# Patient Record
Sex: Female | Born: 1935 | Race: White | Hispanic: No | Smoking: Never smoker
Health system: Southern US, Community
[De-identification: ages and names within clinical notes are randomized; demographics above are authoritative.]

## PROBLEM LIST (undated history)

## (undated) DIAGNOSIS — Z8719 Personal history of other diseases of the digestive system: Secondary | ICD-10-CM

## (undated) DIAGNOSIS — C679 Malignant neoplasm of bladder, unspecified: Secondary | ICD-10-CM

## (undated) DIAGNOSIS — K5792 Diverticulitis of intestine, part unspecified, without perforation or abscess without bleeding: Secondary | ICD-10-CM

## (undated) DIAGNOSIS — R030 Elevated blood-pressure reading, without diagnosis of hypertension: Secondary | ICD-10-CM

## (undated) DIAGNOSIS — K219 Gastro-esophageal reflux disease without esophagitis: Secondary | ICD-10-CM

## (undated) DIAGNOSIS — I10 Essential (primary) hypertension: Secondary | ICD-10-CM

## (undated) HISTORY — DX: Gastro-esophageal reflux disease without esophagitis: K21.9

## (undated) HISTORY — PX: APPENDECTOMY: SHX54

## (undated) HISTORY — PX: ABDOMINAL HYSTERECTOMY: SHX81

---
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
## 1972-04-23 HISTORY — PX: DIAGNOSTIC LAPAROSCOPY: SUR761

## 1976-04-23 HISTORY — PX: ABDOMINAL HYSTERECTOMY: SHX81

## 2013-10-10 ENCOUNTER — Emergency Department (HOSPITAL_BASED_OUTPATIENT_CLINIC_OR_DEPARTMENT_OTHER): Payer: Medicare HMO

## 2013-10-10 ENCOUNTER — Emergency Department (HOSPITAL_BASED_OUTPATIENT_CLINIC_OR_DEPARTMENT_OTHER)
Admission: EM | Admit: 2013-10-10 | Discharge: 2013-10-10 | Disposition: A | Discharge status: Home / Self Care | Payer: Medicare HMO | Attending: Emergency Medicine | Admitting: Emergency Medicine

## 2013-10-10 ENCOUNTER — Encounter (HOSPITAL_BASED_OUTPATIENT_CLINIC_OR_DEPARTMENT_OTHER): Payer: Self-pay | Admitting: Emergency Medicine

## 2013-10-10 DIAGNOSIS — X500XXA Overexertion from strenuous movement or load, initial encounter: Secondary | ICD-10-CM | POA: Insufficient documentation

## 2013-10-10 DIAGNOSIS — S82899A Other fracture of unspecified lower leg, initial encounter for closed fracture: Secondary | ICD-10-CM | POA: Insufficient documentation

## 2013-10-10 DIAGNOSIS — Y9302 Activity, running: Secondary | ICD-10-CM | POA: Insufficient documentation

## 2013-10-10 DIAGNOSIS — S82401A Unspecified fracture of shaft of right fibula, initial encounter for closed fracture: Secondary | ICD-10-CM

## 2013-10-10 DIAGNOSIS — Y929 Unspecified place or not applicable: Secondary | ICD-10-CM | POA: Insufficient documentation

## 2013-10-10 HISTORY — DX: Unspecified fracture of shaft of right fibula, initial encounter for closed fracture: S82.401A

## 2013-10-10 MED ORDER — OXYCODONE-ACETAMINOPHEN 5-325 MG PO TABS
2.0000 | ORAL_TABLET | Freq: Once | ORAL | Status: AC
  Administered 2013-10-10: 1 via ORAL
  Filled 2013-10-10: qty 2

## 2013-10-10 MED ORDER — OXYCODONE-ACETAMINOPHEN 5-325 MG PO TABS: 2.0000 | ORAL_TABLET | ORAL | Status: DC | PRN

## 2013-10-10 NOTE — Discharge Instructions (Signed)
Cryotherapy Cryotherapy is when you put ice on your injury. Ice helps lessen pain and puffiness (swelling) after an injury. Ice works the best when you start using it in the first 24 to 48 hours after an injury. HOME CARE  Put a dry or damp towel between the ice pack and your skin.  You may press gently on the ice pack.  Leave the ice on for no more than 10 to 20 minutes at a time.  Check your skin after 5 minutes to make sure your skin is okay.  Rest at least 20 minutes between ice pack uses.  Stop using ice when your skin loses feeling (numbness).  Do not use ice on someone who cannot tell you when it hurts. This includes small children and people with memory problems (dementia). GET HELP RIGHT AWAY IF:  You have white spots on your skin.  Your skin turns blue or pale.  Your skin feels waxy or hard.  Your puffiness gets worse. MAKE SURE YOU:   Understand these instructions.  Will watch your condition.  Will get help right away if you are not doing well or get worse. Document Released: 09/26/2007 Document Revised: 07/02/2011 Document Reviewed: 11/30/2010 [facility] Patient Information 2015 [place], [place]. This information is not intended to replace advice given to you by your health care provider. Make sure you discuss any questions you have with your health care provider. Fibular Fracture, Ankle, Adult, Treated With or Without Immobilization A fibular fracture at your ankle is a break (fracture) bone in the smallest of the two bones in your lower leg, located on the outside of your leg (fibula) close to the area at your ankle joint. CAUSES  Rolling your ankle.  Twisting your ankle.  Extreme flexing or extending of your foot.  Severe force on your ankle as when falling from a distance. RISK FACTORS  Jumping activities.  Participation in sports.  Osteoporosis.  Advanced age.  Previous ankle injuries. SIGNS AND SYMPTOMS  Pain.  Swelling.  Inability to put  weight on injured ankle.  Bruising.  Bone deformities at site of injury. DIAGNOSIS  This fracture is diagnosed with the help of an X-Blandina Renaldo exam. TREATMENT  If the fractured bone did not move out of place it usually will heal without problems and does casting or splinting. If immobilization is needed for comfort or the fractured bone moved out of place and will not heal properly with immobilization, a cast or splint will be used. HOME CARE INSTRUCTIONS   Apply ice to the area of injury:  Put ice in a plastic bag.  Place a towel between your skin and the bag.  Leave the ice on for 20 minutes, 2-3 times a day.  Use crutches as directed. Resume walking without crutches as directed by your health care provider.  Only take over-the-counter or prescription medicines for pain, discomfort, or fever as directed by your health care provider.  If you have a removable splint or boot, do not remove the boot unless directed by your health care provider. SEEK MEDICAL CARE IF:   You have continued pain or more swelling  The medications do not control the pain. SEEK IMMEDIATE MEDICAL CARE IF:  You develop severe pain in the leg or foot.  Your skin or nails below the injury turn blue or grey or feel cold or numb. MAKE SURE YOU:   Understand these instructions.  Will watch your condition.  Will get help right away if you are not doing well  or get worse. Document Released: 04/09/2005 Document Revised: 01/28/2013 Document Reviewed: 11/19/2012 [facility] Patient Information 2015 [place], [place]. This information is not intended to replace advice given to you by your health care provider. Make sure you discuss any questions you have with your health care provider.

## 2013-10-10 NOTE — ED Provider Notes (Signed)
CSN: [id number]     Arrival date & time 10/10/13  1659 History   First MD Initiated Contact with Patient 10/10/13 1901     Chief Complaint  Patient presents with  . Fall  . Ankle Pain     (Consider location/radiation/quality/duration/timing/severity/associated sxs/prior Treatment) HPI  80 year Old female who was running to get out of the rain when she twisted her right ankle. She has swelling and pain to the lateral aspect of the right ankle. She has no other injury. It is worse with trying to bear weight on it. There is no trauma to the skin. She denies any previous medical history. She took ibuprofen prior to evaluation. She states the pain is worse when tried to walk on it and is not severe when lying with that elevated.     No past medical history on file. Past Surgical History  Procedure Laterality Date  . Abdominal hysterectomy    . Appendectomy     No family history on file. History  Substance Use Topics  . Smoking status: Never Smoker   . Smokeless tobacco: Not on file  . Alcohol Use: Yes   OB History   Grav Para Term Preterm Abortions TAB SAB Ect Mult Living                 Review of Systems  All other systems reviewed and are negative.     Allergies  Review of patient's allergies indicates no known allergies.  Home Medications   Prior to Admission medications   Not on File   BP 190/77  Pulse 84  Temp(Src) 98.3 F (36.8 C) (Oral)  Resp 18  SpO2 95% Physical Exam  Nursing note and vitals reviewed. Constitutional: She is oriented to person, place, and time. She appears well-developed and well-nourished.  HENT:  Head: Normocephalic and atraumatic.  Right Ear: External ear normal.  Left Ear: External ear normal.  Nose: Nose normal.  Mouth/Throat: Oropharynx is clear and moist.  Eyes: Pupils are equal, round, and reactive to light.  Neck: Normal range of motion.  Cardiovascular: Normal rate.   Pulmonary/Chest: Effort normal.  Abdominal: Soft.   Musculoskeletal:  Right ankle with swelling and tenderness palpation on lateral aspect. Dorsal pedalis pulses are intact. Toes are pink with capillary refill intact. Sensation is intact. Knee exam is normal with full active range of motion.  Neurological: She is alert and oriented to person, place, and time. She has normal reflexes.  Skin: Skin is warm and dry.  Psychiatric: She has a normal mood and affect. Her behavior is normal. Judgment and thought content normal.    ED Course  Procedures (including critical care time) Labs Review Labs Reviewed - No data to display  Imaging Review Dg Ankle Complete Right  10/10/2013   CLINICAL DATA:  Twisted right ankle  EXAM: RIGHT ANKLE - COMPLETE 3+ VIEW  COMPARISON:  None  FINDINGS: There is a oblique fracture through the distal fibula. The ankle mortise intact. Talar dome is normal. There is soft tissue swelling over the lateral malleolus.  IMPRESSION: Oblique fracture of the distal fibula.  No dislocation.   Electronically Signed   By: Suzy Bouchard M.D.   On: 10/10/2013 17:58     EKG Interpretation None      MDM   Final diagnoses:  Closed fracture of right fibula, initial encounter        Shaune Pollack, MD 10/10/13 1930

## 2013-10-10 NOTE — ED Notes (Signed)
Pt twisted right ankle while wearing flip-flops in the rain today. Reports pain in right ankle and leg

## 2013-10-10 NOTE — ED Notes (Signed)
Rx x 1 given for percocet and for a walker- d/c home with family

## 2013-10-10 NOTE — ED Notes (Signed)
I applied a short leg posterior splint with a stirrup application for added support. I used sock material, then thick wrap of cotton webril, then fiberglass material. I wrapped large kerlix to hold all in place, then used ace wrap to secure. I held foot/ankle in proper anatomical position until fiberglass set up. I fit, adjusted, and completed crutch training. Patient ready for discharge.

## 2013-10-14 ENCOUNTER — Ambulatory Visit (INDEPENDENT_AMBULATORY_CARE_PROVIDER_SITE_OTHER): Payer: Medicare HMO | Admitting: Family Medicine

## 2013-10-14 ENCOUNTER — Encounter: Payer: Self-pay | Admitting: Family Medicine

## 2013-10-14 VITALS — BP 168/100 | HR 91 | Ht 63.0 in | Wt 158.0 lb

## 2013-10-14 DIAGNOSIS — S82899A Other fracture of unspecified lower leg, initial encounter for closed fracture: Secondary | ICD-10-CM

## 2013-10-14 DIAGNOSIS — S82839A Other fracture of upper and lower end of unspecified fibula, initial encounter for closed fracture: Secondary | ICD-10-CM

## 2013-10-14 NOTE — Patient Instructions (Signed)
Wear cam walker regularly except when icing, bathing (ok to take off if you're lying on the couch and propping this up too). Icing 15 minutes at a time 3-4 times a day. Advil, percocet as you have been. Follow up with me in 2 weeks for reevaluation, to repeat x-rays. Do not put any weight on this leg for at least 6 weeks.

## 2013-10-19 ENCOUNTER — Encounter: Payer: Self-pay | Admitting: Family Medicine

## 2013-10-19 DIAGNOSIS — S82839A Other fracture of upper and lower end of unspecified fibula, initial encounter for closed fracture: Secondary | ICD-10-CM | POA: Insufficient documentation

## 2013-10-19 NOTE — Assessment & Plan Note (Signed)
Right distal fibula fracture - discussed this is concerning it is above the level of the ankle joint - higher risk of nonunion.  Switch to cam walker and advised not to weight bear.  Icing, elevation, nsaids with percocet as needed. Has a wheelchair - plans to use this.  F/u in 2 weeks to reevaluate, repeat x-rays.

## 2013-10-19 NOTE — Progress Notes (Signed)
Patient ID: Brandy Wheeler, female   DOB: 12-14-1931, 80 y.o.   MRN: [id number]  PCP: No PCP Per Patient  Subjective:   HPI: Patient is a 80 y.o. female here for right leg injury.  Patient reports she was in the garage on 6/20 when she slipped in flip flops on a wet floor and inverted her right ankle. Couldn't bear weight after this. Went to ED - radiographs showed a distal fibula spiral fracture above the level of the ankle joint. + swelling and bruising. Taking advil, wearing splint and not weight bearing. Given percocet but only took a few of these. No prior injuries to this part of leg, ankle.  Past Medical History  Diagnosis Date  . GERD (gastroesophageal reflux disease)     Current Outpatient Prescriptions on File Prior to Visit  Medication Sig Dispense Refill  . oxyCODONE-acetaminophen (PERCOCET/ROXICET) 5-325 MG per tablet Take 2 tablets by mouth every 4 (four) hours as needed for severe pain.  15 tablet  0   No current facility-administered medications on file prior to visit.    Past Surgical History  Procedure Laterality Date  . Abdominal hysterectomy    . Appendectomy      No Known Allergies  History   Social History  . Marital Status: Divorced    Spouse Name: N/A    Number of Children: N/A  . Years of Education: N/A   Occupational History  . Not on file.   Social History Main Topics  . Smoking status: Never Smoker   . Smokeless tobacco: Not on file  . Alcohol Use: Yes  . Drug Use: No  . Sexual Activity: Not on file   Other Topics Concern  . Not on file   Social History Narrative  . No narrative on file    No family history on file.  BP 168/100  Pulse 91  Ht 5\' 3"  (1.6 m)  Wt 158 lb (71.668 kg)  BMI 28.00 kg/m2  Review of Systems: See HPI above.    Objective:  Physical Exam:  Gen: NAD  Right ankle/lower leg: Mild swelling bruising.  No other deformity. Mild limitation ROM all directions. TTP distal fibula.  No other  tenderness. Negative ant drawer and talar tilt.   Pain with syndesmotic compression. Thompsons test negative. NV intact distally.    Assessment & Plan:  1. Right distal fibula fracture - discussed this is concerning it is above the level of the ankle joint - higher risk of nonunion.  Switch to cam walker and advised not to weight bear.  Icing, elevation, nsaids with percocet as needed. Has a wheelchair - plans to use this.  F/u in 2 weeks to reevaluate, repeat x-rays.

## 2013-10-27 ENCOUNTER — Ambulatory Visit (INDEPENDENT_AMBULATORY_CARE_PROVIDER_SITE_OTHER): Payer: Medicare HMO | Admitting: Family Medicine

## 2013-10-27 ENCOUNTER — Ambulatory Visit (HOSPITAL_BASED_OUTPATIENT_CLINIC_OR_DEPARTMENT_OTHER)
Admission: RE | Admit: 2013-10-27 | Discharge: 2013-10-27 | Disposition: A | Discharge status: Home / Self Care | Payer: Medicare HMO | Source: Ambulatory Visit | Attending: Family Medicine | Admitting: Family Medicine

## 2013-10-27 ENCOUNTER — Encounter: Payer: Self-pay | Admitting: Family Medicine

## 2013-10-27 VITALS — BP 168/90 | HR 92 | Ht 63.0 in | Wt 158.0 lb

## 2013-10-27 DIAGNOSIS — S82839A Other fracture of upper and lower end of unspecified fibula, initial encounter for closed fracture: Secondary | ICD-10-CM

## 2013-10-27 DIAGNOSIS — S82409A Unspecified fracture of shaft of unspecified fibula, initial encounter for closed fracture: Secondary | ICD-10-CM | POA: Insufficient documentation

## 2013-10-27 DIAGNOSIS — X58XXXA Exposure to other specified factors, initial encounter: Secondary | ICD-10-CM | POA: Insufficient documentation

## 2013-10-27 DIAGNOSIS — S82899A Other fracture of unspecified lower leg, initial encounter for closed fracture: Secondary | ICD-10-CM

## 2013-10-27 NOTE — Assessment & Plan Note (Signed)
Right distal fibula fracture - discussed this is concerning it is above the level of the ankle joint - higher risk of nonunion.  Repeat radiographs unchanged.  Much better clinically.  Discussed risks of not using the cam walker or a cast - increased risk of reinjury, possible surgery.  She would like to go without one though because it is causing pain (I believe cast would have similar issue).  Continue non weight bearing status.  Icing, elevation, nsaids as needed.   F/u in 4 weeks, repeat radiographs.

## 2013-10-27 NOTE — Patient Instructions (Signed)
Follow up with me in 4 weeks. No weight bearing still until you see me back. Icing as needed.  Elevation as needed for swelling.

## 2013-10-27 NOTE — Progress Notes (Signed)
Patient ID: Brandy Wheeler, female   DOB: 11/25/1931, 80 y.o.   MRN: [id number]  PCP: No PCP Per Patient  Subjective:   HPI: Patient is a 80 y.o. female here for right leg injury.  6/24: Patient reports she was in the garage on 6/20 when she slipped in flip flops on a wet floor and inverted her right ankle. Couldn't bear weight after this. Went to ED - radiographs showed a distal fibula spiral fracture above the level of the ankle joint. + swelling and bruising. Taking advil, wearing splint and not weight bearing. Given percocet but only took a few of these. No prior injuries to this part of leg, ankle.  7/7: Patient reports her leg feels much better. Still dealing with swelling. Back starting to bother her when wearing cam walker so stopped using this. Not weight bearing. Using wheelchair. Not needing pain medication.  Past Medical History  Diagnosis Date  . GERD (gastroesophageal reflux disease)     Current Outpatient Prescriptions on File Prior to Visit  Medication Sig Dispense Refill  . omeprazole (PRILOSEC) 10 MG capsule Take 10 mg by mouth daily.      Marland Kitchen oxyCODONE-acetaminophen (PERCOCET/ROXICET) 5-325 MG per tablet Take 2 tablets by mouth every 4 (four) hours as needed for severe pain.  15 tablet  0   No current facility-administered medications on file prior to visit.    Past Surgical History  Procedure Laterality Date  . Abdominal hysterectomy    . Appendectomy      No Known Allergies  History   Social History  . Marital Status: Divorced    Spouse Name: N/A    Number of Children: N/A  . Years of Education: N/A   Occupational History  . Not on file.   Social History Main Topics  . Smoking status: Never Smoker   . Smokeless tobacco: Not on file  . Alcohol Use: Yes  . Drug Use: No  . Sexual Activity: Not on file   Other Topics Concern  . Not on file   Social History Narrative  . No narrative on file    No family history on file.  BP 168/90   Pulse 92  Ht 5\' 3"  (1.6 m)  Wt 158 lb (71.668 kg)  BMI 28.00 kg/m2  Review of Systems: See HPI above.    Objective:  Physical Exam:  Gen: NAD  Right ankle/lower leg: Mild swelling, no bruising.  No other deformity. Mild limitation ROM all directions. Mild TTP distal fibula.  No other tenderness. Negative ant drawer and talar tilt.   Thompsons test negative. NV intact distally.    Assessment & Plan:  1. Right distal fibula fracture - discussed this is concerning it is above the level of the ankle joint - higher risk of nonunion.  Repeat radiographs unchanged.  Much better clinically.  Discussed risks of not using the cam walker or a cast - increased risk of reinjury, possible surgery.  She would like to go without one though because it is causing pain (I believe cast would have similar issue).  Continue non weight bearing status.  Icing, elevation, nsaids as needed.   F/u in 4 weeks, repeat radiographs.

## 2013-11-23 ENCOUNTER — Ambulatory Visit (INDEPENDENT_AMBULATORY_CARE_PROVIDER_SITE_OTHER): Payer: Medicare HMO | Admitting: Family Medicine

## 2013-11-23 ENCOUNTER — Ambulatory Visit (HOSPITAL_BASED_OUTPATIENT_CLINIC_OR_DEPARTMENT_OTHER)
Admission: RE | Admit: 2013-11-23 | Discharge: 2013-11-23 | Disposition: A | Discharge status: Home / Self Care | Payer: Medicare HMO | Source: Ambulatory Visit | Attending: Family Medicine | Admitting: Family Medicine

## 2013-11-23 ENCOUNTER — Encounter: Payer: Self-pay | Admitting: Family Medicine

## 2013-11-23 VITALS — BP 166/81 | HR 90 | Ht 63.0 in | Wt 151.0 lb

## 2013-11-23 DIAGNOSIS — S99911D Unspecified injury of right ankle, subsequent encounter: Secondary | ICD-10-CM

## 2013-11-23 DIAGNOSIS — Z5189 Encounter for other specified aftercare: Secondary | ICD-10-CM | POA: Diagnosis present

## 2013-11-23 DIAGNOSIS — S82839A Other fracture of upper and lower end of unspecified fibula, initial encounter for closed fracture: Secondary | ICD-10-CM

## 2013-11-23 DIAGNOSIS — S99929A Unspecified injury of unspecified foot, initial encounter: Secondary | ICD-10-CM

## 2013-11-23 DIAGNOSIS — S99919A Unspecified injury of unspecified ankle, initial encounter: Secondary | ICD-10-CM

## 2013-11-23 DIAGNOSIS — S8990XA Unspecified injury of unspecified lower leg, initial encounter: Secondary | ICD-10-CM | POA: Diagnosis present

## 2013-11-23 DIAGNOSIS — S82899A Other fracture of unspecified lower leg, initial encounter for closed fracture: Secondary | ICD-10-CM

## 2013-11-23 NOTE — Patient Instructions (Signed)
Wear the wrap or laceup brace for 2 more weeks then as needed beyond this. Call me if you have any issues otherwise follow-up as needed.

## 2013-11-24 ENCOUNTER — Encounter: Payer: Self-pay | Admitting: Family Medicine

## 2013-11-24 NOTE — Assessment & Plan Note (Signed)
Right distal fibula fracture - repeat radiographs show some interval healing.  Additionally she has no pain and minimal tenderness, clinically doing well.  She will wear ACE wrap or ASO for support for 2 more weeks then as needed beyond this. Call with any concerns otherwise f/u prn.

## 2013-11-24 NOTE — Progress Notes (Signed)
Patient ID: Brandy Wheeler, female   DOB: [birth year], 80 y.o.   MRN: [id number]  PCP: No PCP Per Patient  Subjective:   HPI: Patient is a 80 y.o. female here for right leg injury.  6/24: Patient reports she was in the garage on 6/20 when she slipped in flip flops on a wet floor and inverted her right ankle. Couldn't bear weight after this. Went to ED - radiographs showed a distal fibula spiral fracture above the level of the ankle joint. + swelling and bruising. Taking advil, wearing splint and not weight bearing. Given percocet but only took a few of these. No prior injuries to this part of leg, ankle.  7/7: Patient reports her leg feels much better. Still dealing with swelling. Back starting to bother her when wearing cam walker so stopped using this. Not weight bearing. Using wheelchair. Not needing pain medication.  8/3: Patient reports she is doing well. A little weakness but no pain. Some swelling. Continues to wear ace wrap for support. No other complaints. Not taking any medicine for pain.  Past Medical History  Diagnosis Date  . GERD (gastroesophageal reflux disease)     Current Outpatient Prescriptions on File Prior to Visit  Medication Sig Dispense Refill  . omeprazole (PRILOSEC) 10 MG capsule Take 10 mg by mouth daily.      Marland Kitchen oxyCODONE-acetaminophen (PERCOCET/ROXICET) 5-325 MG per tablet Take 2 tablets by mouth every 4 (four) hours as needed for severe pain.  15 tablet  0   No current facility-administered medications on file prior to visit.    Past Surgical History  Procedure Laterality Date  . Abdominal hysterectomy    . Appendectomy      No Known Allergies  History   Social History  . Marital Status: Divorced    Spouse Name: N/A    Number of Children: N/A  . Years of Education: N/A   Occupational History  . Not on file.   Social History Main Topics  . Smoking status: Never Smoker   . Smokeless tobacco: Not on file  . Alcohol Use: Yes  .  Drug Use: No  . Sexual Activity: Not on file   Other Topics Concern  . Not on file   Social History Narrative  . No narrative on file    No family history on file.  BP 166/81  Pulse 90  Ht 5\' 3"  (1.6 m)  Wt 151 lb (68.493 kg)  BMI 26.76 kg/m2  Review of Systems: See HPI above.    Objective:  Physical Exam:  Gen: NAD  Right ankle/lower leg: Mild swelling, no bruising.  No other deformity. FROM. Minimal TTP distal fibula.  No other tenderness. Negative ant drawer and talar tilt.   Thompsons test negative. NV intact distally.    Assessment & Plan:  1. Right distal fibula fracture - repeat radiographs show some interval healing.  Additionally she has no pain and minimal tenderness, clinically doing well.  She will wear ACE wrap or ASO for support for 2 more weeks then as needed beyond this. Call with any concerns otherwise f/u prn.

## 2020-04-20 ENCOUNTER — Emergency Department (HOSPITAL_BASED_OUTPATIENT_CLINIC_OR_DEPARTMENT_OTHER)
Admission: EM | Admit: 2020-04-20 | Discharge: 2020-04-20 | Disposition: A | Discharge status: Home / Self Care | Payer: Medicare HMO | Attending: Emergency Medicine | Admitting: Emergency Medicine

## 2020-04-20 ENCOUNTER — Emergency Department (HOSPITAL_BASED_OUTPATIENT_CLINIC_OR_DEPARTMENT_OTHER): Payer: Medicare HMO

## 2020-04-20 ENCOUNTER — Other Ambulatory Visit: Payer: Self-pay

## 2020-04-20 ENCOUNTER — Encounter (HOSPITAL_BASED_OUTPATIENT_CLINIC_OR_DEPARTMENT_OTHER): Payer: Self-pay | Admitting: Emergency Medicine

## 2020-04-20 DIAGNOSIS — Z8719 Personal history of other diseases of the digestive system: Secondary | ICD-10-CM | POA: Insufficient documentation

## 2020-04-20 DIAGNOSIS — N3090 Cystitis, unspecified without hematuria: Secondary | ICD-10-CM | POA: Insufficient documentation

## 2020-04-20 DIAGNOSIS — R1032 Left lower quadrant pain: Secondary | ICD-10-CM | POA: Diagnosis present

## 2020-04-20 DIAGNOSIS — K219 Gastro-esophageal reflux disease without esophagitis: Secondary | ICD-10-CM | POA: Insufficient documentation

## 2020-04-20 DIAGNOSIS — Z9049 Acquired absence of other specified parts of digestive tract: Secondary | ICD-10-CM | POA: Diagnosis not present

## 2020-04-20 HISTORY — DX: Diverticulitis of intestine, part unspecified, without perforation or abscess without bleeding: K57.92

## 2020-04-20 LAB — CBC
HCT: 46.2 % — ABNORMAL HIGH (ref 36.0–46.0)
Hemoglobin: 14.9 g/dL (ref 12.0–15.0)
MCH: 30.1 pg (ref 26.0–34.0)
MCHC: 32.3 g/dL (ref 30.0–36.0)
MCV: 93.3 fL (ref 80.0–100.0)
Platelets: 227 10*3/uL (ref 150–400)
RBC: 4.95 MIL/uL (ref 3.87–5.11)
RDW: 13.5 % (ref 11.5–15.5)
WBC: 4.8 10*3/uL (ref 4.0–10.5)
nRBC: 0 % (ref 0.0–0.2)

## 2020-04-20 LAB — COMPREHENSIVE METABOLIC PANEL
ALT: 19 U/L (ref 0–44)
AST: 25 U/L (ref 15–41)
Albumin: 3.9 g/dL (ref 3.5–5.0)
Alkaline Phosphatase: 65 U/L (ref 38–126)
Anion gap: 11 (ref 5–15)
BUN: 15 mg/dL (ref 8–23)
CO2: 26 mmol/L (ref 22–32)
Calcium: 8.6 mg/dL — ABNORMAL LOW (ref 8.9–10.3)
Chloride: 100 mmol/L (ref 98–111)
Creatinine, Ser: 0.92 mg/dL (ref 0.44–1.00)
GFR, Estimated: >60 mL/min (ref >60)
Glucose, Bld: 115 mg/dL — ABNORMAL HIGH (ref 70–99)
Potassium: 4.1 mmol/L (ref 3.5–5.1)
Sodium: 137 mmol/L (ref 135–145)
Total Bilirubin: 0.5 mg/dL (ref 0.3–1.2)
Total Protein: 6.9 g/dL (ref 6.5–8.1)

## 2020-04-20 LAB — URINALYSIS, ROUTINE W REFLEX MICROSCOPIC
Bilirubin Urine: NEGATIVE
Glucose, UA: NEGATIVE mg/dL
Hgb urine dipstick: NEGATIVE
Ketones, ur: NEGATIVE mg/dL
Leukocytes,Ua: NEGATIVE
Nitrite: NEGATIVE
Protein, ur: NEGATIVE mg/dL
Specific Gravity, Urine: 1.01 (ref 1.005–1.030)
pH: 6 (ref 5.0–8.0)

## 2020-04-20 LAB — LIPASE, BLOOD: Lipase: 50 U/L (ref 11–51)

## 2020-04-20 MED ORDER — CEPHALEXIN 500 MG PO CAPS
500.0000 mg | ORAL_CAPSULE | Freq: Three times a day (TID) | ORAL | 0 refills | Status: AC
Start: 2020-04-20 — End: 2020-04-27

## 2020-04-20 MED ORDER — IOHEXOL 300 MG/ML  SOLN
100.0000 mL | Freq: Once | INTRAMUSCULAR | Status: AC | PRN
  Administered 2020-04-20: 100 mL via INTRAVENOUS

## 2020-04-20 NOTE — ED Notes (Signed)
Patient transported to CT 

## 2020-04-20 NOTE — Discharge Instructions (Addendum)
You were evaluated in the Emergency Department and after careful evaluation, we did not find any emergent condition requiring admission or further testing in the hospital.  Your exam/testing today was overall reassuring.  Your CT did not show any signs of diverticulitis.  There is some evidence of inflammation of the bladder.  Please take the antibiotics as directed, Tylenol for discomfort.  Follow-up closely with your primary care doctor to discuss your high blood pressure.  Please return to the Emergency Department if you experience any worsening of your condition.  Thank you for allowing [place] to be a part of your care.

## 2020-04-20 NOTE — ED Triage Notes (Signed)
Left sided abd pain x 2 days has hx of diverticulitis states was told she may ed a ct scan some nausea

## 2020-04-20 NOTE — ED Provider Notes (Signed)
[facility] Emergency Department Provider Note MRN:  [id number]  Arrival date & time: 04/20/20     Chief Complaint   Abdominal Pain   History of Present Illness   Brandy Wheeler is a 86 y.o. year-old female with a history of diverticulitis presenting to the ED with chief complaint of abdominal pain.  Location: Left lower quadrant Duration: 2 days Onset: Gradual Timing: Intermittent Description: Dull Severity: Mild to moderate Exacerbating/Alleviating Factors: Worse with motion or palpation Associated Symptoms: Denies fever, no nausea, no vomiting, no diarrhea, no constipation   Review of Systems  A complete 10 system review of systems was obtained and all systems are negative except as noted in the HPI and PMH.   Patient's Health History    Past Medical History:  Diagnosis Date  . Diverticulitis   . GERD (gastroesophageal reflux disease)     Past Surgical History:  Procedure Laterality Date  . ABDOMINAL HYSTERECTOMY    . APPENDECTOMY      No family history on file.  Social History   Socioeconomic History  . Marital status: Divorced    Spouse name: Not on file  . Number of children: Not on file  . Years of education: Not on file  . Highest education level: Not on file  Occupational History  . Not on file  Tobacco Use  . Smoking status: Never Smoker  . Smokeless tobacco: Never Used  Substance and Sexual Activity  . Alcohol use: Yes  . Drug use: No  . Sexual activity: Not on file  Other Topics Concern  . Not on file  Social History Narrative  . Not on file   Social Determinants of Health   Financial Resource Strain: Not on file  Food Insecurity: Not on file  Transportation Needs: Not on file  Physical Activity: Not on file  Stress: Not on file  Social Connections: Not on file  Intimate Partner Violence: Not on file     Physical Exam   Vitals:   04/20/20 0930 04/20/20 1049  BP: (!) 178/81 (!) 200/122  Pulse: 84 86   Resp: 16 16  Temp:    SpO2: 99% 94%    CONSTITUTIONAL: Well-appearing, NAD NEURO:  Alert and oriented x 3, no focal deficits EYES:  eyes equal and reactive ENT/NECK:  no LAD, no JVD CARDIO: Regular rate, well-perfused, normal S1 and S2 PULM:  CTAB no wheezing or rhonchi GI/GU:  normal bowel sounds, non-distended, non-tender MSK/SPINE:  No gross deformities, no edema SKIN:  no rash, atraumatic PSYCH:  Appropriate speech and behavior  *Additional and/or pertinent findings included in MDM below  Diagnostic and Interventional Summary    EKG Interpretation  Date/Time:    Ventricular Rate:    PR Interval:    QRS Duration:   QT Interval:    QTC Calculation:   R Axis:     Text Interpretation:        Labs Reviewed  CBC - Abnormal; Notable for the following components:      Result Value   HCT 46.2 (*)    All other components within normal limits  COMPREHENSIVE METABOLIC PANEL - Abnormal; Notable for the following components:   Glucose, Bld 115 (*)    Calcium 8.6 (*)    All other components within normal limits  URINE CULTURE  LIPASE, BLOOD  URINALYSIS, ROUTINE W REFLEX MICROSCOPIC    CT ABDOMEN PELVIS W CONTRAST  Final Result      Medications  iohexol (OMNIPAQUE) 300  MG/ML solution 100 mL (100 mLs Intravenous Contrast Given 04/20/20 1003)     Procedures  /  Critical Care Procedures  ED Course and Medical Decision Making  I have reviewed the triage vital signs, the nursing notes, and pertinent available records from the EMR.  Listed above are laboratory and imaging tests that I personally ordered, reviewed, and interpreted and then considered in my medical decision making (see below for details).  CT to evaluate for complications of diverticulitis     CT is without evidence of diverticulitis.  There is some stranding near the bladder and right collecting system.  This is peculiar given the normal-appearing urine on urinalysis.  Given her pain and these CT  findings, will cover with antibiotics for cystitis.  Urine sent for culture.  Patient is hypertensive in the emergency department but with no headache or vision change or chest pain, she has not seen a PCP in 3 years, advised close PCP follow-up.  Elmer Sow. Pilar Plate, MD [facility] Emergency Medicine [facility] [email or web address]  Final Clinical Impressions(s) / ED Diagnoses     ICD-10-CM   1. Cystitis  N30.90     ED Discharge Orders         Ordered    cephALEXin (KEFLEX) 500 MG capsule  3 times daily        04/20/20 1107           Discharge Instructions Discussed with and Provided to Patient:     Discharge Instructions     You were evaluated in the Emergency Department and after careful evaluation, we did not find any emergent condition requiring admission or further testing in the hospital.  Your exam/testing today was overall reassuring.  Your CT did not show any signs of diverticulitis.  There is some evidence of inflammation of the bladder.  Please take the antibiotics as directed, Tylenol for discomfort.  Follow-up closely with your primary care doctor to discuss your high blood pressure.  Please return to the Emergency Department if you experience any worsening of your condition.  Thank you for allowing [place] to be a part of your care.        Sabas Sous, MD 04/20/20 1109

## 2020-04-21 LAB — URINE CULTURE: Culture: <10000 — AB

## 2020-04-27 ENCOUNTER — Emergency Department (HOSPITAL_BASED_OUTPATIENT_CLINIC_OR_DEPARTMENT_OTHER)
Admission: EM | Admit: 2020-04-27 | Discharge: 2020-04-27 | Disposition: A | Discharge status: Home / Self Care | Payer: Medicare HMO | Attending: Emergency Medicine | Admitting: Emergency Medicine

## 2020-04-27 ENCOUNTER — Other Ambulatory Visit: Payer: Self-pay

## 2020-04-27 ENCOUNTER — Encounter (HOSPITAL_BASED_OUTPATIENT_CLINIC_OR_DEPARTMENT_OTHER): Payer: Self-pay | Admitting: Emergency Medicine

## 2020-04-27 ENCOUNTER — Emergency Department (HOSPITAL_BASED_OUTPATIENT_CLINIC_OR_DEPARTMENT_OTHER): Payer: Medicare HMO

## 2020-04-27 DIAGNOSIS — R112 Nausea with vomiting, unspecified: Secondary | ICD-10-CM | POA: Insufficient documentation

## 2020-04-27 DIAGNOSIS — Z9049 Acquired absence of other specified parts of digestive tract: Secondary | ICD-10-CM | POA: Diagnosis not present

## 2020-04-27 DIAGNOSIS — U071 COVID-19: Secondary | ICD-10-CM | POA: Diagnosis not present

## 2020-04-27 DIAGNOSIS — N2882 Megaloureter: Secondary | ICD-10-CM | POA: Diagnosis not present

## 2020-04-27 DIAGNOSIS — R1084 Generalized abdominal pain: Secondary | ICD-10-CM | POA: Diagnosis not present

## 2020-04-27 DIAGNOSIS — Z20822 Contact with and (suspected) exposure to covid-19: Secondary | ICD-10-CM

## 2020-04-27 DIAGNOSIS — N3090 Cystitis, unspecified without hematuria: Secondary | ICD-10-CM | POA: Diagnosis not present

## 2020-04-27 DIAGNOSIS — K5730 Diverticulosis of large intestine without perforation or abscess without bleeding: Secondary | ICD-10-CM | POA: Insufficient documentation

## 2020-04-27 DIAGNOSIS — I700 Atherosclerosis of aorta: Secondary | ICD-10-CM | POA: Diagnosis not present

## 2020-04-27 DIAGNOSIS — N2889 Other specified disorders of kidney and ureter: Secondary | ICD-10-CM | POA: Diagnosis not present

## 2020-04-27 LAB — CBC
HCT: 45.2 % (ref 36.0–46.0)
Hemoglobin: 14.9 g/dL (ref 12.0–15.0)
MCH: 30.1 pg (ref 26.0–34.0)
MCHC: 33 g/dL (ref 30.0–36.0)
MCV: 91.3 fL (ref 80.0–100.0)
Platelets: 182 10*3/uL (ref 150–400)
RBC: 4.95 MIL/uL (ref 3.87–5.11)
RDW: 13.2 % (ref 11.5–15.5)
WBC: 4.8 10*3/uL (ref 4.0–10.5)
nRBC: 0 % (ref 0.0–0.2)

## 2020-04-27 LAB — URINALYSIS, ROUTINE W REFLEX MICROSCOPIC
Bilirubin Urine: NEGATIVE
Glucose, UA: NEGATIVE mg/dL
Ketones, ur: 15 mg/dL — AB
Nitrite: NEGATIVE
Protein, ur: 30 mg/dL — AB
Specific Gravity, Urine: 1.025 (ref 1.005–1.030)
pH: 6.5 (ref 5.0–8.0)

## 2020-04-27 LAB — COMPREHENSIVE METABOLIC PANEL
ALT: 16 U/L (ref 0–44)
AST: 26 U/L (ref 15–41)
Albumin: 3.8 g/dL (ref 3.5–5.0)
Alkaline Phosphatase: 63 U/L (ref 38–126)
Anion gap: 10 (ref 5–15)
BUN: 16 mg/dL (ref 8–23)
CO2: 28 mmol/L (ref 22–32)
Calcium: 8.5 mg/dL — ABNORMAL LOW (ref 8.9–10.3)
Chloride: 96 mmol/L — ABNORMAL LOW (ref 98–111)
Creatinine, Ser: 0.9 mg/dL (ref 0.44–1.00)
GFR, Estimated: >60 mL/min (ref >60)
Glucose, Bld: 112 mg/dL — ABNORMAL HIGH (ref 70–99)
Potassium: 3.9 mmol/L (ref 3.5–5.1)
Sodium: 134 mmol/L — ABNORMAL LOW (ref 135–145)
Total Bilirubin: 0.6 mg/dL (ref 0.3–1.2)
Total Protein: 6.8 g/dL (ref 6.5–8.1)

## 2020-04-27 LAB — URINALYSIS, MICROSCOPIC (REFLEX): RBC / HPF: >50 RBC/hpf (ref 0–5)

## 2020-04-27 LAB — LIPASE, BLOOD: Lipase: 45 U/L (ref 11–51)

## 2020-04-27 LAB — SARS CORONAVIRUS 2 (TAT 6-24 HRS): SARS Coronavirus 2: POSITIVE — AB

## 2020-04-27 LAB — LACTIC ACID, PLASMA: Lactic Acid, Venous: 0.9 mmol/L (ref 0.5–1.9)

## 2020-04-27 MED ORDER — SULFAMETHOXAZOLE-TRIMETHOPRIM 800-160 MG PO TABS: 1.0000 | ORAL_TABLET | Freq: Two times a day (BID) | ORAL | 0 refills | Status: AC

## 2020-04-27 MED ORDER — SODIUM CHLORIDE 0.9 % IV SOLN
1.0000 g | Freq: Once | INTRAVENOUS | Status: AC
  Administered 2020-04-27: 1 g via INTRAVENOUS
  Filled 2020-04-27: qty 10

## 2020-04-27 MED ORDER — OXYCODONE-ACETAMINOPHEN 5-325 MG PO TABS
1.0000 | ORAL_TABLET | Freq: Once | ORAL | Status: AC
Start: 2020-04-27 — End: 2020-04-27
  Administered 2020-04-27: 1 via ORAL
  Filled 2020-04-27: qty 1

## 2020-04-27 MED ORDER — FENTANYL CITRATE (PF) 100 MCG/2ML IJ SOLN
50.0000 ug | Freq: Once | INTRAMUSCULAR | Status: AC
  Administered 2020-04-27: 50 ug via INTRAVENOUS
  Filled 2020-04-27: qty 2

## 2020-04-27 MED ORDER — SODIUM CHLORIDE 0.9 % IV SOLN
INTRAVENOUS | Status: DC | PRN
  Administered 2020-04-27: 25 mL via INTRAVENOUS

## 2020-04-27 MED ORDER — SENNOSIDES-DOCUSATE SODIUM 8.6-50 MG PO TABS: 1.0000 | ORAL_TABLET | Freq: Every evening | ORAL | 0 refills | Status: DC | PRN

## 2020-04-27 MED ORDER — IOHEXOL 350 MG/ML SOLN
100.0000 mL | Freq: Once | INTRAVENOUS | Status: AC | PRN
  Administered 2020-04-27: 100 mL via INTRAVENOUS

## 2020-04-27 MED ORDER — OXYCODONE-ACETAMINOPHEN 5-325 MG PO TABS: 1.0000 | ORAL_TABLET | Freq: Four times a day (QID) | ORAL | 0 refills | Status: DC | PRN

## 2020-04-27 NOTE — Discharge Instructions (Signed)
You were seen in the emerge department today with abdominal pain.  I am sending over medications to the pharmacy for antibiotics as well as pain medicines.  These are stronger antibiotics than before and should treat your symptoms.  Your CT scan did show some developing pneumonia at the bases of your lungs.  This could be related to COVID-19 and we are testing you.  Please remain in quarantine and follow the test results in the MyChart app.  Return to the emergency department any new or suddenly worsening symptoms.

## 2020-04-27 NOTE — ED Notes (Signed)
Patient verbalized understanding of discharge instructions, ambulated to waiting room, gait steady

## 2020-04-27 NOTE — ED Triage Notes (Signed)
abd pain since last visit. Feel like Rx is making it worse, feels like gas as well.

## 2020-04-27 NOTE — ED Provider Notes (Signed)
Blood pressure (!) 180/85, pulse 95, temperature 98.2 F (36.8 C), temperature source Oral, resp. rate 17, height 5\' 3"  (1.6 m), weight 73.9 kg, SpO2 98 %.  Assuming care from Dr. .  In short, Brandy Wheeler is a 87 y.o. female with a chief complaint of Abdominal Pain .  Refer to the original H&P for additional details.  The current plan of care is to f/u on CT imaging.  08:15 AM  CT imaging reviewed.  No vascular findings.  Patient has similar inflammatory changes at the renal pelvis as seen on the prior CT.  No diverticulitis.  Lactic acid is normal.  No leukocytosis.  Patient does have many bacteria with leukocytes and 21-50 whites.  Plan to give IV Rocephin here and will discharge the patient home with Bactrim.  Renal function is normal and patient has no allergies.  Would prefer to avoid fluoroquinolones for now.  CT also showing multifocal pneumonia at the bases.  Patient is not having any pneumonia symptoms currently but states her daughter, with whom she lives, does have nasal congestion.  I will send the Covid PCR test and patient will follow the test results in MyChart.     88, MD 04/27/20 [phone]

## 2020-04-27 NOTE — ED Provider Notes (Incomplete)
Blood pressure (!) 180/85, pulse 95, temperature 98.2 F (36.8 C), temperature source Oral, resp. rate 17, height 5\' 3"  (1.6 m), weight 73.9 kg, SpO2 98 %.  Assuming care from Dr.  In short, Brandy Wheeler is a 86 y.o. female with a chief complaint of Abdominal Pain .  Refer to the original H&P for additional details.  The current plan of care is to ***.

## 2020-04-27 NOTE — ED Notes (Signed)
Patient ambulated to bathroom, gait stead

## 2020-04-27 NOTE — ED Provider Notes (Signed)
[facility] Emergency Department Provider Note MRN:  [id number]  Arrival date & time: 04/27/20     Chief Complaint   Abdominal Pain   History of Present Illness   Brandy Wheeler is a 87 y.o. year-old female with a history of GERD, diverticulitis presenting to the ED with chief complaint of abdominal pain.  Diffuse abdominal pain worsening over the past 5 days was seen in the emergency department and diagnosed with cystitis.  Denies fever.  Endorsing nausea and vomiting with this pain.  Seems to be worsening despite antibiotics.  Review of Systems  A complete 10 system review of systems was obtained and all systems are negative except as noted in the HPI and PMH.   Patient's Health History    Past Medical History:  Diagnosis Date  . Diverticulitis   . GERD (gastroesophageal reflux disease)     Past Surgical History:  Procedure Laterality Date  . ABDOMINAL HYSTERECTOMY    . APPENDECTOMY      History reviewed. No pertinent family history.  Social History   Socioeconomic History  . Marital status: Divorced    Spouse name: Not on file  . Number of children: Not on file  . Years of education: Not on file  . Highest education level: Not on file  Occupational History  . Not on file  Tobacco Use  . Smoking status: Never Smoker  . Smokeless tobacco: Never Used  Substance and Sexual Activity  . Alcohol use: Yes  . Drug use: No  . Sexual activity: Not on file  Other Topics Concern  . Not on file  Social History Narrative  . Not on file   Social Determinants of Health   Financial Resource Strain: Not on file  Food Insecurity: Not on file  Transportation Needs: Not on file  Physical Activity: Not on file  Stress: Not on file  Social Connections: Not on file  Intimate Partner Violence: Not on file     Physical Exam   Vitals:   04/27/20 0615  BP: (!) 164/88  Pulse: 83  Resp: 20  Temp: 98.4 F (36.9 C)  SpO2: 98%    CONSTITUTIONAL:  Well-appearing, NAD NEURO:  Alert and oriented x 3, no focal deficits EYES:  eyes equal and reactive ENT/NECK:  no LAD, no JVD CARDIO: Regular rate, well-perfused, normal S1 and S2 PULM:  CTAB no wheezing or rhonchi GI/GU:  normal bowel sounds, non-distended, non-tender MSK/SPINE:  No gross deformities, no edema SKIN:  no rash, atraumatic PSYCH:  Appropriate speech and behavior  *Additional and/or pertinent findings included in MDM below  Diagnostic and Interventional Summary    EKG Interpretation  Date/Time:    Ventricular Rate:    PR Interval:    QRS Duration:   QT Interval:    QTC Calculation:   R Axis:     Text Interpretation:        Labs Reviewed  CBC  COMPREHENSIVE METABOLIC PANEL  LIPASE, BLOOD  URINALYSIS, ROUTINE W REFLEX MICROSCOPIC  LACTIC ACID, PLASMA    CT Angio Abd/Pel W and/or Wo Contrast    (Results Pending)    Medications  fentaNYL (SUBLIMAZE) injection 50 mcg (has no administration in time range)     Procedures  /  Critical Care Procedures  ED Course and Medical Decision Making  I have reviewed the triage vital signs, the nursing notes, and pertinent available records from the EMR.  Listed above are laboratory and imaging tests that I personally ordered,  reviewed, and interpreted and then considered in my medical decision making (see below for details).  Diffuse tenderness, will need to repeat CT to exclude early diverticulitis that has progressed or worsened, worsening dilation of the collecting system, mesenteric ischemia.  Signed out to oncoming provider at shift change.       Elmer Sow. Pilar Plate, MD [facility] Emergency Medicine [facility] [email or web address]  Final Clinical Impressions(s) / ED Diagnoses     ICD-10-CM   1. Generalized abdominal pain  R10.84     ED Discharge Orders    None       Discharge Instructions Discussed with and Provided to Patient:   Discharge Instructions   None       Sabas Sous, MD 04/27/20 [phone]

## 2020-04-30 ENCOUNTER — Other Ambulatory Visit: Payer: Self-pay

## 2020-04-30 ENCOUNTER — Encounter (HOSPITAL_BASED_OUTPATIENT_CLINIC_OR_DEPARTMENT_OTHER): Payer: Self-pay | Admitting: Emergency Medicine

## 2020-04-30 ENCOUNTER — Emergency Department (HOSPITAL_BASED_OUTPATIENT_CLINIC_OR_DEPARTMENT_OTHER)
Admission: EM | Admit: 2020-04-30 | Discharge: 2020-04-30 | Disposition: A | Discharge status: Home / Self Care | Payer: Medicare HMO | Attending: Emergency Medicine | Admitting: Emergency Medicine

## 2020-04-30 DIAGNOSIS — E860 Dehydration: Secondary | ICD-10-CM | POA: Diagnosis not present

## 2020-04-30 DIAGNOSIS — U071 COVID-19: Secondary | ICD-10-CM

## 2020-04-30 DIAGNOSIS — R110 Nausea: Secondary | ICD-10-CM | POA: Diagnosis not present

## 2020-04-30 DIAGNOSIS — E871 Hypo-osmolality and hyponatremia: Secondary | ICD-10-CM

## 2020-04-30 LAB — COMPREHENSIVE METABOLIC PANEL
ALT: 16 U/L (ref 0–44)
AST: 29 U/L (ref 15–41)
Albumin: 3.7 g/dL (ref 3.5–5.0)
Alkaline Phosphatase: 66 U/L (ref 38–126)
Anion gap: 11 (ref 5–15)
BUN: 16 mg/dL (ref 8–23)
CO2: 24 mmol/L (ref 22–32)
Calcium: 8.5 mg/dL — ABNORMAL LOW (ref 8.9–10.3)
Chloride: 91 mmol/L — ABNORMAL LOW (ref 98–111)
Creatinine, Ser: 1.06 mg/dL — ABNORMAL HIGH (ref 0.44–1.00)
GFR, Estimated: 51 mL/min — ABNORMAL LOW (ref >60)
Glucose, Bld: 124 mg/dL — ABNORMAL HIGH (ref 70–99)
Potassium: 4.2 mmol/L (ref 3.5–5.1)
Sodium: 126 mmol/L — ABNORMAL LOW (ref 135–145)
Total Bilirubin: 0.5 mg/dL (ref 0.3–1.2)
Total Protein: 6.7 g/dL (ref 6.5–8.1)

## 2020-04-30 LAB — CBC
HCT: 41.8 % (ref 36.0–46.0)
Hemoglobin: 14.2 g/dL (ref 12.0–15.0)
MCH: 30.1 pg (ref 26.0–34.0)
MCHC: 34 g/dL (ref 30.0–36.0)
MCV: 88.7 fL (ref 80.0–100.0)
Platelets: 215 10*3/uL (ref 150–400)
RBC: 4.71 MIL/uL (ref 3.87–5.11)
RDW: 12.5 % (ref 11.5–15.5)
WBC: 6 10*3/uL (ref 4.0–10.5)
nRBC: 0 % (ref 0.0–0.2)

## 2020-04-30 LAB — BASIC METABOLIC PANEL
Anion gap: 9 (ref 5–15)
BUN: 12 mg/dL (ref 8–23)
CO2: 22 mmol/L (ref 22–32)
Calcium: 8.4 mg/dL — ABNORMAL LOW (ref 8.9–10.3)
Chloride: 101 mmol/L (ref 98–111)
Creatinine, Ser: 0.95 mg/dL (ref 0.44–1.00)
GFR, Estimated: 58 mL/min — ABNORMAL LOW (ref >60)
Glucose, Bld: 113 mg/dL — ABNORMAL HIGH (ref 70–99)
Potassium: 4 mmol/L (ref 3.5–5.1)
Sodium: 132 mmol/L — ABNORMAL LOW (ref 135–145)

## 2020-04-30 MED ORDER — SODIUM CHLORIDE 0.9 % IV BOLUS
1000.0000 mL | Freq: Once | INTRAVENOUS | Status: AC
  Administered 2020-04-30: 1000 mL via INTRAVENOUS

## 2020-04-30 MED ORDER — DIPHENHYDRAMINE HCL 50 MG/ML IJ SOLN
25.0000 mg | Freq: Once | INTRAMUSCULAR | Status: AC
  Administered 2020-04-30: 25 mg via INTRAVENOUS
  Filled 2020-04-30: qty 1

## 2020-04-30 MED ORDER — SODIUM CHLORIDE 0.9 % IV BOLUS
500.0000 mL | Freq: Once | INTRAVENOUS | Status: AC
  Administered 2020-04-30: 500 mL via INTRAVENOUS

## 2020-04-30 MED ORDER — PROCHLORPERAZINE EDISYLATE 10 MG/2ML IJ SOLN
5.0000 mg | Freq: Once | INTRAMUSCULAR | Status: AC
  Administered 2020-04-30: 5 mg via INTRAVENOUS
  Filled 2020-04-30: qty 2

## 2020-04-30 MED ORDER — ONDANSETRON HCL 4 MG/2ML IJ SOLN
4.0000 mg | Freq: Once | INTRAMUSCULAR | Status: AC
  Administered 2020-04-30: 4 mg via INTRAVENOUS
  Filled 2020-04-30: qty 2

## 2020-04-30 MED ORDER — ONDANSETRON 4 MG PO TBDP: 4.0000 mg | ORAL_TABLET | Freq: Three times a day (TID) | ORAL | 0 refills | Status: DC | PRN

## 2020-04-30 NOTE — Care Management (Signed)
   Patient: _________________[name]_____________    Date of Birth: _Apr 25, 1933__/___/___     Last 4 of SSN:  ______  Address: [place]: ________________________[place]___________________ State: ____[place] Zip: [address]  Telephone: Home [phone] Cell: (____) ____________  Email:____________________  Primary Insurance Payer:  __[employer]   Secondary Insurance Payer:_____________________  Sex:  ? Female    ?X   Female                     Marital Status: ? Married     ? Single     ? Divorced     ? Widowed  Emergency Contact: ____Lynn Claudia Desanctis  Relationship:____Daughter   Telephone: [phone]    Approved for release of information:  ____Yes  ____No     Primary Care Physician Name: [facility] Name:  ________________________________________ Telephone: (____)  _____________ Other Providers: _______________________________________________________________________ _____________________________________________________________________________________ Current Medical Conditions: xVisit summary attached with PMH faxed to Remote   Current Medications, Herbal Supplements & Vitamins (Daily Dose, Start Date, Name of Prescriber):  xVisit summary attached with complete med list- faxed to remote  __________________________________________________________________________________________________________________________________________________________________________________________________________________________________________  Reason foo Remote Health :COVID monitoring                                     Referring Provider : ___Dr Vevelyn Pat Date :__01/08/2022_____   Telephone : [phone] CM_)______________   Fax referral to:  [phone] Call with questions or additional needs: [phone]

## 2020-04-30 NOTE — ED Notes (Signed)
Patient given coffee for PO challenge per MD

## 2020-04-30 NOTE — ED Notes (Signed)
Pt sts she tolerated po fluids and is ready to go home; EDP notified.

## 2020-04-30 NOTE — Care Management (Signed)
Care order/instruction: Consult to remote health for monitoring (Order [id number]) Nursing Date: 04/30/2020 Department: [facility] [place] EMERGENCY DEPARTMENT Ordering/Authorizing: Gareth Morgan, MD    Gareth Morgan, MD NPI: [id number]      Patient Information  Patient Name  Brandy Wheeler, Brandy Wheeler Legal Sex  Female DOB  1931/05/06 SSN  [id number]   Order Information  Order Date/Time Release Date/Time Start Date/Time End Date/Time  04/30/20 12:38 PM None 04/30/20 12:38 PM 04/30/20 12:38 PM   Order History Inpatient Date/Time Action Taken User Additional Information  04/30/20 1238 Sign Verdell Carmine, RN Ordering Mode: Verbal with Read Back: Cosign Required  04/30/20 1238 Release Instance Verdell Carmine, RN (auto-released) Released Order: [id number]   Order Questions

## 2020-04-30 NOTE — TOC Progression Note (Addendum)
Transition of Care [facility]) - Progression Note    Patient Details  Name: Brandy Wheeler MRN: [id number] Date of Birth: 1931-09-03  Transition of Care [facility]) CM/SW Coqui, RN Phone Number: 04/30/2020, 12:41 PM  Clinical Narrative:     Patient lives with daughter, call from Dr Billy Fischer to see if she could have remote services for [place] monitoring. Called remote and consult written and faxed referral form       Expected Discharge Plan and Services                                                 Social Determinants of Health (SDOH) Interventions    Readmission Risk Interventions No flowsheet data found.

## 2020-04-30 NOTE — ED Provider Notes (Signed)
  Physical Exam  BP (!) 125/110 (BP Location: Right Arm)   Pulse (!) 114   Temp 98.4 F (36.9 C) (Oral)   Resp 18   Ht 5\' 3"  (1.6 m)   Wt 73.5 kg   SpO2 94%   BMI 28.70 kg/m   Physical Exam  ED Course/Procedures     Procedures  MDM  Received care of pt from Dr. Pilar Plate. Please see his note for prior hx, physical and care. Briefly, this is an 87yo female who presented with concern for nausea in setting of recent diagnosis of COVID 19 and UTI.   Labs show hyponatremia, likely acute in setting of hypovolemia and dehydration. Plan for rehydration, po challenge.    After IV fluids and zofran sodium has improved.  She is tolerating po without emesis.  She is continuing to maintain her O2 saturations.  Because she is not clearly within one week of symptoms she is not candidate at this time for MAB infusion and unclear benefit with omicron variant.    Placed order for COVID home health monitoring given age as risk factor.  She has not been vaccinated. Recommend continued supportive care, given rx for zofran. Patient discharged in stable condition with understanding of reasons to return.         Alvira Monday, MD 05/01/20 2220

## 2020-04-30 NOTE — ED Notes (Signed)
Patient called and asked if she can walk outside the room.  Explained to patient that since she has covid, she is not allow to walk outside the room. She can walk in the room.  Patient verbalized understanding.

## 2020-04-30 NOTE — ED Triage Notes (Addendum)
Pt is /co nausea   Pt states she was seen here earlier in the week and was diagnosed with cystitis and has been on medication for that  Pt states she has been on medication for that  Pt states she has had dry heaves without vomiting  Pt had a positive covid test on 04/27/20

## 2020-04-30 NOTE — ED Provider Notes (Signed)
[facility] Emergency Department Provider Note MRN:  [id number]  Arrival date & time: 04/30/20     Chief Complaint   Nausea   History of Present Illness   Brandy Wheeler is a 87 y.o. year-old female with a history of GERD, diverticulitis presenting to the ED with chief complaint of nausea.  Nausea and dry heaves today.  Recent diagnosis of UTI.  Recently tested positive for COVID-19.  Has been taking her antibiotics.  Denies any headache or vision change, no chest pain or shortness of breath, no abdominal pain.  Only able to have sips of water today, feels dehydrated.  Review of Systems  A complete 10 system review of systems was obtained and all systems are negative except as noted in the HPI and PMH.   Patient's Health History    Past Medical History:  Diagnosis Date  . Diverticulitis   . GERD (gastroesophageal reflux disease)     Past Surgical History:  Procedure Laterality Date  . ABDOMINAL HYSTERECTOMY    . APPENDECTOMY      History reviewed. No pertinent family history.  Social History   Socioeconomic History  . Marital status: Divorced    Spouse name: Not on file  . Number of children: Not on file  . Years of education: Not on file  . Highest education level: Not on file  Occupational History  . Not on file  Tobacco Use  . Smoking status: Never Smoker  . Smokeless tobacco: Never Used  Substance and Sexual Activity  . Alcohol use: Yes  . Drug use: No  . Sexual activity: Not on file  Other Topics Concern  . Not on file  Social History Narrative  . Not on file   Social Determinants of Health   Financial Resource Strain: Not on file  Food Insecurity: Not on file  Transportation Needs: Not on file  Physical Activity: Not on file  Stress: Not on file  Social Connections: Not on file  Intimate Partner Violence: Not on file     Physical Exam   Vitals:   04/30/20 0447 04/30/20 0600  BP: (!) 162/76 (!) 178/77  Pulse: 83 81   Resp: 20 (!) 22  Temp: 97.6 F (36.4 C)   SpO2: 99% 97%    CONSTITUTIONAL: Well-appearing, NAD NEURO:  Alert and oriented x 3, no focal deficits EYES:  eyes equal and reactive ENT/NECK:  no LAD, no JVD CARDIO: Regular rate, well-perfused, normal S1 and S2 PULM:  CTAB no wheezing or rhonchi GI/GU:  normal bowel sounds, non-distended, non-tender MSK/SPINE:  No gross deformities, no edema SKIN:  no rash, atraumatic PSYCH:  Appropriate speech and behavior  *Additional and/or pertinent findings included in MDM below  Diagnostic and Interventional Summary    EKG Interpretation  Date/Time:  Saturday April 30 2020 05:05:19 EST Ventricular Rate:  82 PR Interval:    QRS Duration: 84 QT Interval:  359 QTC Calculation: 420 R Axis:   76 Text Interpretation: Sinus rhythm Confirmed by Gerlene Fee [phone]) on 04/30/2020 5:08:21 AM      Labs Reviewed  COMPREHENSIVE METABOLIC PANEL - Abnormal; Notable for the following components:      Result Value   Sodium 126 (*)    Chloride 91 (*)    Glucose, Bld 124 (*)    Creatinine, Ser 1.06 (*)    Calcium 8.5 (*)    GFR, Estimated 51 (*)    All other components within normal limits  CBC  BASIC METABOLIC  PANEL    No orders to display    Medications  sodium chloride 0.9 % bolus 500 mL (has no administration in time range)  prochlorperazine (COMPAZINE) injection 5 mg (has no administration in time range)  diphenhydrAMINE (BENADRYL) injection 25 mg (has no administration in time range)  sodium chloride 0.9 % bolus 1,000 mL (1,000 mLs Intravenous New Bag/Given 04/30/20 0526)  ondansetron (ZOFRAN) injection 4 mg (4 mg Intravenous Given 04/30/20 0524)     Procedures  /  Critical Care Procedures  ED Course and Medical Decision Making  I have reviewed the triage vital signs, the nursing notes, and pertinent available records from the EMR.  Listed above are laboratory and imaging tests that I personally ordered, reviewed, and interpreted and  then considered in my medical decision making (see below for details).  Multiple recent ED visits, patient's abdominal pain is resolved, now with isolated nausea, dry heaves.  Suspect related to underlying COVID-19 infection reassuring vital signs, normal oxygen saturation, no increased work of breathing, benign abdomen.  Will attempt symptomatic control, provide IV fluids, screen with labs to ensure no metabolic disturbance or AKI.     Labs reveal hyponatremia, likely in the setting of hypovolemia.  Providing total of 1500 normal saline, further nausea medication.  Patient is overall feeling better, will recheck BMP at 8 AM, with improving sodium would be appropriate for discharge.  Signed out to oncoming provider at shift change.  Barth Kirks. Sedonia Small, MD [place] [email or web address]  Final Clinical Impressions(s) / ED Diagnoses     ICD-10-CM   1. COVID-19  U07.1   2. Nausea  R11.0   3. Dehydration  E86.0   4. Hyponatremia  E87.1     ED Discharge Orders    None       Discharge Instructions Discussed with and Provided to Patient:   Discharge Instructions   None       Maudie Flakes, MD 04/30/20 [phone]

## 2020-05-02 ENCOUNTER — Telehealth: Payer: Self-pay | Admitting: Nurse Practitioner

## 2020-05-02 NOTE — Telephone Encounter (Signed)
[place] [phone]) called pt for HFU. Verified DOB. Pt states feeling better with no complaints other than feeling somewhat nauseated after eating a tomato sandwich for lunch. Offered in person visit or telephone visit but pt declined stating she "thinks she is coming out of this thing." pt verbalized understanding to call clinic for worsening symptoms or concerns.

## 2020-08-10 DIAGNOSIS — L570 Actinic keratosis: Secondary | ICD-10-CM | POA: Diagnosis not present

## 2020-08-10 DIAGNOSIS — L820 Inflamed seborrheic keratosis: Secondary | ICD-10-CM | POA: Diagnosis not present

## 2020-08-10 DIAGNOSIS — D1801 Hemangioma of skin and subcutaneous tissue: Secondary | ICD-10-CM | POA: Diagnosis not present

## 2020-08-10 DIAGNOSIS — L578 Other skin changes due to chronic exposure to nonionizing radiation: Secondary | ICD-10-CM | POA: Diagnosis not present

## 2020-08-10 DIAGNOSIS — L821 Other seborrheic keratosis: Secondary | ICD-10-CM | POA: Diagnosis not present

## 2020-09-28 DIAGNOSIS — Z0100 Encounter for examination of eyes and vision without abnormal findings: Secondary | ICD-10-CM | POA: Diagnosis not present

## 2020-09-28 DIAGNOSIS — H524 Presbyopia: Secondary | ICD-10-CM | POA: Diagnosis not present

## 2020-12-19 DIAGNOSIS — Z961 Presence of intraocular lens: Secondary | ICD-10-CM | POA: Diagnosis not present

## 2020-12-19 DIAGNOSIS — H43391 Other vitreous opacities, right eye: Secondary | ICD-10-CM | POA: Diagnosis not present

## 2020-12-19 DIAGNOSIS — H43311 Vitreous membranes and strands, right eye: Secondary | ICD-10-CM | POA: Diagnosis not present

## 2021-01-16 DIAGNOSIS — H43311 Vitreous membranes and strands, right eye: Secondary | ICD-10-CM | POA: Diagnosis not present

## 2021-01-16 DIAGNOSIS — Z961 Presence of intraocular lens: Secondary | ICD-10-CM | POA: Diagnosis not present

## 2021-01-16 DIAGNOSIS — H43391 Other vitreous opacities, right eye: Secondary | ICD-10-CM | POA: Diagnosis not present

## 2021-04-06 DIAGNOSIS — M7551 Bursitis of right shoulder: Secondary | ICD-10-CM | POA: Diagnosis not present

## 2021-04-06 DIAGNOSIS — M67813 Other specified disorders of tendon, right shoulder: Secondary | ICD-10-CM | POA: Diagnosis not present

## 2021-04-06 DIAGNOSIS — M19011 Primary osteoarthritis, right shoulder: Secondary | ICD-10-CM | POA: Diagnosis not present

## 2021-08-14 DIAGNOSIS — L821 Other seborrheic keratosis: Secondary | ICD-10-CM | POA: Diagnosis not present

## 2021-08-14 DIAGNOSIS — L918 Other hypertrophic disorders of the skin: Secondary | ICD-10-CM | POA: Diagnosis not present

## 2021-08-14 DIAGNOSIS — L570 Actinic keratosis: Secondary | ICD-10-CM | POA: Diagnosis not present

## 2021-08-14 DIAGNOSIS — L820 Inflamed seborrheic keratosis: Secondary | ICD-10-CM | POA: Diagnosis not present

## 2021-08-14 DIAGNOSIS — Z808 Family history of malignant neoplasm of other organs or systems: Secondary | ICD-10-CM | POA: Diagnosis not present

## 2021-08-14 DIAGNOSIS — L578 Other skin changes due to chronic exposure to nonionizing radiation: Secondary | ICD-10-CM | POA: Diagnosis not present

## 2021-10-03 ENCOUNTER — Emergency Department (HOSPITAL_BASED_OUTPATIENT_CLINIC_OR_DEPARTMENT_OTHER)
Admission: EM | Admit: 2021-10-03 | Discharge: 2021-10-03 | Disposition: A | Discharge status: Home / Self Care | Payer: Medicare HMO | Attending: Emergency Medicine | Admitting: Emergency Medicine

## 2021-10-03 ENCOUNTER — Other Ambulatory Visit (HOSPITAL_BASED_OUTPATIENT_CLINIC_OR_DEPARTMENT_OTHER): Payer: Self-pay

## 2021-10-03 ENCOUNTER — Emergency Department (HOSPITAL_BASED_OUTPATIENT_CLINIC_OR_DEPARTMENT_OTHER): Payer: Medicare HMO

## 2021-10-03 ENCOUNTER — Other Ambulatory Visit: Payer: Self-pay

## 2021-10-03 ENCOUNTER — Encounter (HOSPITAL_BASED_OUTPATIENT_CLINIC_OR_DEPARTMENT_OTHER): Payer: Self-pay | Admitting: Emergency Medicine

## 2021-10-03 DIAGNOSIS — K8020 Calculus of gallbladder without cholecystitis without obstruction: Secondary | ICD-10-CM | POA: Diagnosis not present

## 2021-10-03 DIAGNOSIS — R319 Hematuria, unspecified: Secondary | ICD-10-CM | POA: Insufficient documentation

## 2021-10-03 DIAGNOSIS — D414 Neoplasm of uncertain behavior of bladder: Secondary | ICD-10-CM | POA: Diagnosis not present

## 2021-10-03 DIAGNOSIS — K449 Diaphragmatic hernia without obstruction or gangrene: Secondary | ICD-10-CM | POA: Diagnosis not present

## 2021-10-03 DIAGNOSIS — N1330 Unspecified hydronephrosis: Secondary | ICD-10-CM | POA: Diagnosis not present

## 2021-10-03 DIAGNOSIS — R350 Frequency of micturition: Secondary | ICD-10-CM

## 2021-10-03 DIAGNOSIS — R1032 Left lower quadrant pain: Secondary | ICD-10-CM | POA: Diagnosis not present

## 2021-10-03 DIAGNOSIS — K5730 Diverticulosis of large intestine without perforation or abscess without bleeding: Secondary | ICD-10-CM | POA: Diagnosis not present

## 2021-10-03 DIAGNOSIS — D494 Neoplasm of unspecified behavior of bladder: Secondary | ICD-10-CM

## 2021-10-03 DIAGNOSIS — N390 Urinary tract infection, site not specified: Secondary | ICD-10-CM | POA: Diagnosis not present

## 2021-10-03 DIAGNOSIS — N130 Hydronephrosis with ureteropelvic junction obstruction: Secondary | ICD-10-CM | POA: Diagnosis not present

## 2021-10-03 LAB — CBC WITH DIFFERENTIAL/PLATELET
Abs Immature Granulocytes: 0.04 10*3/uL (ref 0.00–0.07)
Basophils Absolute: 0.1 10*3/uL (ref 0.0–0.1)
Basophils Relative: 1 %
Eosinophils Absolute: 0 10*3/uL (ref 0.0–0.5)
Eosinophils Relative: 0 %
HCT: 41.1 % (ref 36.0–46.0)
Hemoglobin: 13.6 g/dL (ref 12.0–15.0)
Immature Granulocytes: 0 %
Lymphocytes Relative: 17 %
Lymphs Abs: 1.6 10*3/uL (ref 0.7–4.0)
MCH: 29.7 pg (ref 26.0–34.0)
MCHC: 33.1 g/dL (ref 30.0–36.0)
MCV: 89.7 fL (ref 80.0–100.0)
Monocytes Absolute: 0.6 10*3/uL (ref 0.1–1.0)
Monocytes Relative: 7 %
Neutro Abs: 7 10*3/uL (ref 1.7–7.7)
Neutrophils Relative %: 75 %
Platelets: 319 10*3/uL (ref 150–400)
RBC: 4.58 MIL/uL (ref 3.87–5.11)
RDW: 13.1 % (ref 11.5–15.5)
WBC: 9.3 10*3/uL (ref 4.0–10.5)
nRBC: 0 % (ref 0.0–0.2)

## 2021-10-03 LAB — URINALYSIS, ROUTINE W REFLEX MICROSCOPIC
Glucose, UA: NEGATIVE mg/dL
Ketones, ur: NEGATIVE mg/dL
Nitrite: NEGATIVE
Protein, ur: >=300 mg/dL — AB
Specific Gravity, Urine: 1.025 (ref 1.005–1.030)
pH: 7 (ref 5.0–8.0)

## 2021-10-03 LAB — URINALYSIS, MICROSCOPIC (REFLEX): RBC / HPF: >50 RBC/hpf (ref 0–5)

## 2021-10-03 LAB — COMPREHENSIVE METABOLIC PANEL
ALT: 10 U/L (ref 0–44)
AST: 15 U/L (ref 15–41)
Albumin: 3.1 g/dL — ABNORMAL LOW (ref 3.5–5.0)
Alkaline Phosphatase: 56 U/L (ref 38–126)
Anion gap: 7 (ref 5–15)
BUN: 26 mg/dL — ABNORMAL HIGH (ref 8–23)
CO2: 27 mmol/L (ref 22–32)
Calcium: 8.5 mg/dL — ABNORMAL LOW (ref 8.9–10.3)
Chloride: 102 mmol/L (ref 98–111)
Creatinine, Ser: 1.33 mg/dL — ABNORMAL HIGH (ref 0.44–1.00)
GFR, Estimated: 38 mL/min — ABNORMAL LOW (ref >60)
Glucose, Bld: 124 mg/dL — ABNORMAL HIGH (ref 70–99)
Potassium: 3.8 mmol/L (ref 3.5–5.1)
Sodium: 136 mmol/L (ref 135–145)
Total Bilirubin: 0.6 mg/dL (ref 0.3–1.2)
Total Protein: 6.3 g/dL — ABNORMAL LOW (ref 6.5–8.1)

## 2021-10-03 MED ORDER — CEPHALEXIN 500 MG PO CAPS
500.0000 mg | ORAL_CAPSULE | Freq: Three times a day (TID) | ORAL | 0 refills | Status: AC
Start: 2021-10-03 — End: 2021-10-13
  Filled 2021-10-03: qty 30, 10d supply, fill #0

## 2021-10-03 MED ORDER — CEPHALEXIN 500 MG PO CAPS
500.0000 mg | ORAL_CAPSULE | Freq: Three times a day (TID) | ORAL | 0 refills | Status: DC
Start: 2021-10-03 — End: 2021-10-03

## 2021-10-03 MED ORDER — MIRABEGRON ER 50 MG PO TB24
50.0000 mg | ORAL_TABLET | Freq: Every day | ORAL | 0 refills | Status: DC
Start: 2021-10-03 — End: 2021-10-03

## 2021-10-03 MED ORDER — MIRABEGRON ER 50 MG PO TB24
50.0000 mg | ORAL_TABLET | Freq: Every day | ORAL | 0 refills | Status: DC
  Filled 2021-10-03: qty 30, 30d supply, fill #0

## 2021-10-03 NOTE — ED Provider Notes (Signed)
  Physical Exam  BP (!) 158/72   Pulse (!) 105   Temp 98.5 F (36.9 C) (Oral)   Resp 20   Ht '5\' 3"'$  (1.6 m)   Wt 73 kg   SpO2 100%   BMI 28.52 kg/m   Physical Exam  Procedures  Procedures  ED Course / MDM    Medical Decision Making Amount and/or Complexity of Data Reviewed Labs: ordered. Decision-making details documented in ED Course. Radiology: ordered and independent interpretation performed. Decision-making details documented in ED Course.  Risk Prescription drug management.   Received care of patient from Dr. Ralene Bathe. Please see her note for prior care.  No significant acue lab abnormalities.  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  I spoke with Urology who will work on outpatient follow up.    Given rx for abx given many bacteria on urinalysis. No fever, no signs of sepsis.  Patient discharged in stable condition with understanding of reasons to return.         Gareth Morgan, MD 10/05/21 0005

## 2021-10-03 NOTE — ED Provider Notes (Signed)
[facility] EMERGENCY DEPARTMENT Provider Note   CSN: [id number] Arrival date & time: 10/03/21  4098     History  Chief Complaint  Patient presents with   Hematuria    Lise Pincus is a 88 y.o. female.  The history is provided by the patient and medical records.  Hematuria  Leanny Moeckel is a 88 y.o. female who presents to the Emergency Department complaining of hematuria.  She presents to the emergency department accompanied by her daughter for evaluation of her hematuria that started around 830 last night.  She describes her urine is grossly bloody.  She has associated urinary frequency.  Around midnight she developed some left lower quadrant abdominal pain as well.  She describes her pain as a dull ache.  She did take 2 Advil and her pain is partially improved.  She does have some urethral pain with urination.  No fever, nausea, vomiting, clots in her urine.  She does feel like she can empty her bladder.  No prior similar symptoms.  She does have history of acid reflux.  She does not take any blood thinners.      Home Medications Prior to Admission medications   Medication Sig Start Date End Date Taking? Authorizing Provider  omeprazole (PRILOSEC) 10 MG capsule Take 10 mg by mouth daily.    [provider]  ondansetron (ZOFRAN ODT) 4 MG disintegrating tablet Take 1 tablet (4 mg total) by mouth every 8 (eight) hours as needed for nausea or vomiting. 04/30/20   Gareth Morgan, MD  oxyCODONE-acetaminophen (PERCOCET/ROXICET) 5-325 MG tablet Take 1 tablet by mouth every 6 (six) hours as needed for severe pain. 04/27/20   Long, Wonda Olds, MD  senna-docusate (SENOKOT-S) 8.6-50 MG tablet Take 1 tablet by mouth at bedtime as needed for mild constipation or moderate constipation. 04/27/20   Long, Wonda Olds, MD      Allergies    Patient has no known allergies.    Review of Systems   Review of Systems  Genitourinary:  Positive for hematuria.  All other systems reviewed and  are negative.   Physical Exam Updated Vital Signs BP (!) 179/83 (BP Location: Right Arm)   Pulse (!) 110   Temp 98.4 F (36.9 C) (Oral)   Resp 20   Ht '5\' 3"'$  (1.6 m)   Wt 73 kg   SpO2 94%   BMI 28.52 kg/m  Physical Exam Vitals and nursing note reviewed.  Constitutional:      Appearance: She is well-developed.  HENT:     Head: Normocephalic and atraumatic.  Cardiovascular:     Rate and Rhythm: Regular rhythm. Tachycardia present.     Heart sounds: No murmur heard. Pulmonary:     Effort: Pulmonary effort is normal. No respiratory distress.     Breath sounds: Normal breath sounds.  Abdominal:     Palpations: Abdomen is soft.     Tenderness: There is no guarding or rebound.     Comments: Moderate suprapubic and mild left lower quadrant tenderness  Musculoskeletal:        General: No tenderness.  Skin:    General: Skin is warm and dry.  Neurological:     Mental Status: She is alert and oriented to person, place, and time.  Psychiatric:        Behavior: Behavior normal.     ED Results / Procedures / Treatments   Labs (all labs ordered are listed, but only abnormal results are displayed) Labs Reviewed - No data  to display  EKG None  Radiology No results found.  Procedures Procedures    Medications Ordered in ED Medications - No data to display  ED Course/ Medical Decision Making/ A&P                           Medical Decision Making Amount and/or Complexity of Data Reviewed Labs: ordered. Radiology: ordered.   Patient here for evaluation of hematuria, left lower quadrant pain.  She is mildly tachycardic on evaluation but nontoxic-appearing.  She does have lower abdominal tenderness.  Plan to check urinalysis, renal function, CT stone study.  Patient care transferred pending further work-up.  Patient declines pain medications at this time.       Final Clinical Impression(s) / ED Diagnoses Final diagnoses:  None    Rx / DC Orders ED Discharge  Orders     None         Quintella Reichert, MD 10/03/21 [phone]

## 2021-10-03 NOTE — ED Triage Notes (Signed)
Started having bleeding and urgency last night. Had some pain in left lower abd. Took some Advil at home and pain resolved.

## 2021-10-04 ENCOUNTER — Other Ambulatory Visit: Payer: Self-pay | Admitting: Urology

## 2021-10-04 LAB — URINE CULTURE

## 2021-10-05 NOTE — Patient Instructions (Signed)
DUE TO COVID-19 ONLY TWO VISITORS  (aged 86 and older)  ARE ALLOWED TO COME WITH YOU AND STAY IN THE WAITING ROOM ONLY DURING PRE OP AND PROCEDURE.   **NO VISITORS ARE ALLOWED IN THE SHORT STAY AREA OR RECOVERY ROOM!!**  IF YOU WILL BE ADMITTED INTO THE HOSPITAL YOU ARE ALLOWED ONLY FOUR SUPPORT PEOPLE DURING VISITATION HOURS ONLY (7 AM -8PM)   The support person(s) must pass our screening, gel in and out, and wear a mask at all times, including in the patient's room. Patients must also wear a mask when staff or their support person are in the room. Visitors GUEST BADGE MUST BE WORN VISIBLY  One adult visitor may remain with you overnight and MUST be in the room by 8 P.M.     Your procedure is scheduled on: 10/09/21   Report to [facility] Main Entrance    Report to admitting at   8:00 AM   Call this number if you have problems the morning of surgery [phone]   Do not eat food :or drink After Midnight.            If you have questions, please contact your surgeon's office.       Oral Hygiene is also important to reduce your risk of infection.                                    Remember - BRUSH YOUR TEETH THE MORNING OF SURGERY WITH YOUR REGULAR TOOTHPASTE   Do NOT smoke after Midnight   Take these medicines the morning of surgery with A SIP OF WATER: Omeprazole, Mirabegron                                You may not have any metal on your body including jewelry, and body piercing             Do not wear lotions, powders, perfumes/cologne, or deodorant                Men may shave face and neck.   Do not bring valuables to the hospital. [place].   Contacts, dentures or bridgework may not be worn into surgery.       Patients discharged on the day of surgery will not be allowed to drive home.  Someone NEEDS to stay with you for the first 24 hours after anesthesia.   Special Instructions: Bring a copy of  your healthcare power of attorney and living will documents   the day of surgery if you haven't scanned them before.              Please read over the following fact sheets you were given: IF YOU HAVE QUESTIONS ABOUT YOUR PRE-OP INSTRUCTIONS PLEASE CALL [phone]     [facility] - Preparing for Surgery Before surgery, you can play an important role.  Because skin is not sterile, your skin needs to be as free of germs as possible.  You can reduce the number of germs on your skin by washing with CHG (chlorahexidine gluconate) soap before surgery.  CHG is an antiseptic cleaner which kills germs and bonds with the skin to continue killing germs even after washing. Please DO NOT  use if you have an allergy to CHG or antibacterial soaps.  If your skin becomes reddened/irritated stop using the CHG and inform your nurse when you arrive at Short Stay..  You may shave your face/neck. Please follow these instructions carefully:  1.  Shower with CHG Soap the night before surgery and the  morning of Surgery.  2.  If you choose to wash your hair, wash your hair first as usual with your  normal  shampoo.  3.  After you shampoo, rinse your hair and body thoroughly to remove the  shampoo.                 4.  Use CHG as you would any other liquid soap.  You can apply chg directly  to the skin and wash                       Gently with a scrungie or clean washcloth.  5.  Apply the CHG Soap to your body ONLY FROM THE NECK DOWN.   Do not use on face/ open                           Wound or open sores. Avoid contact with eyes, ears mouth and genitals (private parts).                       Wash face,  Genitals (private parts) with your normal soap.             6.  Wash thoroughly, paying special attention to the area where your surgery  will be performed.  7.  Thoroughly rinse your body with warm water from the neck down.  8.  DO NOT shower/wash with your normal soap after using and rinsing off  the CHG Soap.                 9.  Pat yourself dry with a clean towel.            10.  Wear clean pajamas.            11.  Place clean sheets on your bed the night of your first shower and do not  sleep with pets. Day of Surgery : Do not apply any lotions/deodorants the morning of surgery.  Please wear clean clothes to the hospital/surgery center.  FAILURE TO FOLLOW THESE INSTRUCTIONS MAY RESULT IN THE CANCELLATION OF YOUR SURGERY    ________________________________________________________________________

## 2021-10-06 ENCOUNTER — Encounter (HOSPITAL_COMMUNITY): Payer: Self-pay

## 2021-10-06 ENCOUNTER — Other Ambulatory Visit: Payer: Self-pay

## 2021-10-06 ENCOUNTER — Encounter (HOSPITAL_COMMUNITY)
Admission: RE | Admit: 2021-10-06 | Discharge: 2021-10-06 | Disposition: A | Discharge status: Home / Self Care | Payer: Medicare HMO | Source: Ambulatory Visit | Attending: Urology | Admitting: Urology

## 2021-10-06 DIAGNOSIS — Z01812 Encounter for preprocedural laboratory examination: Secondary | ICD-10-CM | POA: Diagnosis not present

## 2021-10-06 DIAGNOSIS — Z01818 Encounter for other preprocedural examination: Secondary | ICD-10-CM

## 2021-10-06 HISTORY — DX: Elevated blood-pressure reading, without diagnosis of hypertension: R03.0

## 2021-10-06 HISTORY — DX: Personal history of other diseases of the digestive system: Z87.19

## 2021-10-06 LAB — BASIC METABOLIC PANEL
Anion gap: 6 (ref 5–15)
BUN: 21 mg/dL (ref 8–23)
CO2: 28 mmol/L (ref 22–32)
Calcium: 9.2 mg/dL (ref 8.9–10.3)
Chloride: 102 mmol/L (ref 98–111)
Creatinine, Ser: 1.2 mg/dL — ABNORMAL HIGH (ref 0.44–1.00)
GFR, Estimated: 44 mL/min — ABNORMAL LOW (ref >60)
Glucose, Bld: 118 mg/dL — ABNORMAL HIGH (ref 70–99)
Potassium: 4.9 mmol/L (ref 3.5–5.1)
Sodium: 136 mmol/L (ref 135–145)

## 2021-10-06 LAB — CBC
HCT: 43.4 % (ref 36.0–46.0)
Hemoglobin: 14.1 g/dL (ref 12.0–15.0)
MCH: 30.3 pg (ref 26.0–34.0)
MCHC: 32.5 g/dL (ref 30.0–36.0)
MCV: 93.3 fL (ref 80.0–100.0)
Platelets: 381 10*3/uL (ref 150–400)
RBC: 4.65 MIL/uL (ref 3.87–5.11)
RDW: 13.2 % (ref 11.5–15.5)
WBC: 10.9 10*3/uL — ABNORMAL HIGH (ref 4.0–10.5)
nRBC: 0 % (ref 0.0–0.2)

## 2021-10-06 NOTE — Progress Notes (Signed)
Anesthesia note:  Bowel prep reminder:NA  PCP - none she uses urgent care in [place] Cardiologist -none Other-   Chest x-ray - no EKG - no Stress Test - no ECHO - no Cardiac Cath - NA  Pacemaker/ICD device last checked:NA  Sleep Study - no CPAP -   Pt is pre diabetic-NA Fasting Blood Sugar -  Checks Blood Sugar _____  Blood Thinner:NA Blood Thinner Instructions: Aspirin Instructions: Last Dose:  Anesthesia review: no  Patient denies shortness of breath, fever, cough and chest pain at PAT appointment Pt has no SOB with any activities She has white coat syndrome and anxiety related to the surgery.  Patient verbalized understanding of instructions that were given to them at the PAT appointment. Patient was also instructed that they will need to review over the PAT instructions again at home before surgery. yes

## 2021-10-09 ENCOUNTER — Ambulatory Visit (HOSPITAL_BASED_OUTPATIENT_CLINIC_OR_DEPARTMENT_OTHER): Payer: Medicare HMO | Admitting: Certified Registered Nurse Anesthetist

## 2021-10-09 ENCOUNTER — Observation Stay (HOSPITAL_COMMUNITY)
Admission: RE | Admit: 2021-10-09 | Discharge: 2021-10-10 | Disposition: A | Discharge status: Home / Self Care | Payer: Medicare HMO | Attending: Urology | Admitting: Urology

## 2021-10-09 ENCOUNTER — Other Ambulatory Visit: Payer: Self-pay

## 2021-10-09 ENCOUNTER — Encounter (HOSPITAL_COMMUNITY)
Admission: RE | Disposition: A | Discharge status: Home / Self Care | Payer: Self-pay | Source: Home / Self Care | Attending: Urology

## 2021-10-09 ENCOUNTER — Ambulatory Visit (HOSPITAL_COMMUNITY): Payer: Medicare HMO | Admitting: Physician Assistant

## 2021-10-09 ENCOUNTER — Encounter (HOSPITAL_COMMUNITY): Payer: Self-pay | Admitting: Urology

## 2021-10-09 DIAGNOSIS — D494 Neoplasm of unspecified behavior of bladder: Secondary | ICD-10-CM | POA: Diagnosis present

## 2021-10-09 DIAGNOSIS — N130 Hydronephrosis with ureteropelvic junction obstruction: Secondary | ICD-10-CM | POA: Insufficient documentation

## 2021-10-09 DIAGNOSIS — C679 Malignant neoplasm of bladder, unspecified: Principal | ICD-10-CM | POA: Insufficient documentation

## 2021-10-09 DIAGNOSIS — Z01818 Encounter for other preprocedural examination: Secondary | ICD-10-CM

## 2021-10-09 DIAGNOSIS — C678 Malignant neoplasm of overlapping sites of bladder: Secondary | ICD-10-CM | POA: Diagnosis not present

## 2021-10-09 HISTORY — PX: TRANSURETHRAL RESECTION OF BLADDER TUMOR: SHX2575

## 2021-10-09 HISTORY — PX: CYSTOSCOPY W/ URETERAL STENT PLACEMENT: SHX1429

## 2021-10-09 LAB — BASIC METABOLIC PANEL
Anion gap: 10 (ref 5–15)
BUN: 13 mg/dL (ref 8–23)
CO2: 19 mmol/L — ABNORMAL LOW (ref 22–32)
Calcium: 7.1 mg/dL — ABNORMAL LOW (ref 8.9–10.3)
Chloride: 105 mmol/L (ref 98–111)
Creatinine, Ser: 0.61 mg/dL (ref 0.44–1.00)
GFR, Estimated: >60 mL/min (ref >60)
Glucose, Bld: 92 mg/dL (ref 70–99)
Potassium: 4.4 mmol/L (ref 3.5–5.1)
Sodium: 134 mmol/L — ABNORMAL LOW (ref 135–145)

## 2021-10-09 SURGERY — TURBT (TRANSURETHRAL RESECTION OF BLADDER TUMOR)
Anesthesia: General | Laterality: Right

## 2021-10-09 MED ORDER — ONDANSETRON HCL 4 MG/2ML IJ SOLN: 4.0000 mg | INTRAMUSCULAR | Status: DC | PRN

## 2021-10-09 MED ORDER — TRIPLE ANTIBIOTIC 3.5-400-5000 EX OINT: 1.0000 | TOPICAL_OINTMENT | Freq: Three times a day (TID) | CUTANEOUS | Status: DC | PRN

## 2021-10-09 MED ORDER — SENNOSIDES-DOCUSATE SODIUM 8.6-50 MG PO TABS
2.0000 | ORAL_TABLET | Freq: Every day | ORAL | Status: DC
  Administered 2021-10-09: 2 via ORAL
  Filled 2021-10-09: qty 2

## 2021-10-09 MED ORDER — ONDANSETRON HCL 4 MG/2ML IJ SOLN
INTRAMUSCULAR | Status: DC | PRN
  Administered 2021-10-09: 4 mg via INTRAVENOUS

## 2021-10-09 MED ORDER — FENTANYL CITRATE PF 50 MCG/ML IJ SOSY
25.0000 ug | PREFILLED_SYRINGE | INTRAMUSCULAR | Status: DC | PRN
  Administered 2021-10-09: 50 ug via INTRAVENOUS

## 2021-10-09 MED ORDER — ROCURONIUM BROMIDE 100 MG/10ML IV SOLN
INTRAVENOUS | Status: DC | PRN
  Administered 2021-10-09: 20 mg via INTRAVENOUS
  Administered 2021-10-09: 30 mg via INTRAVENOUS
  Administered 2021-10-09: 50 mg via INTRAVENOUS

## 2021-10-09 MED ORDER — PROPOFOL 10 MG/ML IV BOLUS
INTRAVENOUS | Status: DC | PRN
  Administered 2021-10-09: 100 mg via INTRAVENOUS
  Administered 2021-10-09: 50 mg via INTRAVENOUS
  Administered 2021-10-09: 30 mg via INTRAVENOUS

## 2021-10-09 MED ORDER — SODIUM CHLORIDE 0.9 % IR SOLN
3000.0000 mL | Status: DC
  Administered 2021-10-09 (×2): 3000 mL

## 2021-10-09 MED ORDER — ACETAMINOPHEN 10 MG/ML IV SOLN: 1000.0000 mg | Freq: Once | INTRAVENOUS | Status: DC | PRN

## 2021-10-09 MED ORDER — DEXAMETHASONE SODIUM PHOSPHATE 4 MG/ML IJ SOLN
INTRAMUSCULAR | Status: DC | PRN
  Administered 2021-10-09: 4 mg via INTRAVENOUS

## 2021-10-09 MED ORDER — PROPOFOL 10 MG/ML IV BOLUS
INTRAVENOUS | Status: AC
  Filled 2021-10-09: qty 20

## 2021-10-09 MED ORDER — OXYCODONE HCL 5 MG PO TABS: 5.0000 mg | ORAL_TABLET | ORAL | Status: DC | PRN

## 2021-10-09 MED ORDER — LABETALOL HCL 5 MG/ML IV SOLN
INTRAVENOUS | Status: AC
  Filled 2021-10-09: qty 4

## 2021-10-09 MED ORDER — DIPHENHYDRAMINE HCL 50 MG/ML IJ SOLN
12.5000 mg | Freq: Four times a day (QID) | INTRAMUSCULAR | Status: DC | PRN
  Administered 2021-10-09: 12.5 mg via INTRAVENOUS
  Filled 2021-10-09: qty 1

## 2021-10-09 MED ORDER — FENTANYL CITRATE PF 50 MCG/ML IJ SOSY
PREFILLED_SYRINGE | INTRAMUSCULAR | Status: AC
  Administered 2021-10-09: 50 ug via INTRAVENOUS
  Filled 2021-10-09: qty 2

## 2021-10-09 MED ORDER — PHENOL 1.4 % MT LIQD
1.0000 | OROMUCOSAL | Status: DC | PRN
  Administered 2021-10-09: 1 via OROMUCOSAL
  Filled 2021-10-09: qty 177

## 2021-10-09 MED ORDER — SUGAMMADEX SODIUM 200 MG/2ML IV SOLN
INTRAVENOUS | Status: DC | PRN
  Administered 2021-10-09: 200 mg via INTRAVENOUS

## 2021-10-09 MED ORDER — ACETAMINOPHEN 325 MG PO TABS: 650.0000 mg | ORAL_TABLET | ORAL | Status: DC | PRN

## 2021-10-09 MED ORDER — LABETALOL HCL 5 MG/ML IV SOLN
INTRAVENOUS | Status: DC | PRN
  Administered 2021-10-09 (×3): 2.5 mg via INTRAVENOUS

## 2021-10-09 MED ORDER — LACTATED RINGERS IV SOLN: INTRAVENOUS | Status: DC

## 2021-10-09 MED ORDER — LIDOCAINE HCL (PF) 2 % IJ SOLN
INTRAMUSCULAR | Status: AC
  Filled 2021-10-09: qty 5

## 2021-10-09 MED ORDER — CHLORHEXIDINE GLUCONATE 0.12 % MT SOLN
15.0000 mL | Freq: Once | OROMUCOSAL | Status: AC
  Administered 2021-10-09: 15 mL via OROMUCOSAL

## 2021-10-09 MED ORDER — ORAL CARE MOUTH RINSE: 15.0000 mL | Freq: Once | OROMUCOSAL | Status: AC

## 2021-10-09 MED ORDER — FENTANYL CITRATE (PF) 100 MCG/2ML IJ SOLN
INTRAMUSCULAR | Status: AC
  Filled 2021-10-09: qty 2

## 2021-10-09 MED ORDER — PHENYLEPHRINE 80 MCG/ML (10ML) SYRINGE FOR IV PUSH (FOR BLOOD PRESSURE SUPPORT)
PREFILLED_SYRINGE | INTRAVENOUS | Status: DC | PRN
  Administered 2021-10-09 (×3): 120 ug via INTRAVENOUS
  Administered 2021-10-09: 80 ug via INTRAVENOUS
  Administered 2021-10-09: 120 ug via INTRAVENOUS

## 2021-10-09 MED ORDER — ONDANSETRON HCL 4 MG/2ML IJ SOLN
INTRAMUSCULAR | Status: AC
  Filled 2021-10-09: qty 2

## 2021-10-09 MED ORDER — OXYBUTYNIN CHLORIDE 5 MG PO TABS
5.0000 mg | ORAL_TABLET | Freq: Three times a day (TID) | ORAL | Status: DC | PRN
  Administered 2021-10-09: 5 mg via ORAL
  Filled 2021-10-09: qty 1

## 2021-10-09 MED ORDER — SODIUM CHLORIDE 0.9 % IV SOLN: INTRAVENOUS | Status: DC

## 2021-10-09 MED ORDER — DEXAMETHASONE SODIUM PHOSPHATE 10 MG/ML IJ SOLN
INTRAMUSCULAR | Status: AC
  Filled 2021-10-09: qty 1

## 2021-10-09 MED ORDER — FENTANYL CITRATE (PF) 100 MCG/2ML IJ SOLN
INTRAMUSCULAR | Status: DC | PRN
  Administered 2021-10-09 (×3): 50 ug via INTRAVENOUS
  Administered 2021-10-09 (×2): 25 ug via INTRAVENOUS

## 2021-10-09 MED ORDER — LIDOCAINE 2% (20 MG/ML) 5 ML SYRINGE
INTRAMUSCULAR | Status: DC | PRN
  Administered 2021-10-09: 60 mg via INTRAVENOUS

## 2021-10-09 MED ORDER — ROCURONIUM BROMIDE 10 MG/ML (PF) SYRINGE
PREFILLED_SYRINGE | INTRAVENOUS | Status: AC
  Filled 2021-10-09: qty 10

## 2021-10-09 MED ORDER — MIRABEGRON ER 25 MG PO TB24
50.0000 mg | ORAL_TABLET | Freq: Every day | ORAL | Status: DC
  Administered 2021-10-10: 50 mg via ORAL
  Filled 2021-10-09: qty 2

## 2021-10-09 MED ORDER — CEFAZOLIN SODIUM-DEXTROSE 2-4 GM/100ML-% IV SOLN
INTRAVENOUS | Status: AC
  Filled 2021-10-09: qty 100

## 2021-10-09 MED ORDER — CEFAZOLIN SODIUM-DEXTROSE 2-4 GM/100ML-% IV SOLN
2.0000 g | INTRAVENOUS | Status: AC
  Administered 2021-10-09: 2 g via INTRAVENOUS

## 2021-10-09 MED ORDER — CEPHALEXIN 500 MG PO CAPS
500.0000 mg | ORAL_CAPSULE | Freq: Three times a day (TID) | ORAL | Status: DC
  Administered 2021-10-09 – 2021-10-10 (×3): 500 mg via ORAL
  Filled 2021-10-09 (×3): qty 1

## 2021-10-09 MED ORDER — MORPHINE SULFATE (PF) 2 MG/ML IV SOLN: 2.0000 mg | INTRAVENOUS | Status: DC | PRN

## 2021-10-09 MED ORDER — DIPHENHYDRAMINE HCL 12.5 MG/5ML PO ELIX: 12.5000 mg | ORAL_SOLUTION | Freq: Four times a day (QID) | ORAL | Status: DC | PRN

## 2021-10-09 MED ORDER — SODIUM CHLORIDE 0.9 % IR SOLN
Status: DC | PRN
  Administered 2021-10-09: 3000 mL via INTRAVESICAL
  Administered 2021-10-09: 1000 mL
  Administered 2021-10-09 (×2): 6000 mL via INTRAVESICAL

## 2021-10-09 MED ORDER — OXYCODONE HCL 5 MG PO TABS: 5.0000 mg | ORAL_TABLET | ORAL | 0 refills | Status: DC | PRN

## 2021-10-09 MED ORDER — PHENYLEPHRINE 80 MCG/ML (10ML) SYRINGE FOR IV PUSH (FOR BLOOD PRESSURE SUPPORT)
PREFILLED_SYRINGE | INTRAVENOUS | Status: AC
  Filled 2021-10-09: qty 10

## 2021-10-09 SURGICAL SUPPLY — 23 items
BAG URINE DRAIN 2000ML AR STRL (UROLOGICAL SUPPLIES) ×1 IMPLANT
BAG URO CATCHER STRL LF (MISCELLANEOUS) ×3 IMPLANT
CATH FOLEY 2WAY SLVR  5CC 18FR (CATHETERS)
CATH FOLEY 2WAY SLVR 5CC 18FR (CATHETERS) IMPLANT
CATH HEMA 3WAY 30CC 22FR COUDE (CATHETERS) ×1 IMPLANT
CATH URETL OPEN END 6FR 70 (CATHETERS) ×2 IMPLANT
CLOTH BEACON ORANGE TIMEOUT ST (SAFETY) ×3 IMPLANT
DRAPE FOOT SWITCH (DRAPES) ×3 IMPLANT
ELECT REM PT RETURN 15FT ADLT (MISCELLANEOUS) ×3 IMPLANT
GLOVE BIO SURGEON STRL SZ7.5 (GLOVE) ×3 IMPLANT
GOWN STRL REUS W/ TWL XL LVL3 (GOWN DISPOSABLE) ×2 IMPLANT
GOWN STRL REUS W/TWL XL LVL3 (GOWN DISPOSABLE) ×3
GUIDEWIRE STR DUAL SENSOR (WIRE) ×3 IMPLANT
KIT TURNOVER KIT A (KITS) ×1 IMPLANT
LOOP CUT BIPOLAR 24F LRG (ELECTROSURGICAL) ×1 IMPLANT
MANIFOLD NEPTUNE II (INSTRUMENTS) ×3 IMPLANT
PACK CYSTO (CUSTOM PROCEDURE TRAY) ×3 IMPLANT
PLUG CATH AND CAP STER (CATHETERS) ×1 IMPLANT
SET IRRIG Y TYPE TUR BLADDER L (SET/KITS/TRAYS/PACK) ×1 IMPLANT
SYR TOOMEY IRRIG 70ML (MISCELLANEOUS) ×3
SYRINGE TOOMEY IRRIG 70ML (MISCELLANEOUS) IMPLANT
TUBING CONNECTING 10 (TUBING) ×3 IMPLANT
TUBING UROLOGY SET (TUBING) ×3 IMPLANT

## 2021-10-09 NOTE — Transfer of Care (Signed)
Immediate Anesthesia Transfer of Care Note  Patient: Brandy Wheeler  Procedure(s) Performed: TRANSURETHRAL RESECTION OF BLADDER TUMOR (TURBT) POSSIBLE BILATERAL RETROGRADE POSSIBLE RIGHT URETERAL STENT (Bilateral) POSSIBLE BILATERAL  RETROGRADE POSSIBLE RIGHT URETERA L STENT (Right)  Patient Location: PACU  Anesthesia Type:General  Level of Consciousness: awake, alert , oriented and patient cooperative  Airway & Oxygen Therapy: Patient Spontanous Breathing and Patient connected to face mask  Post-op Assessment: Report given to RN and Post -op Vital signs reviewed and stable  Post vital signs: Reviewed and stable  Last Vitals:  Vitals Value Taken Time  BP 151/87 10/09/21 1237  Temp    Pulse 93 10/09/21 1239  Resp    SpO2 100 % 10/09/21 1239  Vitals shown include unvalidated device data.  Last Pain:  Vitals:   10/09/21 0904  TempSrc: Oral  PainSc:          Complications: No notable events documented.

## 2021-10-09 NOTE — H&P (Signed)
CC/HPI: CC: Gross hematuria  HPI:  10/03/2021  88 year old female went to the emergency department complaining of gross hematuria. This has been going on for couple of days. She was also having some left-sided abdominal pain and discomfort. She was started on cephalexin. 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 There was no definite lymphadenopathy. She continues to have intermittent gross hematuria but it seems to be clearing up a little bit.     ALLERGIES: No Known Drug Allergies    MEDICATIONS: Prilosec  Cephalexin  Turmeric  Vitamin D2     GU PSH: None   NON-GU PSH: Appendectomy Hysterectomy     GU PMH: None   NON-GU PMH: GERD    FAMILY HISTORY: 2 daughters - Other 2 sons - Other   SOCIAL HISTORY: Marital Status: Widowed Preferred Language: English; Race: White Current Smoking Status: Patient has never smoked.   Tobacco Use Assessment Completed: Used Tobacco in last 30 days? Drinks 2 caffeinated drinks per day.    REVIEW OF SYSTEMS:    GU Review Female:   Patient reports frequent urination, hard to postpone urination, burning /pain with urination, and get up at night to urinate. Patient denies leakage of urine, stream starts and stops, trouble starting your stream, have to strain to urinate, and being pregnant.  Gastrointestinal (Upper):   Patient denies nausea, vomiting, and indigestion/ heartburn.  Gastrointestinal (Lower):   Patient reports constipation. Patient denies diarrhea.  Constitutional:   Patient denies fever, night sweats, weight loss, and fatigue.  Skin:   Patient denies skin rash/ lesion and itching.  Eyes:   Patient denies blurred vision and double vision.  Ears/ Nose/ Throat:   Patient denies sore throat and sinus problems.   Hematologic/Lymphatic:   Patient denies swollen glands and easy bruising.  Cardiovascular:   Patient reports leg swelling. Patient denies chest pains.  Respiratory:   Patient denies cough and shortness of breath.  Endocrine:   Patient denies excessive thirst.  Musculoskeletal:   Patient denies back pain and joint pain.  Neurological:   Patient denies headaches and dizziness.  Psychologic:   Patient denies depression and anxiety.   Notes: hematuria  UTI    VITAL SIGNS:      10/03/2021 03:41 PM  Weight 161 lb / 73.03 kg  Height 63 in / 160.02 cm  BP 186/98 mmHg  Heart Rate 112 /min  Temperature 98.4 F / 36.8 C  BMI 28.5 kg/m   MULTI-SYSTEM PHYSICAL EXAMINATION:    Constitutional: Well-nourished. No physical deformities. Normally developed. Good grooming.  Respiratory: No labored breathing, no use of accessory muscles.   Cardiovascular: Normal temperature, normal extremity pulses, no swelling, no varicosities.  Skin: No paleness, no jaundice, no cyanosis. No lesion, no ulcer, no rash.  Neurologic / Psychiatric: Oriented to time, oriented to place, oriented to person. No depression, no anxiety, no agitation.  Gastrointestinal: No mass, no tenderness, no rigidity, non obese abdomen.  Eyes: Normal conjunctivae. Normal eyelids.  Musculoskeletal: Normal gait and station of head and neck.     Complexity of Data:  Source Of History:  Patient  Records Review:   Previous Doctor Records, Previous Patient Records  Urine Test Review:   Urinalysis   PROCEDURES:         Flexible Cystoscopy - 52000  Risks, benefits, and the potential complications of the  procedure were discussed with the patient including infection, bleeding, voiding discomfort, urinary retention, etc. All questions were answered. Consent was obtained. Sterile technique and intraurethral analgesia were used.  Meatus:  Normal size. Normal location. Normal condition.  Urethra:  Urethra in the proximal portion had papillary  mucosa consistent with urethral involvement of bladder tumor  Ureteral Orifices:  Unable to locate ureteral orifices.  Bladder:  Patient has a large amount of bladder tumor. The majority of the right lateral wall all the way up to the bladder neck involves bladder tumor that appears high-grade and muscle invasive. Also some tumor along the left side of the bladder.      The procedure was well-tolerated and without complications. Instructions were given to call the office if she developed any problems. The patient stated that she understood these instructions.         Urinalysis w/Scope Dipstick Dipstick Cont'd Micro  Color: Red Bilirubin: Neg mg/dL WBC/hpf: 20 - 40/hpf  Appearance: Turbid Ketones: Trace mg/dL RBC/hpf: >60/hpf  Specific Gravity: 1.025 Blood: 3+ ery/uL Bacteria: Many (>50/hpf)  pH: 6.5 Protein: 3+ mg/dL Cystals: NS (Not Seen)  Glucose: Neg mg/dL Urobilinogen: 1.0 mg/dL Casts: NS (Not Seen)    Nitrites: Neg Trichomonas: Not Present    Leukocyte Esterase: 2+ leu/uL Mucous: Not Present      Epithelial Cells: NS (Not Seen)      Yeast: NS (Not Seen)      Sperm: Not Present    ASSESSMENT:      ICD-10 Details  1 GU:   Bladder tumor/neoplasm - D41.4 Undiagnosed New Problem  2   Hydronephrosis - N13.0 Undiagnosed New Problem   PLAN:           Orders Labs Urine Culture          Document Letter(s):  Created for Patient: Clinical Summary         Notes:   Plan for transurethral resection of bladder tumor. Also plan for bilateral retrograde pyelogram, possible right ureteral stent placement. Risk benefits discussed including but not limited to bleeding, infection, injury to surrounding structures, bladder perforation requiring exploratory laparotomy, need for additional procedures.   CC: Dr. Billy Fischer        Next Appointment:      Next Appointment: 10/09/2021 10:15 AM    Appointment Type: Surgery     Location: [facility] [phone]    Provider:  Link Snuffer, III, M.D.    Reason for Visit: OP WL TURBT POSS BIL RTG POSS RT STENT     Signed by Link Snuffer, III, M.D. on 10/04/21 at 3:57 PM (EDT

## 2021-10-09 NOTE — Op Note (Signed)
Operative Note  Preoperative diagnosis:  1.  Bladder tumor--large  Postoperative diagnosis: 1.  Bladder tumor--large  Procedure(s): 1.  Transurethral resection of bladder tumor-status large  Surgeon: Link Snuffer, MD  Assistants: None  Anesthesia: General  Complications: None immediate  EBL: Difficult to quantify but likely 50 to 100 cc  Specimens: 1.  Bladder tumor  Drains/Catheters: 1.  22 French hematuria catheter on continuous bladder irrigation  Intraoperative findings: 1.  Papillary urethral mucosa consistent with urethral involvement of tumor.  This was primarily more proximal. 2.  Unable to visualize the right or left ureteral orifice.  I did not resect over the area of the left ureteral orifice.  I did have to resect and fulgurate over the area of the right ureteral orifice which is the site of obstruction and hydronephrosis.  Her entire bladder had a large amount of tumor.  Majority of the bladder had tumor involvement.  I resected a large amount of the posterior and right lateral wall as well as some of the left lateral wall and some of the anterior wall.  I resected maybe one third of the tumor.  There is still a large amount remaining.  Total tumor size well over 8 cm probably as it encompasses large amount of the bladder.  No sign of perforation.  Indication: 88 year old female with a large bladder tumor presents for the previously mentioned operation.  Also found to have right-sided hydronephrosis.  Description of procedure:  The patient was identified and consent was obtained.  The patient was taken to the operating room and placed in the supine position.  The patient was placed under general anesthesia.  Perioperative antibiotics were administered.  The patient was placed in dorsal lithotomy.  Patient was prepped and draped in a standard sterile fashion and a timeout was performed.  A 26 French resectoscope with the blunt obturator in place was advanced into the  urethra and into the bladder.  Complete cystoscopy was performed with findings noted above.  I exchanged for the bipolar working element.  I proceeded to resect a large amount of tumor on bipolar settings.  I primarily resected as described above.  There was a large amount of bladder tumor remaining.  I obtained good hemostasis but there was a large amount of tumor remaining that was quite friable.  I collected all of the bladder chips for specimen.  I reinspect the bladder mucosa and obtain some more hemostasis until there was good hemostasis.  I then withdrew the scope and performed continuous bladder irrigation.  This concluded the operation.  Patient tolerated the procedure well and stable postoperative.  Plan: I will admit her overnight for observation.  We will wean her off continuous bladder irrigation.

## 2021-10-09 NOTE — Discharge Instructions (Signed)

## 2021-10-09 NOTE — Anesthesia Preprocedure Evaluation (Addendum)
Anesthesia Evaluation  Patient identified by MRN, date of birth, ID band Patient awake    Reviewed: Allergy & Precautions, NPO status , Patient's Chart, lab work & pertinent test results  Airway Mallampati: II  TM Distance: >3 FB Neck ROM: Full    Dental no notable dental hx.    Pulmonary neg pulmonary ROS,    Pulmonary exam normal        Cardiovascular hypertension,  Rhythm:Regular Rate:Normal     Neuro/Psych negative neurological ROS  negative psych ROS   GI/Hepatic Neg liver ROS, hiatal hernia, GERD  Medicated,  Endo/Other  negative endocrine ROS  Renal/GU negative Renal ROS Bladder dysfunction  Bladder Ca    Musculoskeletal negative musculoskeletal ROS (+)   Abdominal Normal abdominal exam  (+)   Peds  Hematology negative hematology ROS (+)   Anesthesia Other Findings   Reproductive/Obstetrics                            Anesthesia Physical Anesthesia Plan  ASA: 2  Anesthesia Plan: General   Post-op Pain Management:    Induction: Intravenous  PONV Risk Score and Plan: 3 and Ondansetron, Dexamethasone and Treatment may vary due to age or medical condition  Airway Management Planned: Mask and LMA  Additional Equipment: None  Intra-op Plan:   Post-operative Plan: Extubation in OR  Informed Consent: I have reviewed the patients History and Physical, chart, labs and discussed the procedure including the risks, benefits and alternatives for the proposed anesthesia with the patient or authorized representative who has indicated his/her understanding and acceptance.     Dental advisory given  Plan Discussed with: CRNA  Anesthesia Plan Comments: (Lab Results      Component                Value               Date                      WBC                      10.9 (H)            10/06/2021                HGB                      14.1                10/06/2021                 HCT                      43.4                10/06/2021                MCV                      93.3                10/06/2021                PLT                      381  10/06/2021            Lab Results      Component                Value               Date                      NA                       136                 10/06/2021                K                        4.9                 10/06/2021                CO2                      28                  10/06/2021                GLUCOSE                  118 (H)             10/06/2021                BUN                      21                  10/06/2021                CREATININE               1.20 (H)            10/06/2021                CALCIUM                  9.2                 10/06/2021                GFRNONAA                 44 (L)              10/06/2021          )       Anesthesia Quick Evaluation

## 2021-10-09 NOTE — Anesthesia Procedure Notes (Signed)
Procedure Name: Intubation Date/Time: 10/09/2021 10:40 AM  Performed by: Claudia Desanctis, CRNAPre-anesthesia Checklist: Patient identified, Emergency Drugs available, Suction available and Patient being monitored Patient Re-evaluated:Patient Re-evaluated prior to induction Oxygen Delivery Method: Circle system utilized Preoxygenation: Pre-oxygenation with 100% oxygen Induction Type: IV induction Ventilation: Mask ventilation without difficulty Laryngoscope Size: 2 and Miller Grade View: Grade I Tube type: Oral Tube size: 7.0 mm Number of attempts: 1 Airway Equipment and Method: Stylet Placement Confirmation: ETT inserted through vocal cords under direct vision, positive ETCO2 and breath sounds checked- equal and bilateral Secured at: 20 cm Tube secured with: Tape Dental Injury: Teeth and Oropharynx as per pre-operative assessment

## 2021-10-10 ENCOUNTER — Encounter (HOSPITAL_COMMUNITY): Payer: Self-pay | Admitting: Urology

## 2021-10-10 DIAGNOSIS — C678 Malignant neoplasm of overlapping sites of bladder: Secondary | ICD-10-CM | POA: Diagnosis not present

## 2021-10-10 DIAGNOSIS — N130 Hydronephrosis with ureteropelvic junction obstruction: Secondary | ICD-10-CM | POA: Diagnosis not present

## 2021-10-10 DIAGNOSIS — C679 Malignant neoplasm of bladder, unspecified: Secondary | ICD-10-CM | POA: Diagnosis not present

## 2021-10-10 DIAGNOSIS — D494 Neoplasm of unspecified behavior of bladder: Secondary | ICD-10-CM | POA: Diagnosis not present

## 2021-10-10 LAB — BASIC METABOLIC PANEL
Anion gap: 5 (ref 5–15)
BUN: 15 mg/dL (ref 8–23)
CO2: 28 mmol/L (ref 22–32)
Calcium: 8.3 mg/dL — ABNORMAL LOW (ref 8.9–10.3)
Chloride: 105 mmol/L (ref 98–111)
Creatinine, Ser: 0.87 mg/dL (ref 0.44–1.00)
GFR, Estimated: >60 mL/min (ref >60)
Glucose, Bld: 105 mg/dL — ABNORMAL HIGH (ref 70–99)
Potassium: 4.4 mmol/L (ref 3.5–5.1)
Sodium: 138 mmol/L (ref 135–145)

## 2021-10-10 LAB — SURGICAL PATHOLOGY

## 2021-10-10 NOTE — Anesthesia Postprocedure Evaluation (Signed)
Anesthesia Post Note  Patient: Passenger transport manager  Procedure(s) Performed: TRANSURETHRAL RESECTION OF BLADDER TUMOR (TURBT) POSSIBLE BILATERAL RETROGRADE POSSIBLE RIGHT URETERAL STENT (Bilateral) POSSIBLE BILATERAL  RETROGRADE POSSIBLE RIGHT URETERA L STENT (Right)     Patient location during evaluation: PACU Anesthesia Type: General Level of consciousness: awake and alert Pain management: pain level controlled Vital Signs Assessment: post-procedure vital signs reviewed and stable Respiratory status: spontaneous breathing, nonlabored ventilation, respiratory function stable and patient connected to nasal cannula oxygen Cardiovascular status: blood pressure returned to baseline and stable Postop Assessment: no apparent nausea or vomiting Anesthetic complications: no   No notable events documented.  Last Vitals:  Vitals:   10/10/21 0034 10/10/21 0540  BP: 113/61 110/67  Pulse: 86 92  Resp: 17 16  Temp: 36.9 C 37 C  SpO2: 95% 96%    Last Pain:  Vitals:   10/10/21 0540  TempSrc: Oral  PainSc:                  Brandy Wheeler Brandy Wheeler

## 2021-10-10 NOTE — Discharge Summary (Signed)
Physician Discharge Summary  Patient ID: Brandy Wheeler MRN: [id number] DOB/AGE: January 01, 1932 88 y.o.  Admit date: 10/09/2021 Discharge date: 10/10/2021  Admission Diagnoses:  Discharge Diagnoses:  Principal Problem:   Bladder tumor   Discharged Condition: good  Hospital Course: Patient underwent a TURBT--large.  Patient tolerated the procedure well and was stable postoperative.  I kept her overnight under observation to ensure her urine cleared up.  She remained on CBI and was weaned off of this.  The following day, her urine was clear yellow and she was discharged home with Foley catheter.  Consults: None  Significant Diagnostic Studies: None  Treatments: surgery: As above  Discharge Exam: Blood pressure 110/67, pulse 92, temperature 98.6 F (37 C), temperature source Oral, resp. rate 16, height '5\' 3"'$  (1.6 m), weight 70.9 kg, SpO2 96 %. General appearance: alert, no acute distress Adequate perfusion of extremities Nonlabored respiration Abdomen soft, nontender, nondistended Urine draining clear yellow urine  Disposition: Discharge disposition: 01-Home or Self Care       Discharge Instructions     No wound care   Complete by: As directed       Allergies as of 10/10/2021   No Known Allergies      Medication List     TAKE these medications    BONE DENSITY BUILDER PO Take 1 tablet by mouth in the morning. [employer] Bone Builder Forte   cephALEXin 500 MG capsule Commonly known as: KEFLEX Take 1 capsule (500 mg total) by mouth 3 (three) times daily for 10 days.   COLON CLEANSE PO Take 1 capsule by mouth daily as needed (constipation.). Renew Life Cleanse More   ibuprofen 200 MG tablet Commonly known as: ADVIL Take 400 mg by mouth every 6 (six) hours as needed.   multivitamin with minerals Tabs tablet Take 1 tablet by mouth in the morning.   Myrbetriq 50 MG Tb24 tablet Generic drug: mirabegron ER Take 1 tablet (50 mg total) by mouth daily.    omeprazole 20 MG capsule Commonly known as: PRILOSEC Take 20 mg by mouth in the morning.   oxyCODONE 5 MG immediate release tablet Commonly known as: Oxy IR/ROXICODONE Take 1 tablet (5 mg total) by mouth every 4 (four) hours as needed for severe pain.   VITAMIN B-12 PO Take 1 tablet by mouth in the morning.   VITAMIN D3 PO Take 1 tablet by mouth in the morning.         Signed: Marton Redwood, III 10/10/2021, 8:55 PM

## 2021-10-10 NOTE — Progress Notes (Signed)
Patient discharged home with family, IV removed - WNL.  Reviewed AVS, medications, and foley management at home.  Family able to demonstrate satisfactorily.  No questions at this time, verbalized understanding.  Patient assisted off unit in NAD

## 2021-10-10 NOTE — TOC Transition Note (Signed)
Transition of Care [facility]) - CM/SW Discharge Note   Patient Details  Name: Brandy Wheeler MRN: [id number] Date of Birth: 1932/04/19  [facility] CM/SW Contact:  Leeroy Cha, RN Phone Number: 10/10/2021, 8:16 AM   Clinical Narrative:    Patient dcd to home with self care.  Orders checked no toc needs present. Has support through the daughter.   Final next level of care: Home/Self Care Barriers to Discharge: No Barriers Identified   Patient Goals and CMS Choice Patient states their goals for this hospitalization and ongoing recovery are:: to go home CMS Medicare.gov Compare Post Acute Care list provided to:: Patient    Discharge Placement                       Discharge Plan and Services   Discharge Planning Services: CM Consult                                 Social Determinants of Health (SDOH) Interventions     Readmission Risk Interventions     No data to display

## 2021-10-12 DIAGNOSIS — N130 Hydronephrosis with ureteropelvic junction obstruction: Secondary | ICD-10-CM | POA: Diagnosis not present

## 2021-10-12 DIAGNOSIS — C678 Malignant neoplasm of overlapping sites of bladder: Secondary | ICD-10-CM | POA: Diagnosis not present

## 2021-10-13 ENCOUNTER — Telehealth: Payer: Self-pay | Admitting: Oncology

## 2021-10-17 DIAGNOSIS — C679 Malignant neoplasm of bladder, unspecified: Secondary | ICD-10-CM | POA: Diagnosis not present

## 2021-10-17 DIAGNOSIS — N135 Crossing vessel and stricture of ureter without hydronephrosis: Secondary | ICD-10-CM | POA: Diagnosis not present

## 2021-10-17 DIAGNOSIS — K5730 Diverticulosis of large intestine without perforation or abscess without bleeding: Secondary | ICD-10-CM | POA: Diagnosis not present

## 2021-10-17 DIAGNOSIS — R911 Solitary pulmonary nodule: Secondary | ICD-10-CM | POA: Diagnosis not present

## 2021-10-17 DIAGNOSIS — I700 Atherosclerosis of aorta: Secondary | ICD-10-CM | POA: Diagnosis not present

## 2021-10-17 DIAGNOSIS — K449 Diaphragmatic hernia without obstruction or gangrene: Secondary | ICD-10-CM | POA: Diagnosis not present

## 2021-10-17 DIAGNOSIS — J984 Other disorders of lung: Secondary | ICD-10-CM | POA: Diagnosis not present

## 2021-10-17 DIAGNOSIS — N3289 Other specified disorders of bladder: Secondary | ICD-10-CM | POA: Diagnosis not present

## 2021-10-17 DIAGNOSIS — C678 Malignant neoplasm of overlapping sites of bladder: Secondary | ICD-10-CM | POA: Diagnosis not present

## 2021-10-26 DIAGNOSIS — C678 Malignant neoplasm of overlapping sites of bladder: Secondary | ICD-10-CM | POA: Diagnosis not present

## 2021-11-03 ENCOUNTER — Encounter: Payer: Self-pay | Admitting: Radiology

## 2021-11-03 ENCOUNTER — Other Ambulatory Visit: Payer: Self-pay

## 2021-11-03 ENCOUNTER — Inpatient Hospital Stay: Payer: Medicare HMO | Attending: Oncology | Admitting: Oncology

## 2021-11-03 VITALS — BP 166/71 | HR 95 | Temp 97.8°F | Resp 17 | Ht 63.0 in | Wt 152.1 lb

## 2021-11-03 DIAGNOSIS — I10 Essential (primary) hypertension: Secondary | ICD-10-CM

## 2021-11-03 DIAGNOSIS — Z5111 Encounter for antineoplastic chemotherapy: Secondary | ICD-10-CM | POA: Insufficient documentation

## 2021-11-03 DIAGNOSIS — C679 Malignant neoplasm of bladder, unspecified: Secondary | ICD-10-CM

## 2021-11-03 DIAGNOSIS — Z90710 Acquired absence of both cervix and uterus: Secondary | ICD-10-CM | POA: Diagnosis not present

## 2021-11-03 DIAGNOSIS — K219 Gastro-esophageal reflux disease without esophagitis: Secondary | ICD-10-CM | POA: Insufficient documentation

## 2021-11-03 MED ORDER — PROCHLORPERAZINE MALEATE 10 MG PO TABS: 10.0000 mg | ORAL_TABLET | Freq: Four times a day (QID) | ORAL | 0 refills | Status: DC | PRN

## 2021-11-03 MED ORDER — LIDOCAINE-PRILOCAINE 2.5-2.5 % EX CREA
1.0000 | TOPICAL_CREAM | CUTANEOUS | 0 refills | Status: DC | PRN
Start: 2021-11-03 — End: 2022-04-29

## 2021-11-03 NOTE — Progress Notes (Signed)
Reason for the request:    Bladder cancer  HPI: I was asked by Dr. Tresa Moore to evaluate Brandy Wheeler for the evaluation of bladder cancer.  She is an 88 year old woman with history of GERD with no other significant comorbid conditions.  She presented to the emergency department in June 2023 with symptoms of hematuria and CT scan at that time showed thickening of the urinary bladder that is concerning for a bladder tumor with obstruction of the right ureterovesicular junction.  Mild right hydronephrosis was noted.  Based on these findings she underwent TURBT by Dr. 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  Clinically, she reports few complaints at this time predominantly nausea but no vomiting she has also reported some weight loss.  She denies any hematuria or dysuria but does report urinary incontinence.  If she denies any flank pain or bone pain.  She denies any pathological fractures.   She does not report any headaches, blurry vision, syncope or seizures. Does not report any fevers, chills or sweats.  Does not report any cough, wheezing or hemoptysis.  Does not report any chest pain, palpitation, orthopnea or leg edema.  Does not report any nausea, vomiting or abdominal pain.  Does not report any constipation or diarrhea.  Does not report any skeletal complaints.    Does not report frequency, urgency or hematuria.  Does not report any skin rashes or lesions. Does not report any heat or cold intolerance.  Does not report any lymphadenopathy or petechiae.  Does not report any anxiety or depression.  Remaining review of systems is negative.     Past Medical History:  Diagnosis Date   Diverticulitis    Fracture of right fibula 10/10/2013   closed   GERD (gastroesophageal reflux disease)    History of hiatal hernia    White coat syndrome with high blood pressure but without hypertension   :   Past Surgical  History:  Procedure Laterality Date   ABDOMINAL HYSTERECTOMY  1978   APPENDECTOMY     AGE 51   CYSTOSCOPY W/ URETERAL STENT PLACEMENT Right 10/09/2021   Procedure: POSSIBLE BILATERAL  RETROGRADE POSSIBLE RIGHT Melene Muller STENT;  Surgeon: Lucas Mallow, MD;  Location: [facility];  Service: Urology;  Laterality: Right;   [place]   to lift uterus prior to getting pregnant   TRANSURETHRAL RESECTION OF BLADDER TUMOR Bilateral 10/09/2021   Procedure: TRANSURETHRAL RESECTION OF BLADDER TUMOR (TURBT) POSSIBLE BILATERAL RETROGRADE POSSIBLE RIGHT URETERAL STENT;  Surgeon: Lucas Mallow, MD;  Location: [facility];  Service: Urology;  Laterality: Bilateral;  :   Current Outpatient Medications:    Cholecalciferol (VITAMIN D3 PO), Take 1 tablet by mouth in the morning., Disp: , Rfl:    Cyanocobalamin (VITAMIN B-12 PO), Take 1 tablet by mouth in the morning., Disp: , Rfl:    ibuprofen (ADVIL) 200 MG tablet, Take 400 mg by mouth every 6 (six) hours as needed., Disp: , Rfl:    Misc Natural Products (COLON CLEANSE PO), Take 1 capsule by mouth daily as needed (constipation.). Renew Life Cleanse More, Disp: , Rfl:    Multiple Minerals-Vitamins (BONE DENSITY BUILDER PO), Take 1 tablet by mouth in the morning. Metagenics Bone Builder Forte, Disp: , Rfl:    omeprazole (PRILOSEC) 20 MG capsule, Take 20 mg by mouth in the morning., Disp: , Rfl:    mirabegron  ER (MYRBETRIQ) 50 MG TB24 tablet, Take 1 tablet (50 mg total) by mouth daily., Disp: 30 tablet, Rfl: 0   Multiple Vitamin (MULTIVITAMIN WITH MINERALS) TABS tablet, Take 1 tablet by mouth in the morning., Disp: , Rfl:    oxyCODONE (OXY IR/ROXICODONE) 5 MG immediate release tablet, Take 1 tablet (5 mg total) by mouth every 4 (four) hours as needed for severe pain. (Patient not taking: Reported on 11/03/2021), Disp: 20 tablet, Rfl: 0:  No Known Allergies:  No family history on file.:   Social History    Socioeconomic History   Marital status: Divorced    Spouse name: Not on file   Number of children: Not on file   Years of education: Not on file   Highest education level: Not on file  Occupational History   Not on file  Tobacco Use   Smoking status: Never   Smokeless tobacco: Never  Vaping Use   Vaping Use: Never used  Substance and Sexual Activity   Alcohol use: Yes   Drug use: No   Sexual activity: Not on file  Other Topics Concern   Not on file  Social History Narrative   Not on file   Social Determinants of Health   Financial Resource Strain: Not on file  Food Insecurity: Not on file  Transportation Needs: Not on file  Physical Activity: Not on file  Stress: Not on file  Social Connections: Not on file  Intimate Partner Violence: Not on file  :  Pertinent items are noted in HPI.  Exam: Blood pressure (!) 166/71, pulse 95, temperature 97.8 F (36.6 C), temperature source Temporal, resp. rate 17, height '5\' 3"'$  (1.6 m), weight 152 lb 1.6 oz (69 kg), SpO2 100 %. ECOG 1 General appearance: alert and cooperative appeared without distress. Head: atraumatic without any abnormalities. Eyes: conjunctivae/corneas clear. PERRL.  Sclera anicteric. Throat: lips, mucosa, and tongue normal; without oral thrush or ulcers. Resp: clear to auscultation bilaterally without rhonchi, wheezes or dullness to percussion. Cardio: regular rate and rhythm, S1, S2 normal, no murmur, click, rub or gallop GI: soft, non-tender; bowel sounds normal; no masses,  no organomegaly Skin: Skin color, texture, turgor normal. No rashes or lesions Lymph nodes: Cervical, supraclavicular, and axillary nodes normal. Neurologic: Grossly normal without any motor, sensory or deep tendon reflexes. Musculoskeletal: No joint deformity or effusion.    Assessment and Plan:   88 year old with:  1.  Bladder cancer diagnosed in June 2023.  She presented with hematuria and found to have invasive high-grade  urothelial carcinoma invading into the lamina propria with a CT scan indicating perivesicular involvement at least T3 disease.  CT scan chest abdomen pelvis did not show any evidence of metastatic disease.  The natural course of this disease was reviewed today with the patient and her family.  Treatment options were discussed.  Radical cystectomy following neoadjuvant chemotherapy were reviewed versus definitive therapy with radiation concurrently with chemotherapy.  Risks and benefits of all these approaches were discussed at this time.  And she opted to proceed with surgery.    The logistics and rationale for using chemotherapy was reviewed today in detail.  Complication associated with cisplatin and gemcitabine chemotherapy was discussed.  These complications include nausea, vomiting, myelosuppression, fatigue, infusion related complications, renal insufficiency, neutropenia, neutropenic sepsis and rarely serious thrombosis, hospitalization and death.  The benefit would also if he has an excellent response to chemotherapy, curative surgical resection may be attempted.  The plan is to treat with gemcitabine and  cisplatin on day 1, gemcitabine day 8 out of a 21-day cycle.  Anticipate needing 3 cycles of therapy    After discussion today, he is agreeable to proceed after chemo education class.     2.  IV access: Risks and benefits of using Port-A-Cath versus peripheral veins was discussed today.  Complication associated with Port-A-Cath insertion include bleeding, infection and thrombosis.  After discussing the risks and benefits, she is agreeable to proceed.   3.  Antiemetics: Prescription for Compazine was made available to him.   4.  Renal function surveillance: will continue to monitor on cisplatin therapy.  Baseline kidney function is normal.   5.  Goals of care: Therapy is curative.   6.  Follow-up: will be in the immediate future to start chemotherapy.  60  minutes were dedicated to this  visit. The time was spent on reviewing laboratory data, imaging studies, discussing treatment options, and answering questions regarding future plan.     A copy of this consult has been forwarded to the requesting physician.

## 2021-11-03 NOTE — Research (Unsigned)
MTG-015 - Tissue and Bodily Fluids: Translational Medicine: Discovery and Evaluation of Biomarkers/Pharmacogenomics for the Diagnosis and Personalized Management of Patients    11/03/2021  CONSENT INTRO: Patient Brandy Wheeler was identified by Dr. Alen Blew as a potential candidate for the above listed study.  This Clinical Research Coordinator met with Margree Gimbel, [id number], on 11/03/21 in a manner and location that ensures patient privacy to discuss participation in the above listed research study.  Patient is Accompanied by family .  A copy of the informed consent document and separate HIPAA Authorization was provided to the patient.  Patient reads, speaks, and understands [place].   Patient was provided with the business card of this Coordinator and encouraged to contact the research team with any questions.  Approximately 10 minutes were spent with the patient reviewing the informed consent documents.  Patient was provided the option of taking informed consent documents home to review and was encouraged to review at their convenience with their support network, including other care providers. Patient took the consent documents home to review.  Will follow up with patient next week to verify any questions/concerns or potential interest in the above mentioned study. Thanked patient and her family for their time.   Carol Ada, RT(R)(T) Clinical Research Coordinator

## 2021-11-03 NOTE — Progress Notes (Signed)
START ON PATHWAY REGIMEN - Bladder     A cycle is every 21 days:     Gemcitabine      Cisplatin   **Always confirm dose/schedule in your pharmacy ordering system**  Patient Characteristics: Pre-Cystectomy or Nonsurgical Candidate (Clinical Staging), cT2-4a, cN0-1, M0, Cystectomy Eligible, Cisplatin-Based Chemotherapy Indicated with CrCl ? 60 mL/min and Minimal or No Symptoms Therapeutic Status: Pre-Cystectomy or Nonsurgical Candidate (Clinical Staging) AJCC M Category: cM0 AJCC 8 Stage Grouping: IIIA AJCC T Category: cT3 AJCC N Category: cN0 Intent of Therapy: Curative Intent, Discussed with Patient

## 2021-11-07 ENCOUNTER — Encounter: Payer: Self-pay | Admitting: Oncology

## 2021-11-07 ENCOUNTER — Telehealth: Payer: Self-pay | Admitting: Radiology

## 2021-11-07 ENCOUNTER — Telehealth: Payer: Self-pay | Admitting: Oncology

## 2021-11-07 NOTE — Telephone Encounter (Signed)
MTG-015 - Tissue and Bodily Fluids: Translational Medicine: Discovery and Evaluation of Biomarkers/Pharmacogenomics for the Diagnosis and Personalized Management of Patients    11/07/2021  PHONE CALL: Confirmed I was speaking with Lenox Ahr . Informed patient reason for call is to follow up on the above mentioned study. Patient is not interested in participation. This coordinator expressed understanding and thanked patient for her time and consideration of the above mentioned study.   Carol Ada, RT(R)(T) Clinical Research Coordinator

## 2021-11-07 NOTE — Telephone Encounter (Signed)
Scheduled per 07/14 los, patient has been called and notified of all upcoming appointments.

## 2021-11-10 ENCOUNTER — Inpatient Hospital Stay: Payer: Medicare HMO

## 2021-11-13 ENCOUNTER — Encounter: Payer: Self-pay | Admitting: Oncology

## 2021-11-13 ENCOUNTER — Other Ambulatory Visit: Payer: Self-pay

## 2021-11-13 NOTE — Progress Notes (Signed)
Patient called after receiving my card per my request. Patient wanted to know her OOP amount to pay for her port placement on Monday, Advised patient a member of the Pre-Service team would call and provide that information to her. She verbalized understanding.  Introduced myself as Arboriculturist and to offer available resources. Discussed one-time $1000 Radio broadcast assistant to assist with personal expenses while going through treatment. Advised what is needed to apply and she may provide on 7/27 at next visit to registrar to scan and email to me. She will then be provided with grant paperwork to sign. Advised to contact me the next day or at earliest convenience to discuss grant details.  She has my card for any additional financial questions or concerns.

## 2021-11-14 ENCOUNTER — Other Ambulatory Visit: Payer: Self-pay | Admitting: Internal Medicine

## 2021-11-14 DIAGNOSIS — C679 Malignant neoplasm of bladder, unspecified: Secondary | ICD-10-CM

## 2021-11-15 ENCOUNTER — Encounter (HOSPITAL_COMMUNITY): Payer: Self-pay

## 2021-11-15 ENCOUNTER — Ambulatory Visit (HOSPITAL_COMMUNITY)
Admission: RE | Admit: 2021-11-15 | Discharge: 2021-11-15 | Disposition: A | Discharge status: Home / Self Care | Payer: Medicare HMO | Source: Ambulatory Visit | Attending: Oncology | Admitting: Oncology

## 2021-11-15 ENCOUNTER — Other Ambulatory Visit: Payer: Medicare HMO

## 2021-11-15 ENCOUNTER — Other Ambulatory Visit: Payer: Self-pay | Admitting: Oncology

## 2021-11-15 ENCOUNTER — Ambulatory Visit: Payer: Medicare HMO

## 2021-11-15 DIAGNOSIS — Z452 Encounter for adjustment and management of vascular access device: Secondary | ICD-10-CM | POA: Diagnosis not present

## 2021-11-15 DIAGNOSIS — C679 Malignant neoplasm of bladder, unspecified: Secondary | ICD-10-CM | POA: Diagnosis not present

## 2021-11-15 DIAGNOSIS — K449 Diaphragmatic hernia without obstruction or gangrene: Secondary | ICD-10-CM | POA: Diagnosis not present

## 2021-11-15 DIAGNOSIS — K219 Gastro-esophageal reflux disease without esophagitis: Secondary | ICD-10-CM | POA: Insufficient documentation

## 2021-11-15 HISTORY — PX: IR IMAGING GUIDED PORT INSERTION: IMG5740

## 2021-11-15 MED ORDER — LIDOCAINE-EPINEPHRINE 1 %-1:100000 IJ SOLN
INTRAMUSCULAR | Status: AC
  Filled 2021-11-15: qty 1

## 2021-11-15 MED ORDER — SODIUM CHLORIDE 0.9 % IV SOLN: INTRAVENOUS | Status: DC

## 2021-11-15 MED ORDER — MIDAZOLAM HCL 2 MG/2ML IJ SOLN
INTRAMUSCULAR | Status: AC
  Filled 2021-11-15: qty 4

## 2021-11-15 MED ORDER — HEPARIN SOD (PORK) LOCK FLUSH 100 UNIT/ML IV SOLN
INTRAVENOUS | Status: AC | PRN
  Administered 2021-11-15: 500 [IU] via INTRAVENOUS

## 2021-11-15 MED ORDER — HEPARIN SOD (PORK) LOCK FLUSH 100 UNIT/ML IV SOLN
INTRAVENOUS | Status: AC
  Filled 2021-11-15: qty 5

## 2021-11-15 MED ORDER — MIDAZOLAM HCL 2 MG/2ML IJ SOLN
INTRAMUSCULAR | Status: AC | PRN
  Administered 2021-11-15 (×2): 1 mg via INTRAVENOUS

## 2021-11-15 MED ORDER — LIDOCAINE-EPINEPHRINE 1 %-1:100000 IJ SOLN
INTRAMUSCULAR | Status: AC | PRN
  Administered 2021-11-15: 20 mL

## 2021-11-15 MED ORDER — FENTANYL CITRATE (PF) 100 MCG/2ML IJ SOLN
INTRAMUSCULAR | Status: AC | PRN
  Administered 2021-11-15 (×2): 50 ug via INTRAVENOUS

## 2021-11-15 MED ORDER — FENTANYL CITRATE (PF) 100 MCG/2ML IJ SOLN
INTRAMUSCULAR | Status: AC
  Filled 2021-11-15: qty 4

## 2021-11-15 MED FILL — Fosaprepitant Dimeglumine For IV Infusion 150 MG (Base Eq): INTRAVENOUS | Qty: 5 | Status: AC

## 2021-11-15 MED FILL — Dexamethasone Sodium Phosphate Inj 100 MG/10ML: INTRAMUSCULAR | Qty: 1 | Status: AC

## 2021-11-15 NOTE — Discharge Instructions (Signed)
Leave dressing on for 24 hours.  You may shower after 24 hours.  Please remove the dressing before you shower.

## 2021-11-15 NOTE — Procedures (Signed)
  Procedure:  R IJ Port catheter placement   Preprocedure diagnosis: The encounter diagnosis was Malignant neoplasm of urinary bladder, unspecified site ([place]).  Postprocedure diagnosis: same EBL:    minimal Complications:   none immediate  See full dictation in [employer].  Dillard Cannon MD Main # [phone] Pager  [phone] Mobile [phone]

## 2021-11-15 NOTE — Consult Note (Signed)
Chief Complaint: Patient was seen in consultation today for port a cath placement  Referring Physician(s): Wyatt Portela  Supervising Physician: Arne Cleveland  Patient Status: [facility] - Out-pt  History of Present Illness: Brandy Wheeler is an 88 y.o. female with past medical history significant for diverticulosis, GERD, hiatal hernia, and now with newly diagnosed bladder cancer, status post TURBT on 10/09/2021.  She presents today for Port-A-Cath placement to assist with treatment.  Past Medical History:  Diagnosis Date   Diverticulitis    Fracture of right fibula 10/10/2013   closed   GERD (gastroesophageal reflux disease)    History of hiatal hernia    White coat syndrome with high blood pressure but without hypertension     Past Surgical History:  Procedure Laterality Date   ABDOMINAL HYSTERECTOMY  1978   APPENDECTOMY     AGE 51   CYSTOSCOPY W/ URETERAL STENT PLACEMENT Right 10/09/2021   Procedure: POSSIBLE BILATERAL  RETROGRADE POSSIBLE RIGHT Melene Muller STENT;  Surgeon: Lucas Mallow, MD;  Location: [facility];  Service: Urology;  Laterality: Right;   [place]   to lift uterus prior to getting pregnant   TRANSURETHRAL RESECTION OF BLADDER TUMOR Bilateral 10/09/2021   Procedure: TRANSURETHRAL RESECTION OF BLADDER TUMOR (TURBT) POSSIBLE BILATERAL RETROGRADE POSSIBLE RIGHT URETERAL STENT;  Surgeon: Lucas Mallow, MD;  Location: [facility];  Service: Urology;  Laterality: Bilateral;    Allergies: Patient has no known allergies.  Medications: Prior to Admission medications   Medication Sig Start Date End Date Taking? Authorizing Provider  Cholecalciferol (VITAMIN D3 PO) Take 1 tablet by mouth in the morning.   Yes [provider]  Cyanocobalamin (VITAMIN B-12 PO) Take 1 tablet by mouth in the morning.   Yes [provider]  Misc Natural Products (COLON CLEANSE PO) Take 1 capsule by mouth daily as needed  (constipation.). Renew Life Cleanse More   Yes [provider]  Multiple Minerals-Vitamins (BONE DENSITY BUILDER PO) Take 1 tablet by mouth in the morning. Metagenics Bone Builder Forte   Yes [provider]  Multiple Vitamin (MULTIVITAMIN WITH MINERALS) TABS tablet Take 1 tablet by mouth in the morning.   Yes [provider]  omeprazole (PRILOSEC) 20 MG capsule Take 20 mg by mouth in the morning.   Yes [provider]  prochlorperazine (COMPAZINE) 10 MG tablet Take 1 tablet (10 mg total) by mouth every 6 (six) hours as needed for nausea or vomiting. 11/03/21  Yes Wyatt Portela, MD  ibuprofen (ADVIL) 200 MG tablet Take 400 mg by mouth every 6 (six) hours as needed.    [provider]  lidocaine-prilocaine (EMLA) cream Apply 1 Application topically as needed. 11/03/21   Wyatt Portela, MD  mirabegron ER (MYRBETRIQ) 50 MG TB24 tablet Take 1 tablet (50 mg total) by mouth daily. 10/03/21 11/02/21  Gareth Morgan, MD  oxyCODONE (OXY IR/ROXICODONE) 5 MG immediate release tablet Take 1 tablet (5 mg total) by mouth every 4 (four) hours as needed for severe pain. Patient not taking: Reported on 11/03/2021 10/09/21   Lucas Mallow, MD     History reviewed. No pertinent family history.  Social History   Socioeconomic History   Marital status: Divorced    Spouse name: Not on file   Number of children: Not on file   Years of education: Not on file   Highest education level: Not on file  Occupational History   Not  on file  Tobacco Use   Smoking status: Never   Smokeless tobacco: Never  Vaping Use   Vaping Use: Never used  Substance and Sexual Activity   Alcohol use: Yes   Drug use: No   Sexual activity: Not on file  Other Topics Concern   Not on file  Social History Narrative   Not on file   Social Determinants of Health   Financial Resource Strain: Not on file  Food Insecurity: Not on file  Transportation Needs: Not on file  Physical  Activity: Not on file  Stress: Not on file  Social Connections: Not on file      Review of Systems she currently denies fever, headache, chest pain, dyspnea, cough, abdominal pain, pain, nausea, vomiting or visible bleeding.  She is hard of hearing and does complain of some anterior right lower leg discomfort and urinary incontinence.  Mild dysuria.  Vital Signs: BP (!) 166/82 (BP Location: Right Arm)   Pulse (!) 117   Temp 97.8 F (36.6 C) (Oral)   Resp 15   SpO2 96%      Physical Exam awake, alert.  Chest clear to auscultation bilaterally.  Heart with tachycardic but regular rhythm.  Abdomen soft, positive bowel sounds, currently nontender.  Bilateral pretibial edema noted.  Some palpable discomfort along patient's right anterior lower leg/shin region; varicosities noted  Imaging: No results found.  Labs:  CBC: Recent Labs    10/03/21 0620 10/06/21 1355  WBC 9.3 10.9*  HGB 13.6 14.1  HCT 41.1 43.4  PLT 319 381    COAGS: No results for input(s): "INR", "APTT" in the last 8760 hours.  BMP: Recent Labs    10/03/21 0620 10/06/21 1355 10/09/21 1425 10/10/21 0405  NA 136 136 134* 138  K 3.8 4.9 4.4 4.4  CL 102 102 105 105  CO2 27 28 19* 28  GLUCOSE 124* 118* 92 105*  BUN 26* '21 13 15  '$ CALCIUM 8.5* 9.2 7.1* 8.3*  CREATININE 1.33* 1.20* 0.61 0.87  GFRNONAA 38* 44* >60 >60    LIVER FUNCTION TESTS: Recent Labs    10/03/21 0620  BILITOT 0.6  AST 15  ALT 10  ALKPHOS 56  PROT 6.3*  ALBUMIN 3.1*    TUMOR MARKERS: No results for input(s): "AFPTM", "CEA", "CA199", "CHROMGRNA" in the last 8760 hours.  Assessment and Plan: 88 y.o. female with past medical history significant for diverticulosis, GERD, hiatal hernia, and now with newly diagnosed bladder cancer, status post TURBT on 10/09/2021.  She presents today for Port-A-Cath placement to assist with treatment.Risks and benefits of image guided port-a-catheter placement was discussed with the patient  including, but not limited to bleeding, infection, pneumothorax, or fibrin sheath development and need for additional procedures.  All of the patient's questions were answered, patient is agreeable to proceed. Consent signed and in chart.    Thank you for this interesting consult.  I greatly enjoyed meeting Brandy Wheeler and look forward to participating in their care.  A copy of this report was sent to the requesting provider on this date.  Electronically Signed: D. Rowe Robert, PA-C 11/15/2021, 10:22 AM   I spent a total of  25 minutes   in face to face in clinical consultation, greater than 50% of which was counseling/coordinating care for Port-A-Cath placement

## 2021-11-16 ENCOUNTER — Inpatient Hospital Stay: Payer: Medicare HMO

## 2021-11-16 ENCOUNTER — Other Ambulatory Visit: Payer: Self-pay

## 2021-11-16 VITALS — BP 156/60 | HR 97 | Temp 97.8°F | Resp 17 | Wt 154.5 lb

## 2021-11-16 DIAGNOSIS — Z90710 Acquired absence of both cervix and uterus: Secondary | ICD-10-CM | POA: Diagnosis not present

## 2021-11-16 DIAGNOSIS — Z95828 Presence of other vascular implants and grafts: Secondary | ICD-10-CM

## 2021-11-16 DIAGNOSIS — C679 Malignant neoplasm of bladder, unspecified: Secondary | ICD-10-CM

## 2021-11-16 DIAGNOSIS — K219 Gastro-esophageal reflux disease without esophagitis: Secondary | ICD-10-CM | POA: Diagnosis not present

## 2021-11-16 DIAGNOSIS — Z5111 Encounter for antineoplastic chemotherapy: Secondary | ICD-10-CM | POA: Diagnosis not present

## 2021-11-16 LAB — CMP (CANCER CENTER ONLY)
ALT: 18 U/L (ref 0–44)
AST: 26 U/L (ref 15–41)
Albumin: 3.2 g/dL — ABNORMAL LOW (ref 3.5–5.0)
Alkaline Phosphatase: 135 U/L — ABNORMAL HIGH (ref 38–126)
Anion gap: 6 (ref 5–15)
BUN: 22 mg/dL (ref 8–23)
CO2: 29 mmol/L (ref 22–32)
Calcium: 8.6 mg/dL — ABNORMAL LOW (ref 8.9–10.3)
Chloride: 98 mmol/L (ref 98–111)
Creatinine: 0.86 mg/dL (ref 0.44–1.00)
GFR, Estimated: >60 mL/min (ref >60)
Glucose, Bld: 124 mg/dL — ABNORMAL HIGH (ref 70–99)
Potassium: 4.1 mmol/L (ref 3.5–5.1)
Sodium: 133 mmol/L — ABNORMAL LOW (ref 135–145)
Total Bilirubin: 0.6 mg/dL (ref 0.3–1.2)
Total Protein: 6.3 g/dL — ABNORMAL LOW (ref 6.5–8.1)

## 2021-11-16 LAB — MAGNESIUM: Magnesium: 2 mg/dL (ref 1.7–2.4)

## 2021-11-16 LAB — CBC WITH DIFFERENTIAL (CANCER CENTER ONLY)
Abs Immature Granulocytes: 0.06 10*3/uL (ref 0.00–0.07)
Basophils Absolute: 0.1 10*3/uL (ref 0.0–0.1)
Basophils Relative: 0 %
Eosinophils Absolute: 0 10*3/uL (ref 0.0–0.5)
Eosinophils Relative: 0 %
HCT: 35.7 % — ABNORMAL LOW (ref 36.0–46.0)
Hemoglobin: 11.7 g/dL — ABNORMAL LOW (ref 12.0–15.0)
Immature Granulocytes: 1 %
Lymphocytes Relative: 23 %
Lymphs Abs: 2.7 10*3/uL (ref 0.7–4.0)
MCH: 28.8 pg (ref 26.0–34.0)
MCHC: 32.8 g/dL (ref 30.0–36.0)
MCV: 87.9 fL (ref 80.0–100.0)
Monocytes Absolute: 0.9 10*3/uL (ref 0.1–1.0)
Monocytes Relative: 8 %
Neutro Abs: 7.9 10*3/uL — ABNORMAL HIGH (ref 1.7–7.7)
Neutrophils Relative %: 68 %
Platelet Count: 407 10*3/uL — ABNORMAL HIGH (ref 150–400)
RBC: 4.06 MIL/uL (ref 3.87–5.11)
RDW: 14 % (ref 11.5–15.5)
WBC Count: 11.6 10*3/uL — ABNORMAL HIGH (ref 4.0–10.5)
nRBC: 0 % (ref 0.0–0.2)

## 2021-11-16 MED ORDER — POTASSIUM CHLORIDE IN NACL 20-0.9 MEQ/L-% IV SOLN
Freq: Once | INTRAVENOUS | Status: AC
  Filled 2021-11-16: qty 1000

## 2021-11-16 MED ORDER — PALONOSETRON HCL INJECTION 0.25 MG/5ML
0.2500 mg | Freq: Once | INTRAVENOUS | Status: AC
  Administered 2021-11-16: 0.25 mg via INTRAVENOUS
  Filled 2021-11-16: qty 5

## 2021-11-16 MED ORDER — SODIUM CHLORIDE 0.9% FLUSH
10.0000 mL | INTRAVENOUS | Status: DC | PRN
  Administered 2021-11-16: 10 mL

## 2021-11-16 MED ORDER — HEPARIN SOD (PORK) LOCK FLUSH 100 UNIT/ML IV SOLN
500.0000 [IU] | Freq: Once | INTRAVENOUS | Status: AC | PRN
  Administered 2021-11-16: 500 [IU]

## 2021-11-16 MED ORDER — SODIUM CHLORIDE 0.9 % IV SOLN
800.0000 mg/m2 | Freq: Once | INTRAVENOUS | Status: AC
  Administered 2021-11-16: 1406 mg via INTRAVENOUS
  Filled 2021-11-16: qty 36.98

## 2021-11-16 MED ORDER — SODIUM CHLORIDE 0.9 % IV SOLN
10.0000 mg | Freq: Once | INTRAVENOUS | Status: AC
  Administered 2021-11-16: 10 mg via INTRAVENOUS
  Filled 2021-11-16: qty 10

## 2021-11-16 MED ORDER — MAGNESIUM SULFATE 2 GM/50ML IV SOLN
2.0000 g | Freq: Once | INTRAVENOUS | Status: AC
  Administered 2021-11-16: 2 g via INTRAVENOUS
  Filled 2021-11-16: qty 50

## 2021-11-16 MED ORDER — SODIUM CHLORIDE 0.9 % IV SOLN
50.0000 mg/m2 | Freq: Once | INTRAVENOUS | Status: AC
  Administered 2021-11-16: 88 mg via INTRAVENOUS
  Filled 2021-11-16: qty 88

## 2021-11-16 MED ORDER — SODIUM CHLORIDE 0.9% FLUSH
10.0000 mL | Freq: Once | INTRAVENOUS | Status: AC
  Administered 2021-11-16: 10 mL

## 2021-11-16 MED ORDER — SODIUM CHLORIDE 0.9 % IV SOLN: Freq: Once | INTRAVENOUS | Status: AC

## 2021-11-16 MED ORDER — SODIUM CHLORIDE 0.9 % IV SOLN
150.0000 mg | Freq: Once | INTRAVENOUS | Status: AC
  Administered 2021-11-16: 150 mg via INTRAVENOUS
  Filled 2021-11-16: qty 150

## 2021-11-16 NOTE — Patient Instructions (Signed)
[facility]  Discharge Instructions: Thank you for choosing [facility] to provide your oncology and hematology care.   If you have a lab appointment with the [facility], please go directly to the [facility] and check in at the registration area.   Wear comfortable clothing and clothing appropriate for easy access to any Portacath or PICC line.   We strive to give you quality time with your provider. You may need to reschedule your appointment if you arrive late (15 or more minutes).  Arriving late affects you and other patients whose appointments are after yours.  Also, if you miss three or more appointments without notifying the office, you may be dismissed from the clinic at the provider's discretion.      For prescription refill requests, have your pharmacy contact our office and allow 72 hours for refills to be completed.    Today you received the following chemotherapy and/or immunotherapy agents: Cisplatin, Gemzar      To help prevent nausea and vomiting after your treatment, we encourage you to take your nausea medication as directed.  BELOW ARE SYMPTOMS THAT SHOULD BE REPORTED IMMEDIATELY: *FEVER GREATER THAN 100.4 F (38 C) OR HIGHER *CHILLS OR SWEATING *NAUSEA AND VOMITING THAT IS NOT CONTROLLED WITH YOUR NAUSEA MEDICATION *UNUSUAL SHORTNESS OF BREATH *UNUSUAL BRUISING OR BLEEDING *URINARY PROBLEMS (pain or burning when urinating, or frequent urination) *BOWEL PROBLEMS (unusual diarrhea, constipation, pain near the anus) TENDERNESS IN MOUTH AND THROAT WITH OR WITHOUT PRESENCE OF ULCERS (sore throat, sores in mouth, or a toothache) UNUSUAL RASH, SWELLING OR PAIN  UNUSUAL VAGINAL DISCHARGE OR ITCHING   Items with * indicate a potential emergency and should be followed up as soon as possible or go to the Emergency Department if any problems should occur.  Please show the CHEMOTHERAPY ALERT CARD or IMMUNOTHERAPY ALERT CARD at  check-in to the Emergency Department and triage nurse.  Should you have questions after your visit or need to cancel or reschedule your appointment, please contact [facility]  Dept: [phone]  and follow the prompts.  Office hours are 8:00 a.m. to 4:30 p.m. Monday - Friday. Please note that voicemails left after 4:00 p.m. may not be returned until the following business day.  We are closed weekends and major holidays. You have access to a nurse at all times for urgent questions. Please call the main number to the clinic Dept: [phone] and follow the prompts.   For any non-urgent questions, you may also contact your provider using MyChart. We now offer e-Visits for anyone 74 and older to request care online for non-urgent symptoms. For details visit [email or web address].   Also download the MyChart app! Go to the app store, search "MyChart", open the app, select [facility], and log in with your MyChart username and password.  Masks are optional in the cancer centers. If you would like for your care team to wear a mask while they are taking care of you, please let them know. For doctor visits, patients may have with them one support person who is at least 86 years old. At this time, visitors are not allowed in the infusion area.

## 2021-11-16 NOTE — Progress Notes (Signed)
Per Dr Julien Nordmann, ok to proceed with treatment today with bladder/incontinence issues.  SInce her surgery/diagnosis, she has been incontinent and does not have an urge to urinate

## 2021-11-17 ENCOUNTER — Telehealth: Payer: Self-pay | Admitting: *Deleted

## 2021-11-17 DIAGNOSIS — N3946 Mixed incontinence: Secondary | ICD-10-CM | POA: Diagnosis not present

## 2021-11-17 NOTE — Telephone Encounter (Signed)
-----   Message from Dionne Ano, RN sent at 11/16/2021 10:53 AM EDT ----- Regarding: Follow up: 1st time Cisplatin/Gemzar Dr. Alen Blew 7/28

## 2021-11-17 NOTE — Telephone Encounter (Signed)
Called pt to see how she did with her recent treatment.  She reports doing well without any side effects.  She didn't have any questions/concerns.  She knows how to reach [place] if needed.  She has an appt soon with Dr Gloriann Loan re:  foul odor to urine.  She knows to drink plenty of fluids.

## 2021-11-21 ENCOUNTER — Other Ambulatory Visit: Payer: Self-pay

## 2021-11-22 ENCOUNTER — Other Ambulatory Visit: Payer: Self-pay

## 2021-11-22 ENCOUNTER — Inpatient Hospital Stay: Payer: Medicare HMO

## 2021-11-22 ENCOUNTER — Inpatient Hospital Stay: Payer: Medicare HMO | Attending: Oncology

## 2021-11-22 VITALS — BP 165/84 | HR 100 | Resp 17 | Wt 148.5 lb

## 2021-11-22 DIAGNOSIS — Z5111 Encounter for antineoplastic chemotherapy: Secondary | ICD-10-CM | POA: Insufficient documentation

## 2021-11-22 DIAGNOSIS — C679 Malignant neoplasm of bladder, unspecified: Secondary | ICD-10-CM | POA: Diagnosis not present

## 2021-11-22 DIAGNOSIS — Z95828 Presence of other vascular implants and grafts: Secondary | ICD-10-CM

## 2021-11-22 LAB — CMP (CANCER CENTER ONLY)
ALT: 19 U/L (ref 0–44)
AST: 21 U/L (ref 15–41)
Albumin: 3.2 g/dL — ABNORMAL LOW (ref 3.5–5.0)
Alkaline Phosphatase: 147 U/L — ABNORMAL HIGH (ref 38–126)
Anion gap: 6 (ref 5–15)
BUN: 20 mg/dL (ref 8–23)
CO2: 30 mmol/L (ref 22–32)
Calcium: 8.2 mg/dL — ABNORMAL LOW (ref 8.9–10.3)
Chloride: 94 mmol/L — ABNORMAL LOW (ref 98–111)
Creatinine: 0.97 mg/dL (ref 0.44–1.00)
GFR, Estimated: 56 mL/min — ABNORMAL LOW (ref >60)
Glucose, Bld: 131 mg/dL — ABNORMAL HIGH (ref 70–99)
Potassium: 3.6 mmol/L (ref 3.5–5.1)
Sodium: 130 mmol/L — ABNORMAL LOW (ref 135–145)
Total Bilirubin: 0.4 mg/dL (ref 0.3–1.2)
Total Protein: 6.2 g/dL — ABNORMAL LOW (ref 6.5–8.1)

## 2021-11-22 LAB — CBC WITH DIFFERENTIAL (CANCER CENTER ONLY)
Abs Immature Granulocytes: 0.01 10*3/uL (ref 0.00–0.07)
Basophils Absolute: 0 10*3/uL (ref 0.0–0.1)
Basophils Relative: 1 %
Eosinophils Absolute: 0.1 10*3/uL (ref 0.0–0.5)
Eosinophils Relative: 1 %
HCT: 32.4 % — ABNORMAL LOW (ref 36.0–46.0)
Hemoglobin: 10.8 g/dL — ABNORMAL LOW (ref 12.0–15.0)
Immature Granulocytes: 0 %
Lymphocytes Relative: 47 %
Lymphs Abs: 2.1 10*3/uL (ref 0.7–4.0)
MCH: 29.2 pg (ref 26.0–34.0)
MCHC: 33.3 g/dL (ref 30.0–36.0)
MCV: 87.6 fL (ref 80.0–100.0)
Monocytes Absolute: 0.3 10*3/uL (ref 0.1–1.0)
Monocytes Relative: 7 %
Neutro Abs: 1.9 10*3/uL (ref 1.7–7.7)
Neutrophils Relative %: 44 %
Platelet Count: 268 10*3/uL (ref 150–400)
RBC: 3.7 MIL/uL — ABNORMAL LOW (ref 3.87–5.11)
RDW: 13.2 % (ref 11.5–15.5)
WBC Count: 4.4 10*3/uL (ref 4.0–10.5)
nRBC: 0 % (ref 0.0–0.2)

## 2021-11-22 MED ORDER — HEPARIN SOD (PORK) LOCK FLUSH 100 UNIT/ML IV SOLN
500.0000 [IU] | Freq: Once | INTRAVENOUS | Status: AC | PRN
  Administered 2021-11-22: 500 [IU]

## 2021-11-22 MED ORDER — SODIUM CHLORIDE 0.9 % IV SOLN
800.0000 mg/m2 | Freq: Once | INTRAVENOUS | Status: AC
  Administered 2021-11-22: 1406 mg via INTRAVENOUS
  Filled 2021-11-22: qty 36.98

## 2021-11-22 MED ORDER — SODIUM CHLORIDE 0.9% FLUSH
10.0000 mL | INTRAVENOUS | Status: DC | PRN
  Administered 2021-11-22: 10 mL

## 2021-11-22 MED ORDER — PROCHLORPERAZINE MALEATE 10 MG PO TABS
10.0000 mg | ORAL_TABLET | Freq: Once | ORAL | Status: AC
  Administered 2021-11-22: 10 mg via ORAL
  Filled 2021-11-22: qty 1

## 2021-11-22 MED ORDER — SODIUM CHLORIDE 0.9% FLUSH
10.0000 mL | Freq: Once | INTRAVENOUS | Status: AC
  Administered 2021-11-22: 10 mL

## 2021-11-22 MED ORDER — ACETAMINOPHEN 325 MG PO TABS
650.0000 mg | ORAL_TABLET | Freq: Once | ORAL | Status: AC
  Administered 2021-11-22: 650 mg via ORAL
  Filled 2021-11-22: qty 2

## 2021-11-22 MED ORDER — SODIUM CHLORIDE 0.9 % IV SOLN: Freq: Once | INTRAVENOUS | Status: AC

## 2021-11-22 NOTE — Progress Notes (Signed)
pt c/o constipation, stating she doesn't remember her last bm but states it was sometime last week. She took a laxative Saturday and did an enema on Sunday with little results. She also states she has not been eating much, only some soft foods. weight today is 148.8 lb.Marland KitchenMarland Kitchen7/27/23 it was 154.8 lb. MD aware. She also states she is on keflex d/t possible UTI. She has been having what she describes as kidney pain and says that this has been ongoing for a few days, she took 2 advil and used an ice pack and she said it helped a little. Ok to proceed with treatment today per Dr Alen Blew.

## 2021-11-22 NOTE — Patient Instructions (Signed)
[facility]   Discharge Instructions: Thank you for choosing [facility] to provide your oncology and hematology care.   If you have a lab appointment with the Cancer Center, please go directly to the Cancer Center and check in at the registration area.   Wear comfortable clothing and clothing appropriate for easy access to any Portacath or PICC line.   We strive to give you quality time with your provider. You may need to reschedule your appointment if you arrive late (15 or more minutes).  Arriving late affects you and other patients whose appointments are after yours.  Also, if you miss three or more appointments without notifying the office, you may be dismissed from the clinic at the provider's discretion.      For prescription refill requests, have your pharmacy contact our office and allow 72 hours for refills to be completed.    Today you received the following chemotherapy and/or immunotherapy agents: gemcitabine      To help prevent nausea and vomiting after your treatment, we encourage you to take your nausea medication as directed.  BELOW ARE SYMPTOMS THAT SHOULD BE REPORTED IMMEDIATELY: *FEVER GREATER THAN 100.4 F (38 C) OR HIGHER *CHILLS OR SWEATING *NAUSEA AND VOMITING THAT IS NOT CONTROLLED WITH YOUR NAUSEA MEDICATION *UNUSUAL SHORTNESS OF BREATH *UNUSUAL BRUISING OR BLEEDING *URINARY PROBLEMS (pain or burning when urinating, or frequent urination) *BOWEL PROBLEMS (unusual diarrhea, constipation, pain near the anus) TENDERNESS IN MOUTH AND THROAT WITH OR WITHOUT PRESENCE OF ULCERS (sore throat, sores in mouth, or a toothache) UNUSUAL RASH, SWELLING OR PAIN  UNUSUAL VAGINAL DISCHARGE OR ITCHING   Items with * indicate a potential emergency and should be followed up as soon as possible or go to the Emergency Department if any problems should occur.  Please show the CHEMOTHERAPY ALERT CARD or IMMUNOTHERAPY ALERT CARD at check-in  to the Emergency Department and triage nurse.  Should you have questions after your visit or need to cancel or reschedule your appointment, please contact [facility]  Dept: [phone]  and follow the prompts.  Office hours are 8:00 a.m. to 4:30 p.m. Monday - Friday. Please note that voicemails left after 4:00 p.m. may not be returned until the following business day.  We are closed weekends and major holidays. You have access to a nurse at all times for urgent questions. Please call the main number to the clinic Dept: [phone] and follow the prompts.   For any non-urgent questions, you may also contact your provider using MyChart. We now offer e-Visits for anyone 18 and older to request care online for non-urgent symptoms. For details visit [email or web address].   Also download the MyChart app! Go to the app store, search "MyChart", open the app, select [facility], and log in with your MyChart username and password.  Masks are optional in the cancer centers. If you would like for your care team to wear a mask while they are taking care of you, please let them know. You may have one support person who is at least 86 years old accompany you for your appointments. 

## 2021-11-28 DIAGNOSIS — K5730 Diverticulosis of large intestine without perforation or abscess without bleeding: Secondary | ICD-10-CM | POA: Diagnosis not present

## 2021-11-28 DIAGNOSIS — N1330 Unspecified hydronephrosis: Secondary | ICD-10-CM | POA: Diagnosis not present

## 2021-11-28 DIAGNOSIS — N130 Hydronephrosis with ureteropelvic junction obstruction: Secondary | ICD-10-CM | POA: Diagnosis not present

## 2021-11-28 DIAGNOSIS — D414 Neoplasm of uncertain behavior of bladder: Secondary | ICD-10-CM | POA: Diagnosis not present

## 2021-11-28 DIAGNOSIS — K449 Diaphragmatic hernia without obstruction or gangrene: Secondary | ICD-10-CM | POA: Diagnosis not present

## 2021-11-28 DIAGNOSIS — K8020 Calculus of gallbladder without cholecystitis without obstruction: Secondary | ICD-10-CM | POA: Diagnosis not present

## 2021-11-30 ENCOUNTER — Other Ambulatory Visit: Payer: Self-pay

## 2021-12-01 ENCOUNTER — Other Ambulatory Visit: Payer: Self-pay | Admitting: Oncology

## 2021-12-01 DIAGNOSIS — C679 Malignant neoplasm of bladder, unspecified: Secondary | ICD-10-CM

## 2021-12-03 ENCOUNTER — Other Ambulatory Visit: Payer: Self-pay | Admitting: Oncology

## 2021-12-06 MED FILL — Dexamethasone Sodium Phosphate Inj 100 MG/10ML: INTRAMUSCULAR | Qty: 1 | Status: AC

## 2021-12-06 MED FILL — Fosaprepitant Dimeglumine For IV Infusion 150 MG (Base Eq): INTRAVENOUS | Qty: 5 | Status: AC

## 2021-12-07 ENCOUNTER — Inpatient Hospital Stay: Payer: Medicare HMO

## 2021-12-07 ENCOUNTER — Other Ambulatory Visit: Payer: Self-pay

## 2021-12-07 ENCOUNTER — Inpatient Hospital Stay (HOSPITAL_BASED_OUTPATIENT_CLINIC_OR_DEPARTMENT_OTHER): Payer: Medicare HMO | Admitting: Oncology

## 2021-12-07 VITALS — BP 161/88 | HR 112 | Temp 97.8°F | Resp 18 | Ht 63.0 in | Wt 144.3 lb

## 2021-12-07 VITALS — HR 100

## 2021-12-07 DIAGNOSIS — C679 Malignant neoplasm of bladder, unspecified: Secondary | ICD-10-CM

## 2021-12-07 DIAGNOSIS — Z5111 Encounter for antineoplastic chemotherapy: Secondary | ICD-10-CM | POA: Diagnosis not present

## 2021-12-07 DIAGNOSIS — Z95828 Presence of other vascular implants and grafts: Secondary | ICD-10-CM

## 2021-12-07 LAB — CMP (CANCER CENTER ONLY)
ALT: 11 U/L (ref 0–44)
AST: 19 U/L (ref 15–41)
Albumin: 3.5 g/dL (ref 3.5–5.0)
Alkaline Phosphatase: 110 U/L (ref 38–126)
Anion gap: 5 (ref 5–15)
BUN: 10 mg/dL (ref 8–23)
CO2: 30 mmol/L (ref 22–32)
Calcium: 8.8 mg/dL — ABNORMAL LOW (ref 8.9–10.3)
Chloride: 100 mmol/L (ref 98–111)
Creatinine: 0.8 mg/dL (ref 0.44–1.00)
GFR, Estimated: >60 mL/min (ref >60)
Glucose, Bld: 133 mg/dL — ABNORMAL HIGH (ref 70–99)
Potassium: 3.7 mmol/L (ref 3.5–5.1)
Sodium: 135 mmol/L (ref 135–145)
Total Bilirubin: 0.3 mg/dL (ref 0.3–1.2)
Total Protein: 6.1 g/dL — ABNORMAL LOW (ref 6.5–8.1)

## 2021-12-07 LAB — CBC WITH DIFFERENTIAL (CANCER CENTER ONLY)
Abs Immature Granulocytes: 0.17 10*3/uL — ABNORMAL HIGH (ref 0.00–0.07)
Basophils Absolute: 0.1 10*3/uL (ref 0.0–0.1)
Basophils Relative: 1 %
Eosinophils Absolute: 0.1 10*3/uL (ref 0.0–0.5)
Eosinophils Relative: 1 %
HCT: 33.4 % — ABNORMAL LOW (ref 36.0–46.0)
Hemoglobin: 11 g/dL — ABNORMAL LOW (ref 12.0–15.0)
Immature Granulocytes: 2 %
Lymphocytes Relative: 39 %
Lymphs Abs: 2.9 10*3/uL (ref 0.7–4.0)
MCH: 29.1 pg (ref 26.0–34.0)
MCHC: 32.9 g/dL (ref 30.0–36.0)
MCV: 88.4 fL (ref 80.0–100.0)
Monocytes Absolute: 0.8 10*3/uL (ref 0.1–1.0)
Monocytes Relative: 11 %
Neutro Abs: 3.4 10*3/uL (ref 1.7–7.7)
Neutrophils Relative %: 46 %
Platelet Count: 589 10*3/uL — ABNORMAL HIGH (ref 150–400)
RBC: 3.78 MIL/uL — ABNORMAL LOW (ref 3.87–5.11)
RDW: 16.1 % — ABNORMAL HIGH (ref 11.5–15.5)
WBC Count: 7.4 10*3/uL (ref 4.0–10.5)
nRBC: 0 % (ref 0.0–0.2)

## 2021-12-07 LAB — MAGNESIUM: Magnesium: 1.9 mg/dL (ref 1.7–2.4)

## 2021-12-07 MED ORDER — SODIUM CHLORIDE 0.9 % IV SOLN: Freq: Once | INTRAVENOUS | Status: AC

## 2021-12-07 MED ORDER — MAGNESIUM SULFATE 2 GM/50ML IV SOLN
2.0000 g | Freq: Once | INTRAVENOUS | Status: AC
  Administered 2021-12-07: 2 g via INTRAVENOUS
  Filled 2021-12-07: qty 50

## 2021-12-07 MED ORDER — SODIUM CHLORIDE 0.9% FLUSH
10.0000 mL | INTRAVENOUS | Status: DC | PRN
  Administered 2021-12-07: 10 mL

## 2021-12-07 MED ORDER — SODIUM CHLORIDE 0.9 % IV SOLN
800.0000 mg/m2 | Freq: Once | INTRAVENOUS | Status: AC
  Administered 2021-12-07: 1368 mg via INTRAVENOUS
  Filled 2021-12-07: qty 9.68

## 2021-12-07 MED ORDER — SODIUM CHLORIDE 0.9 % IV SOLN
150.0000 mg | Freq: Once | INTRAVENOUS | Status: AC
  Administered 2021-12-07: 150 mg via INTRAVENOUS
  Filled 2021-12-07: qty 150

## 2021-12-07 MED ORDER — SODIUM CHLORIDE 0.9% FLUSH
10.0000 mL | Freq: Once | INTRAVENOUS | Status: AC
  Administered 2021-12-07: 10 mL

## 2021-12-07 MED ORDER — HEPARIN SOD (PORK) LOCK FLUSH 100 UNIT/ML IV SOLN
500.0000 [IU] | Freq: Once | INTRAVENOUS | Status: AC | PRN
  Administered 2021-12-07: 500 [IU]

## 2021-12-07 MED ORDER — SODIUM CHLORIDE 0.9 % IV SOLN
50.0000 mg/m2 | Freq: Once | INTRAVENOUS | Status: AC
  Administered 2021-12-07: 87 mg via INTRAVENOUS
  Filled 2021-12-07: qty 87

## 2021-12-07 MED ORDER — POTASSIUM CHLORIDE IN NACL 20-0.9 MEQ/L-% IV SOLN
Freq: Once | INTRAVENOUS | Status: AC
  Filled 2021-12-07: qty 1000

## 2021-12-07 MED ORDER — PALONOSETRON HCL INJECTION 0.25 MG/5ML
0.2500 mg | Freq: Once | INTRAVENOUS | Status: AC
  Administered 2021-12-07: 0.25 mg via INTRAVENOUS
  Filled 2021-12-07: qty 5

## 2021-12-07 MED ORDER — SODIUM CHLORIDE 0.9 % IV SOLN
10.0000 mg | Freq: Once | INTRAVENOUS | Status: AC
  Administered 2021-12-07: 10 mg via INTRAVENOUS
  Filled 2021-12-07: qty 10

## 2021-12-07 NOTE — Progress Notes (Signed)
Hematology and Oncology Follow Up Visit  Brandy Wheeler [id number] 19-Apr-1932 89 y.o. 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  Current therapy: Neoadjuvant chemotherapy utilizing gemcitabine and cisplatin started on November 16, 2021.  She is here for day 1 cycle 2 of 4 planned cycles of therapy.  Interim History: Brandy Wheeler returns today for a follow-up visit.  Since last visit, he completed the first cycle of chemotherapy without any complications.  She denies any nausea, vomiting or abdominal pain.  She denies any fevers or chills.  She does have occasional flank pain takes oxycodone for it.  He was diagnosed with urinary tract infection and was given Keflex.    Medications: I have reviewed the patient's current medications.  Current Outpatient Medications  Medication Sig Dispense Refill   cephALEXin (KEFLEX) 500 MG capsule Take 500 mg by mouth 3 (three) times daily.     Cholecalciferol (VITAMIN D3 PO) Take 1 tablet by mouth in the morning.     Cyanocobalamin (VITAMIN B-12 PO) Take 1 tablet by mouth in the morning.     ibuprofen (ADVIL) 200 MG tablet Take 400 mg by mouth every 6 (six) hours as needed.     lidocaine-prilocaine (EMLA) cream Apply 1 Application topically as needed. 30 g 0   mirabegron ER (MYRBETRIQ) 50 MG TB24 tablet Take 1 tablet (50 mg total) by mouth daily. 30 tablet 0   Misc Natural Products (COLON CLEANSE PO) Take 1 capsule by mouth daily as needed (constipation.). Renew Life Cleanse More     Multiple Minerals-Vitamins (BONE DENSITY BUILDER PO) Take 1 tablet by mouth in the morning. Met[employer]ics Bone Builder Forte     Multiple Vitamin (MULTIVITAMIN WITH MINERALS) TABS tablet  Take 1 tablet by mouth in the morning.     omeprazole (PRILOSEC) 20 MG capsule Take 20 mg by mouth in the morning.     oxyCODONE (OXY IR/ROXICODONE) 5 MG immediate release tablet Take 1 tablet (5 mg total) by mouth every 4 (four) hours as needed for severe pain. (Patient not taking: Reported on 11/03/2021) 20 tablet 0   prochlorperazine (COMPAZINE) 10 MG tablet Take 1 tablet (10 mg total) by mouth every 6 (six) hours as needed for nausea or vomiting. 30 tablet 0   No current facility-administered medications for this visit.     Allergies: No Known Allergies    Physical Exam: Blood pressure (!) 161/88, pulse (!) 112, temperature 97.8 F (36.6 C), temperature source Tympanic, resp. rate 18, height '5\' 3"'$  (1.6 m), weight 144 lb 4.8 oz (65.5 kg), SpO2 100 %.  ECOG: 1   General appearance: Comfortable appearing without any discomfort Head: Normocephalic without any trauma Oropharynx: Mucous membranes are moist and pink without any thrush or ulcers. Eyes: Pupils are equal and round reactive to light. Lymph nodes: No cervical, supraclavicular, inguinal or axillary lymphadenopathy.   Heart:regular rate and rhythm.  S1 and S2 without leg edema. Lung: Clear without any rhonchi or wheezes.  No dullness to percussion. Abdomin: Soft, nontender, nondistended with good bowel sounds.  No hepatosplenomegaly. Musculoskeletal: No joint deformity or effusion.  Full range of motion noted. Neurological: No deficits noted on motor, sensory and deep tendon reflex exam. Skin: No petechial rash or dryness.  Appeared moist.     Lab Results: Lab Results  Component Value Date   WBC 4.4 11/22/2021   HGB 10.8 (L) 11/22/2021   HCT 32.4 (L) 11/22/2021   MCV 87.6 11/22/2021   PLT 268 11/22/2021     Chemistry      Component Value Date/Time   NA 130 (L) 11/22/2021 1222   K 3.6 11/22/2021 1222   CL 94 (L) 11/22/2021 1222   CO2 30 11/22/2021 1222   BUN 20 11/22/2021 1222   CREATININE 0.97 11/22/2021 1222       Component Value Date/Time   CALCIUM 8.2 (L) 11/22/2021 1222   ALKPHOS 147 (H) 11/22/2021 1222   AST 21 11/22/2021 1222   ALT 19 11/22/2021 1222   BILITOT 0.4 11/22/2021 1222         Impression and Plan:   88 year old with:  1.  T3 high-grade urothelial carcinoma of the bladder diagnosed in June 2023.     The natural course of her disease was reviewed at this time and treatment options were discussed.  Risks and benefits of continuing her treatment were reviewed with complications such as myelosuppression, neutropenia and GI toxicity were reiterated.  The plan is to complete 4 cycles of therapy before consideration for cystectomy.  Laboratory data reviewed and continues to show excellent hematological parameters and she is agreeable to proceed.     2.  IV access: Port-A-Cath inserted and currently in use.   3.  Antiemetics: No nausea or vomiting reported at this time.  Compazine is available to her.   4.  Renal function surveillance: Kidney function continues to be within normal range.   5.  Goals of care: Her disease is incurable at this time and aggressive measures are warranted.   6.  Follow-up: In 1 week to complete cycle 2 in 3 weeks for the start of cycle 3.   30  minutes were spent on this encounter.  The time was dedicated to reviewing laboratory data, disease status update and outlining future plan of care discussion.      Zola Button, MD 8/17/20238:12 AM

## 2021-12-07 NOTE — Patient Instructions (Signed)
[facility]  Discharge Instructions: Thank you for choosing [facility] to provide your oncology and hematology care.   If you have a lab appointment with the [facility], please go directly to the [facility] and check in at the registration area.   Wear comfortable clothing and clothing appropriate for easy access to any Portacath or PICC line.   We strive to give you quality time with your provider. You may need to reschedule your appointment if you arrive late (15 or more minutes).  Arriving late affects you and other patients whose appointments are after yours.  Also, if you miss three or more appointments without notifying the office, you may be dismissed from the clinic at the provider's discretion.      For prescription refill requests, have your pharmacy contact our office and allow 72 hours for refills to be completed.    Today you received the following chemotherapy and/or immunotherapy agents: Gemcitabine, Cisplatin.       To help prevent nausea and vomiting after your treatment, we encourage you to take your nausea medication as directed.  BELOW ARE SYMPTOMS THAT SHOULD BE REPORTED IMMEDIATELY: *FEVER GREATER THAN 100.4 F (38 C) OR HIGHER *CHILLS OR SWEATING *NAUSEA AND VOMITING THAT IS NOT CONTROLLED WITH YOUR NAUSEA MEDICATION *UNUSUAL SHORTNESS OF BREATH *UNUSUAL BRUISING OR BLEEDING *URINARY PROBLEMS (pain or burning when urinating, or frequent urination) *BOWEL PROBLEMS (unusual diarrhea, constipation, pain near the anus) TENDERNESS IN MOUTH AND THROAT WITH OR WITHOUT PRESENCE OF ULCERS (sore throat, sores in mouth, or a toothache) UNUSUAL RASH, SWELLING OR PAIN  UNUSUAL VAGINAL DISCHARGE OR ITCHING   Items with * indicate a potential emergency and should be followed up as soon as possible or go to the Emergency Department if any problems should occur.  Please show the CHEMOTHERAPY ALERT CARD or IMMUNOTHERAPY ALERT CARD  at check-in to the Emergency Department and triage nurse.  Should you have questions after your visit or need to cancel or reschedule your appointment, please contact [facility]  Dept: [phone]  and follow the prompts.  Office hours are 8:00 a.m. to 4:30 p.m. Monday - Friday. Please note that voicemails left after 4:00 p.m. may not be returned until the following business day.  We are closed weekends and major holidays. You have access to a nurse at all times for urgent questions. Please call the main number to the clinic Dept: [phone] and follow the prompts.   For any non-urgent questions, you may also contact your provider using MyChart. We now offer e-Visits for anyone 65 and older to request care online for non-urgent symptoms. For details visit [email or web address].   Also download the MyChart app! Go to the app store, search "MyChart", open the app, select [facility], and log in with your MyChart username and password.  Masks are optional in the cancer centers. If you would like for your care team to wear a mask while they are taking care of you, please let them know. You may have one support person who is at least 86 years old accompany you for your appointments.

## 2021-12-11 DIAGNOSIS — N130 Hydronephrosis with ureteropelvic junction obstruction: Secondary | ICD-10-CM | POA: Diagnosis not present

## 2021-12-11 DIAGNOSIS — N3946 Mixed incontinence: Secondary | ICD-10-CM | POA: Diagnosis not present

## 2021-12-13 ENCOUNTER — Other Ambulatory Visit: Payer: Self-pay

## 2021-12-14 ENCOUNTER — Other Ambulatory Visit: Payer: Self-pay

## 2021-12-14 ENCOUNTER — Inpatient Hospital Stay: Payer: Medicare HMO

## 2021-12-14 VITALS — BP 170/76 | HR 89 | Temp 98.0°F | Resp 16 | Wt 146.4 lb

## 2021-12-14 DIAGNOSIS — Z95828 Presence of other vascular implants and grafts: Secondary | ICD-10-CM

## 2021-12-14 DIAGNOSIS — C679 Malignant neoplasm of bladder, unspecified: Secondary | ICD-10-CM

## 2021-12-14 DIAGNOSIS — Z5111 Encounter for antineoplastic chemotherapy: Secondary | ICD-10-CM | POA: Diagnosis not present

## 2021-12-14 LAB — CBC WITH DIFFERENTIAL (CANCER CENTER ONLY)
Abs Immature Granulocytes: 0.12 10*3/uL — ABNORMAL HIGH (ref 0.00–0.07)
Basophils Absolute: 0.1 10*3/uL (ref 0.0–0.1)
Basophils Relative: 1 %
Eosinophils Absolute: 0.1 10*3/uL (ref 0.0–0.5)
Eosinophils Relative: 1 %
HCT: 31.8 % — ABNORMAL LOW (ref 36.0–46.0)
Hemoglobin: 10.7 g/dL — ABNORMAL LOW (ref 12.0–15.0)
Immature Granulocytes: 2 %
Lymphocytes Relative: 43 %
Lymphs Abs: 3.4 10*3/uL (ref 0.7–4.0)
MCH: 29.5 pg (ref 26.0–34.0)
MCHC: 33.6 g/dL (ref 30.0–36.0)
MCV: 87.6 fL (ref 80.0–100.0)
Monocytes Absolute: 0.7 10*3/uL (ref 0.1–1.0)
Monocytes Relative: 9 %
Neutro Abs: 3.4 10*3/uL (ref 1.7–7.7)
Neutrophils Relative %: 44 %
Platelet Count: 321 10*3/uL (ref 150–400)
RBC: 3.63 MIL/uL — ABNORMAL LOW (ref 3.87–5.11)
RDW: 15.9 % — ABNORMAL HIGH (ref 11.5–15.5)
WBC Count: 7.6 10*3/uL (ref 4.0–10.5)
nRBC: 0 % (ref 0.0–0.2)

## 2021-12-14 LAB — CMP (CANCER CENTER ONLY)
ALT: 13 U/L (ref 0–44)
AST: 19 U/L (ref 15–41)
Albumin: 3.6 g/dL (ref 3.5–5.0)
Alkaline Phosphatase: 104 U/L (ref 38–126)
Anion gap: 4 — ABNORMAL LOW (ref 5–15)
BUN: 11 mg/dL (ref 8–23)
CO2: 32 mmol/L (ref 22–32)
Calcium: 8.8 mg/dL — ABNORMAL LOW (ref 8.9–10.3)
Chloride: 97 mmol/L — ABNORMAL LOW (ref 98–111)
Creatinine: 0.59 mg/dL (ref 0.44–1.00)
GFR, Estimated: >60 mL/min (ref >60)
Glucose, Bld: 134 mg/dL — ABNORMAL HIGH (ref 70–99)
Potassium: 3.6 mmol/L (ref 3.5–5.1)
Sodium: 133 mmol/L — ABNORMAL LOW (ref 135–145)
Total Bilirubin: 0.3 mg/dL (ref 0.3–1.2)
Total Protein: 6.1 g/dL — ABNORMAL LOW (ref 6.5–8.1)

## 2021-12-14 LAB — MAGNESIUM: Magnesium: 1.8 mg/dL (ref 1.7–2.4)

## 2021-12-14 MED ORDER — SODIUM CHLORIDE 0.9 % IV SOLN: Freq: Once | INTRAVENOUS | Status: AC

## 2021-12-14 MED ORDER — HEPARIN SOD (PORK) LOCK FLUSH 100 UNIT/ML IV SOLN
500.0000 [IU] | Freq: Once | INTRAVENOUS | Status: AC | PRN
  Administered 2021-12-14: 500 [IU]

## 2021-12-14 MED ORDER — SODIUM CHLORIDE 0.9% FLUSH
10.0000 mL | INTRAVENOUS | Status: DC | PRN
  Administered 2021-12-14: 10 mL

## 2021-12-14 MED ORDER — SODIUM CHLORIDE 0.9 % IV SOLN
800.0000 mg/m2 | Freq: Once | INTRAVENOUS | Status: AC
  Administered 2021-12-14: 1368 mg via INTRAVENOUS
  Filled 2021-12-14: qty 35.98

## 2021-12-14 MED ORDER — PROCHLORPERAZINE MALEATE 10 MG PO TABS
10.0000 mg | ORAL_TABLET | Freq: Once | ORAL | Status: AC
  Administered 2021-12-14: 10 mg via ORAL
  Filled 2021-12-14: qty 1

## 2021-12-14 MED ORDER — SODIUM CHLORIDE 0.9% FLUSH
10.0000 mL | Freq: Once | INTRAVENOUS | Status: AC
  Administered 2021-12-14: 10 mL

## 2021-12-14 NOTE — Patient Instructions (Signed)
[facility]   Discharge Instructions: Thank you for choosing [facility] to provide your oncology and hematology care.   If you have a lab appointment with the Cancer Center, please go directly to the Cancer Center and check in at the registration area.   Wear comfortable clothing and clothing appropriate for easy access to any Portacath or PICC line.   We strive to give you quality time with your provider. You may need to reschedule your appointment if you arrive late (15 or more minutes).  Arriving late affects you and other patients whose appointments are after yours.  Also, if you miss three or more appointments without notifying the office, you may be dismissed from the clinic at the provider's discretion.      For prescription refill requests, have your pharmacy contact our office and allow 72 hours for refills to be completed.    Today you received the following chemotherapy and/or immunotherapy agents: gemcitabine      To help prevent nausea and vomiting after your treatment, we encourage you to take your nausea medication as directed.  BELOW ARE SYMPTOMS THAT SHOULD BE REPORTED IMMEDIATELY: *FEVER GREATER THAN 100.4 F (38 C) OR HIGHER *CHILLS OR SWEATING *NAUSEA AND VOMITING THAT IS NOT CONTROLLED WITH YOUR NAUSEA MEDICATION *UNUSUAL SHORTNESS OF BREATH *UNUSUAL BRUISING OR BLEEDING *URINARY PROBLEMS (pain or burning when urinating, or frequent urination) *BOWEL PROBLEMS (unusual diarrhea, constipation, pain near the anus) TENDERNESS IN MOUTH AND THROAT WITH OR WITHOUT PRESENCE OF ULCERS (sore throat, sores in mouth, or a toothache) UNUSUAL RASH, SWELLING OR PAIN  UNUSUAL VAGINAL DISCHARGE OR ITCHING   Items with * indicate a potential emergency and should be followed up as soon as possible or go to the Emergency Department if any problems should occur.  Please show the CHEMOTHERAPY ALERT CARD or IMMUNOTHERAPY ALERT CARD at check-in  to the Emergency Department and triage nurse.  Should you have questions after your visit or need to cancel or reschedule your appointment, please contact [facility]  Dept: [phone]  and follow the prompts.  Office hours are 8:00 a.m. to 4:30 p.m. Monday - Friday. Please note that voicemails left after 4:00 p.m. may not be returned until the following business day.  We are closed weekends and major holidays. You have access to a nurse at all times for urgent questions. Please call the main number to the clinic Dept: [phone] and follow the prompts.   For any non-urgent questions, you may also contact your provider using MyChart. We now offer e-Visits for anyone 18 and older to request care online for non-urgent symptoms. For details visit [email or web address].   Also download the MyChart app! Go to the app store, search "MyChart", open the app, select [facility], and log in with your MyChart username and password.  Masks are optional in the cancer centers. If you would like for your care team to wear a mask while they are taking care of you, please let them know. You may have one support person who is at least 86 years old accompany you for your appointments. 

## 2021-12-22 ENCOUNTER — Other Ambulatory Visit: Payer: Self-pay

## 2021-12-26 ENCOUNTER — Other Ambulatory Visit: Payer: Self-pay

## 2021-12-27 MED FILL — Fosaprepitant Dimeglumine For IV Infusion 150 MG (Base Eq): INTRAVENOUS | Qty: 5 | Status: AC

## 2021-12-27 MED FILL — Dexamethasone Sodium Phosphate Inj 100 MG/10ML: INTRAMUSCULAR | Qty: 1 | Status: AC

## 2021-12-28 ENCOUNTER — Inpatient Hospital Stay: Payer: Medicare HMO

## 2021-12-28 ENCOUNTER — Other Ambulatory Visit: Payer: Self-pay

## 2021-12-28 ENCOUNTER — Inpatient Hospital Stay: Payer: Medicare HMO | Admitting: Nutrition

## 2021-12-28 ENCOUNTER — Inpatient Hospital Stay: Payer: Medicare HMO | Attending: Oncology | Admitting: Oncology

## 2021-12-28 VITALS — BP 187/94 | HR 115 | Temp 97.6°F | Resp 16 | Ht 63.0 in | Wt 148.5 lb

## 2021-12-28 VITALS — BP 158/68 | HR 97

## 2021-12-28 DIAGNOSIS — Z5111 Encounter for antineoplastic chemotherapy: Secondary | ICD-10-CM | POA: Insufficient documentation

## 2021-12-28 DIAGNOSIS — Z95828 Presence of other vascular implants and grafts: Secondary | ICD-10-CM

## 2021-12-28 DIAGNOSIS — C679 Malignant neoplasm of bladder, unspecified: Secondary | ICD-10-CM | POA: Diagnosis not present

## 2021-12-28 LAB — CBC WITH DIFFERENTIAL (CANCER CENTER ONLY)
Abs Immature Granulocytes: 0.05 10*3/uL (ref 0.00–0.07)
Basophils Absolute: 0 10*3/uL (ref 0.0–0.1)
Basophils Relative: 1 %
Eosinophils Absolute: 0.1 10*3/uL (ref 0.0–0.5)
Eosinophils Relative: 1 %
HCT: 31.6 % — ABNORMAL LOW (ref 36.0–46.0)
Hemoglobin: 10.6 g/dL — ABNORMAL LOW (ref 12.0–15.0)
Immature Granulocytes: 1 %
Lymphocytes Relative: 44 %
Lymphs Abs: 2.4 10*3/uL (ref 0.7–4.0)
MCH: 30.5 pg (ref 26.0–34.0)
MCHC: 33.5 g/dL (ref 30.0–36.0)
MCV: 90.8 fL (ref 80.0–100.0)
Monocytes Absolute: 0.6 10*3/uL (ref 0.1–1.0)
Monocytes Relative: 11 %
Neutro Abs: 2.3 10*3/uL (ref 1.7–7.7)
Neutrophils Relative %: 42 %
Platelet Count: 293 10*3/uL (ref 150–400)
RBC: 3.48 MIL/uL — ABNORMAL LOW (ref 3.87–5.11)
RDW: 19.6 % — ABNORMAL HIGH (ref 11.5–15.5)
WBC Count: 5.5 10*3/uL (ref 4.0–10.5)
nRBC: 0 % (ref 0.0–0.2)

## 2021-12-28 LAB — CMP (CANCER CENTER ONLY)
ALT: 9 U/L (ref 0–44)
AST: 16 U/L (ref 15–41)
Albumin: 3.7 g/dL (ref 3.5–5.0)
Alkaline Phosphatase: 77 U/L (ref 38–126)
Anion gap: 4 — ABNORMAL LOW (ref 5–15)
BUN: 18 mg/dL (ref 8–23)
CO2: 31 mmol/L (ref 22–32)
Calcium: 9 mg/dL (ref 8.9–10.3)
Chloride: 101 mmol/L (ref 98–111)
Creatinine: 0.74 mg/dL (ref 0.44–1.00)
GFR, Estimated: >60 mL/min (ref >60)
Glucose, Bld: 126 mg/dL — ABNORMAL HIGH (ref 70–99)
Potassium: 3.6 mmol/L (ref 3.5–5.1)
Sodium: 136 mmol/L (ref 135–145)
Total Bilirubin: 0.3 mg/dL (ref 0.3–1.2)
Total Protein: 6.2 g/dL — ABNORMAL LOW (ref 6.5–8.1)

## 2021-12-28 MED ORDER — MAGNESIUM SULFATE 2 GM/50ML IV SOLN
2.0000 g | Freq: Once | INTRAVENOUS | Status: AC
  Administered 2021-12-28: 2 g via INTRAVENOUS
  Filled 2021-12-28: qty 50

## 2021-12-28 MED ORDER — PALONOSETRON HCL INJECTION 0.25 MG/5ML
0.2500 mg | Freq: Once | INTRAVENOUS | Status: AC
  Administered 2021-12-28: 0.25 mg via INTRAVENOUS
  Filled 2021-12-28: qty 5

## 2021-12-28 MED ORDER — HEPARIN SOD (PORK) LOCK FLUSH 100 UNIT/ML IV SOLN
500.0000 [IU] | Freq: Once | INTRAVENOUS | Status: AC | PRN
  Administered 2021-12-28: 500 [IU]

## 2021-12-28 MED ORDER — SODIUM CHLORIDE 0.9 % IV SOLN
50.0000 mg/m2 | Freq: Once | INTRAVENOUS | Status: AC
  Administered 2021-12-28: 87 mg via INTRAVENOUS
  Filled 2021-12-28: qty 87

## 2021-12-28 MED ORDER — SODIUM CHLORIDE 0.9 % IV SOLN
10.0000 mg | Freq: Once | INTRAVENOUS | Status: AC
  Administered 2021-12-28: 10 mg via INTRAVENOUS
  Filled 2021-12-28: qty 10

## 2021-12-28 MED ORDER — SODIUM CHLORIDE 0.9 % IV SOLN
150.0000 mg | Freq: Once | INTRAVENOUS | Status: AC
  Administered 2021-12-28: 150 mg via INTRAVENOUS
  Filled 2021-12-28: qty 150

## 2021-12-28 MED ORDER — SODIUM CHLORIDE 0.9 % IV SOLN
800.0000 mg/m2 | Freq: Once | INTRAVENOUS | Status: AC
  Administered 2021-12-28: 1368 mg via INTRAVENOUS
  Filled 2021-12-28: qty 35.98

## 2021-12-28 MED ORDER — SODIUM CHLORIDE 0.9% FLUSH
10.0000 mL | INTRAVENOUS | Status: DC | PRN
  Administered 2021-12-28: 10 mL

## 2021-12-28 MED ORDER — SODIUM CHLORIDE 0.9% FLUSH
10.0000 mL | Freq: Once | INTRAVENOUS | Status: AC
  Administered 2021-12-28: 10 mL

## 2021-12-28 MED ORDER — SODIUM CHLORIDE 0.9 % IV SOLN: Freq: Once | INTRAVENOUS | Status: AC

## 2021-12-28 MED ORDER — POTASSIUM CHLORIDE IN NACL 20-0.9 MEQ/L-% IV SOLN
Freq: Once | INTRAVENOUS | Status: AC
  Filled 2021-12-28: qty 1000

## 2021-12-28 NOTE — Progress Notes (Signed)
Hematology and Oncology Follow Up Visit  Brandy Wheeler [id number] 1931/08/16 88 y.o. 12/28/2021 8:24 AM Pcp, Brandy Sauer, MD   Principle Diagnosis: 88 year old woman with bladder cancer diagnosed in June 2023.  She was found to have T3 N0 high-grade urothelial carcinoma.   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  Current therapy: Neoadjuvant chemotherapy utilizing gemcitabine and cisplatin started on November 16, 2021.  She is here for day 1 cycle 3 of 4 planned cycles of therapy.  Interim History: Brandy Wheeler presents today for repeat follow-up.  Since last visit, she reports no major changes in her health.  She continues to tolerate chemotherapy without any complications.  Denies any nausea, vomiting.  Abdominal pain.  She denies any recent hospitalizations or illnesses.  She denies any worsening neuropathy or fatigue.    Medications: Updated on review. Current Outpatient Medications  Medication Sig Dispense Refill   cephALEXin (KEFLEX) 500 MG capsule Take 500 mg by mouth 3 (three) times daily.     Cholecalciferol (VITAMIN D3 PO) Take 1 tablet by mouth in the morning.     Cyanocobalamin (VITAMIN B-12 PO) Take 1 tablet by mouth in the morning.     ibuprofen (ADVIL) 200 MG tablet Take 400 mg by mouth every 6 (six) hours as needed.     lidocaine-prilocaine (EMLA) cream Apply 1 Application topically as needed. 30 g 0   mirabegron ER (MYRBETRIQ) 50 MG TB24 tablet Take 1 tablet (50 mg total) by mouth daily. 30 tablet 0   Misc Natural Products (COLON CLEANSE PO) Take 1 capsule by mouth daily as needed (constipation.). Renew Life Cleanse More     Multiple Minerals-Vitamins (BONE DENSITY BUILDER PO) Take 1 tablet by mouth in the morning. [employer] Bone Builder Forte     Multiple Vitamin (MULTIVITAMIN WITH MINERALS) TABS tablet Take 1 tablet by mouth in the morning.      omeprazole (PRILOSEC) 20 MG capsule Take 20 mg by mouth in the morning.     oxyCODONE (OXY IR/ROXICODONE) 5 MG immediate release tablet Take 1 tablet (5 mg total) by mouth every 4 (four) hours as needed for severe pain. (Patient not taking: Reported on 11/03/2021) 20 tablet 0   prochlorperazine (COMPAZINE) 10 MG tablet Take 1 tablet (10 mg total) by mouth every 6 (six) hours as needed for nausea or vomiting. 30 tablet 0   No current facility-administered medications for this visit.     Allergies: No Known Allergies    Physical Exam: Blood pressure (!) 187/94, pulse (!) 115, temperature 97.6 F (36.4 C), temperature source Temporal, resp. rate 16, height '5\' 3"'$  (1.6 m), weight 148 lb 8 oz (67.4 kg), SpO2 97 %.   ECOG: 1    General appearance: Alert, awake without any distress. Head: Atraumatic without abnormalities Oropharynx: Without any thrush or ulcers. Eyes: No scleral icterus. Lymph nodes: No lymphadenopathy noted in the cervical, supraclavicular, or axillary nodes Heart:regular rate and rhythm, without any murmurs or gallops.   Lung: Clear to auscultation without any rhonchi, wheezes or dullness to percussion. Abdomin: Soft, nontender without any shifting dullness or ascites. Musculoskeletal: No clubbing or cyanosis. Neurological: No motor or sensory deficits. Skin: No rashes or lesions.     Lab Results: Lab Results  Component Value Date   WBC 7.6 12/14/2021   HGB 10.7 (L) 12/14/2021   HCT 31.8 (L) 12/14/2021   MCV 87.6 12/14/2021  PLT 321 12/14/2021     Chemistry      Component Value Date/Time   NA 133 (L) 12/14/2021 1250   K 3.6 12/14/2021 1250   CL 97 (L) 12/14/2021 1250   CO2 32 12/14/2021 1250   BUN 11 12/14/2021 1250   CREATININE 0.59 12/14/2021 1250      Component Value Date/Time   CALCIUM 8.8 (L) 12/14/2021 1250   ALKPHOS 104 12/14/2021 1250   AST 19 12/14/2021 1250   ALT 13 12/14/2021 1250   BILITOT 0.3 12/14/2021 1250          Impression and Plan:   88 year old with:  1.  Bladder cancer diagnosed in June 2023.  She was found to have T3 high-grade urothelial carcinoma    She is currently receiving neoadjuvant chemotherapy without any major complications.  Risks and benefits of continuing 4 cycles of treatment were discussed.  These complications that include nausea, vomiting, myelosuppression, neutropenia and possible sepsis.  Renal failure and electrolyte imbalance is also a consideration.  She is agreeable to proceed at this time.    2.  IV access: No complications noted to her Port-A-Cath.  This will continue to be in use.   3.  Antiemetics: Compazine is available to her without any nausea or vomiting.   4.  Renal function surveillance: Creatinine clearance continues to be normal on platinum based therapy.   5.  Goals of care: He remains curative and aggressive measures are warranted.   6.  Follow-up: She will return in 1 week to complete current cycle and in 3 weeks for the start of cycle 4.   30  minutes were dedicated to this visit.  The time was spent on reviewing laboratory data, disease status update and outlining future plan of care discussion.      Zola Button, MD 9/7/20238:24 AM

## 2021-12-28 NOTE — Patient Instructions (Signed)
[facility]  Discharge Instructions: Thank you for choosing [facility] to provide your oncology and hematology care.   If you have a lab appointment with the [facility], please go directly to the [facility] and check in at the registration area.   Wear comfortable clothing and clothing appropriate for easy access to any Portacath or PICC line.   We strive to give you quality time with your provider. You may need to reschedule your appointment if you arrive late (15 or more minutes).  Arriving late affects you and other patients whose appointments are after yours.  Also, if you miss three or more appointments without notifying the office, you may be dismissed from the clinic at the provider's discretion.      For prescription refill requests, have your pharmacy contact our office and allow 72 hours for refills to be completed.    Today you received the following chemotherapy and/or immunotherapy agents: Gemcitabine, Cisplatin.       To help prevent nausea and vomiting after your treatment, we encourage you to take your nausea medication as directed.  BELOW ARE SYMPTOMS THAT SHOULD BE REPORTED IMMEDIATELY: *FEVER GREATER THAN 100.4 F (38 C) OR HIGHER *CHILLS OR SWEATING *NAUSEA AND VOMITING THAT IS NOT CONTROLLED WITH YOUR NAUSEA MEDICATION *UNUSUAL SHORTNESS OF BREATH *UNUSUAL BRUISING OR BLEEDING *URINARY PROBLEMS (pain or burning when urinating, or frequent urination) *BOWEL PROBLEMS (unusual diarrhea, constipation, pain near the anus) TENDERNESS IN MOUTH AND THROAT WITH OR WITHOUT PRESENCE OF ULCERS (sore throat, sores in mouth, or a toothache) UNUSUAL RASH, SWELLING OR PAIN  UNUSUAL VAGINAL DISCHARGE OR ITCHING   Items with * indicate a potential emergency and should be followed up as soon as possible or go to the Emergency Department if any problems should occur.  Please show the CHEMOTHERAPY ALERT CARD or IMMUNOTHERAPY ALERT CARD  at check-in to the Emergency Department and triage nurse.  Should you have questions after your visit or need to cancel or reschedule your appointment, please contact [facility]  Dept: [phone]  and follow the prompts.  Office hours are 8:00 a.m. to 4:30 p.m. Monday - Friday. Please note that voicemails left after 4:00 p.m. may not be returned until the following business day.  We are closed weekends and major holidays. You have access to a nurse at all times for urgent questions. Please call the main number to the clinic Dept: [phone] and follow the prompts.   For any non-urgent questions, you may also contact your provider using MyChart. We now offer e-Visits for anyone 25 and older to request care online for non-urgent symptoms. For details visit [email or web address].   Also download the MyChart app! Go to the app store, search "MyChart", open the app, select [facility], and log in with your MyChart username and password.  Masks are optional in the cancer centers. If you would like for your care team to wear a mask while they are taking care of you, please let them know. You may have one support person who is at least 86 years old accompany you for your appointments.

## 2021-12-28 NOTE — Progress Notes (Signed)
88 year old female diagnosed with bladder cancer in June 2023.  She is followed by Dr. Alen Blew.  She is receiving cisplatin and gemcitabine.  Past medical history includes diverticulitis.  Medications include vitamin D 3, B 12, multivitamin, Compazine.  Labs include glucose 126.  Height: 5 feet 3 inches. Weight: 148.5 pounds on September 7. Usual body weight: Approximately 160 pounds. BMI: 26.31.  Patient reports she is eating better.  She denies nutrition impact symptoms.  She has good support from her daughters.  She states she does try to eat adequate protein but does not care much for meat.  She does enjoy beans and has been trying to include these more often.  Nutrition diagnosis: Unintended weight loss related to cancer and associated treatments as evidenced by no prior need for nutrition related information.  Intervention: Educated patient on the importance of smaller more frequent meals and snacks with higher calorie, higher protein foods. Encouraged weight maintenance. Provided nutrition facts sheets.  Also gave patient contact information for further questions.  Monitoring, evaluation, goals: Patient will tolerate increased calories and protein to minimize weight loss.  We will follow as necessary.  Please consult RD if nutrition issues are identified.  **Disclaimer: This note was dictated with voice recognition software. Similar sounding words can inadvertently be transcribed and this note may contain transcription errors which may not have been corrected upon publication of note.**

## 2022-01-04 ENCOUNTER — Inpatient Hospital Stay: Payer: Medicare HMO

## 2022-01-04 VITALS — BP 159/86 | HR 95 | Temp 98.2°F | Resp 17

## 2022-01-04 DIAGNOSIS — C679 Malignant neoplasm of bladder, unspecified: Secondary | ICD-10-CM

## 2022-01-04 DIAGNOSIS — Z95828 Presence of other vascular implants and grafts: Secondary | ICD-10-CM

## 2022-01-04 DIAGNOSIS — Z5111 Encounter for antineoplastic chemotherapy: Secondary | ICD-10-CM | POA: Diagnosis not present

## 2022-01-04 LAB — CMP (CANCER CENTER ONLY)
ALT: 12 U/L (ref 0–44)
AST: 18 U/L (ref 15–41)
Albumin: 3.7 g/dL (ref 3.5–5.0)
Alkaline Phosphatase: 82 U/L (ref 38–126)
Anion gap: 5 (ref 5–15)
BUN: 15 mg/dL (ref 8–23)
CO2: 32 mmol/L (ref 22–32)
Calcium: 9.1 mg/dL (ref 8.9–10.3)
Chloride: 98 mmol/L (ref 98–111)
Creatinine: 0.77 mg/dL (ref 0.44–1.00)
GFR, Estimated: >60 mL/min (ref >60)
Glucose, Bld: 170 mg/dL — ABNORMAL HIGH (ref 70–99)
Potassium: 3.4 mmol/L — ABNORMAL LOW (ref 3.5–5.1)
Sodium: 135 mmol/L (ref 135–145)
Total Bilirubin: 0.2 mg/dL — ABNORMAL LOW (ref 0.3–1.2)
Total Protein: 6.1 g/dL — ABNORMAL LOW (ref 6.5–8.1)

## 2022-01-04 LAB — CBC WITH DIFFERENTIAL (CANCER CENTER ONLY)
Abs Immature Granulocytes: 0.05 10*3/uL (ref 0.00–0.07)
Basophils Absolute: 0.1 10*3/uL (ref 0.0–0.1)
Basophils Relative: 1 %
Eosinophils Absolute: 0 10*3/uL (ref 0.0–0.5)
Eosinophils Relative: 0 %
HCT: 31 % — ABNORMAL LOW (ref 36.0–46.0)
Hemoglobin: 10 g/dL — ABNORMAL LOW (ref 12.0–15.0)
Immature Granulocytes: 1 %
Lymphocytes Relative: 31 %
Lymphs Abs: 2.5 10*3/uL (ref 0.7–4.0)
MCH: 30.2 pg (ref 26.0–34.0)
MCHC: 32.3 g/dL (ref 30.0–36.0)
MCV: 93.7 fL (ref 80.0–100.0)
Monocytes Absolute: 0.8 10*3/uL (ref 0.1–1.0)
Monocytes Relative: 9 %
Neutro Abs: 4.6 10*3/uL (ref 1.7–7.7)
Neutrophils Relative %: 58 %
Platelet Count: 221 10*3/uL (ref 150–400)
RBC: 3.31 MIL/uL — ABNORMAL LOW (ref 3.87–5.11)
RDW: 18.6 % — ABNORMAL HIGH (ref 11.5–15.5)
WBC Count: 8 10*3/uL (ref 4.0–10.5)
nRBC: 0 % (ref 0.0–0.2)

## 2022-01-04 MED ORDER — SODIUM CHLORIDE 0.9% FLUSH
10.0000 mL | Freq: Once | INTRAVENOUS | Status: AC
  Administered 2022-01-04: 10 mL

## 2022-01-04 MED ORDER — SODIUM CHLORIDE 0.9 % IV SOLN
800.0000 mg/m2 | Freq: Once | INTRAVENOUS | Status: AC
  Administered 2022-01-04: 1368 mg via INTRAVENOUS
  Filled 2022-01-04: qty 35.98

## 2022-01-04 MED ORDER — HEPARIN SOD (PORK) LOCK FLUSH 100 UNIT/ML IV SOLN
500.0000 [IU] | Freq: Once | INTRAVENOUS | Status: AC | PRN
  Administered 2022-01-04: 500 [IU]

## 2022-01-04 MED ORDER — SODIUM CHLORIDE 0.9 % IV SOLN: Freq: Once | INTRAVENOUS | Status: AC

## 2022-01-04 MED ORDER — PROCHLORPERAZINE MALEATE 10 MG PO TABS
10.0000 mg | ORAL_TABLET | Freq: Once | ORAL | Status: AC
  Administered 2022-01-04: 10 mg via ORAL
  Filled 2022-01-04: qty 1

## 2022-01-04 MED ORDER — SODIUM CHLORIDE 0.9% FLUSH
10.0000 mL | INTRAVENOUS | Status: DC | PRN
  Administered 2022-01-04: 10 mL

## 2022-01-04 NOTE — Patient Instructions (Signed)
[facility]  Discharge Instructions: Thank you for choosing [facility] to provide your oncology and hematology care.   If you have a lab appointment with the Cancer Center, please go directly to the Cancer Center and check in at the registration area.   Wear comfortable clothing and clothing appropriate for easy access to any Portacath or PICC line.   We strive to give you quality time with your provider. You may need to reschedule your appointment if you arrive late (15 or more minutes).  Arriving late affects you and other patients whose appointments are after yours.  Also, if you miss three or more appointments without notifying the office, you may be dismissed from the clinic at the provider's discretion.      For prescription refill requests, have your pharmacy contact our office and allow 72 hours for refills to be completed.    Today you received the following chemotherapy and/or immunotherapy agents: Gemzar      To help prevent nausea and vomiting after your treatment, we encourage you to take your nausea medication as directed.  BELOW ARE SYMPTOMS THAT SHOULD BE REPORTED IMMEDIATELY: *FEVER GREATER THAN 100.4 F (38 C) OR HIGHER *CHILLS OR SWEATING *NAUSEA AND VOMITING THAT IS NOT CONTROLLED WITH YOUR NAUSEA MEDICATION *UNUSUAL SHORTNESS OF BREATH *UNUSUAL BRUISING OR BLEEDING *URINARY PROBLEMS (pain or burning when urinating, or frequent urination) *BOWEL PROBLEMS (unusual diarrhea, constipation, pain near the anus) TENDERNESS IN MOUTH AND THROAT WITH OR WITHOUT PRESENCE OF ULCERS (sore throat, sores in mouth, or a toothache) UNUSUAL RASH, SWELLING OR PAIN  UNUSUAL VAGINAL DISCHARGE OR ITCHING   Items with * indicate a potential emergency and should be followed up as soon as possible or go to the Emergency Department if any problems should occur.  Please show the CHEMOTHERAPY ALERT CARD or IMMUNOTHERAPY ALERT CARD at check-in to the  Emergency Department and triage nurse.  Should you have questions after your visit or need to cancel or reschedule your appointment, please contact [facility]  Dept: [phone]  and follow the prompts.  Office hours are 8:00 a.m. to 4:30 p.m. Monday - Friday. Please note that voicemails left after 4:00 p.m. may not be returned until the following business day.  We are closed weekends and major holidays. You have access to a nurse at all times for urgent questions. Please call the main number to the clinic Dept: [phone] and follow the prompts.   For any non-urgent questions, you may also contact your provider using MyChart. We now offer e-Visits for anyone 18 and older to request care online for non-urgent symptoms. For details visit [email or web address].   Also download the MyChart app! Go to the app store, search "MyChart", open the app, select [facility], and log in with your MyChart username and password.  Masks are optional in the cancer centers. If you would like for your care team to wear a mask while they are taking care of you, please let them know. You may have one support person who is at least 86 years old accompany you for your appointments. 

## 2022-01-11 DIAGNOSIS — R030 Elevated blood-pressure reading, without diagnosis of hypertension: Secondary | ICD-10-CM | POA: Diagnosis not present

## 2022-01-11 DIAGNOSIS — R059 Cough, unspecified: Secondary | ICD-10-CM | POA: Diagnosis not present

## 2022-01-11 DIAGNOSIS — U071 COVID-19: Secondary | ICD-10-CM | POA: Diagnosis not present

## 2022-01-15 ENCOUNTER — Telehealth: Payer: Self-pay | Admitting: *Deleted

## 2022-01-15 NOTE — Telephone Encounter (Signed)
Notified of message below. Message to scheduler to move appts

## 2022-01-15 NOTE — Telephone Encounter (Signed)
Ms Aden tested positive for Covid on Thursday, 9/21. Symptoms are sinus/cough, is taking tessalon and Lageviro. She is not feeling good today. Wants to know what to do about chemo this week. Is scheduled for Carbo/gemzar on Thursday.

## 2022-01-16 ENCOUNTER — Other Ambulatory Visit: Payer: Self-pay

## 2022-01-18 ENCOUNTER — Ambulatory Visit: Payer: Medicare HMO

## 2022-01-18 ENCOUNTER — Ambulatory Visit: Payer: Medicare HMO | Admitting: Oncology

## 2022-01-18 ENCOUNTER — Other Ambulatory Visit: Payer: Medicare HMO

## 2022-01-20 ENCOUNTER — Other Ambulatory Visit: Payer: Self-pay

## 2022-01-23 DIAGNOSIS — D414 Neoplasm of uncertain behavior of bladder: Secondary | ICD-10-CM | POA: Diagnosis not present

## 2022-01-23 DIAGNOSIS — N3946 Mixed incontinence: Secondary | ICD-10-CM | POA: Diagnosis not present

## 2022-01-25 ENCOUNTER — Telehealth: Payer: Self-pay | Admitting: Oncology

## 2022-01-25 ENCOUNTER — Inpatient Hospital Stay: Payer: Medicare HMO | Admitting: Oncology

## 2022-01-25 ENCOUNTER — Inpatient Hospital Stay: Payer: Medicare HMO

## 2022-01-25 NOTE — Telephone Encounter (Signed)
Called patient regarding 10/12 appointments, scheduled per nurse and provider orders. Patient is notified.

## 2022-01-26 ENCOUNTER — Other Ambulatory Visit: Payer: Self-pay

## 2022-01-31 DIAGNOSIS — H524 Presbyopia: Secondary | ICD-10-CM | POA: Diagnosis not present

## 2022-02-01 ENCOUNTER — Inpatient Hospital Stay (HOSPITAL_BASED_OUTPATIENT_CLINIC_OR_DEPARTMENT_OTHER): Payer: Medicare HMO | Admitting: Oncology

## 2022-02-01 ENCOUNTER — Inpatient Hospital Stay: Payer: Medicare HMO

## 2022-02-01 ENCOUNTER — Inpatient Hospital Stay: Payer: Medicare HMO | Attending: Oncology

## 2022-02-01 VITALS — BP 161/97 | HR 92 | Temp 98.1°F | Resp 15 | Ht 63.0 in | Wt 141.9 lb

## 2022-02-01 VITALS — BP 139/64 | HR 101 | Temp 98.0°F | Resp 20

## 2022-02-01 DIAGNOSIS — C679 Malignant neoplasm of bladder, unspecified: Secondary | ICD-10-CM | POA: Insufficient documentation

## 2022-02-01 DIAGNOSIS — Z5111 Encounter for antineoplastic chemotherapy: Secondary | ICD-10-CM | POA: Insufficient documentation

## 2022-02-01 DIAGNOSIS — Z95828 Presence of other vascular implants and grafts: Secondary | ICD-10-CM

## 2022-02-01 LAB — CBC WITH DIFFERENTIAL (CANCER CENTER ONLY)
Abs Immature Granulocytes: 0.03 10*3/uL (ref 0.00–0.07)
Basophils Absolute: 0.1 10*3/uL (ref 0.0–0.1)
Basophils Relative: 1 %
Eosinophils Absolute: 0.1 10*3/uL (ref 0.0–0.5)
Eosinophils Relative: 1 %
HCT: 30.8 % — ABNORMAL LOW (ref 36.0–46.0)
Hemoglobin: 10 g/dL — ABNORMAL LOW (ref 12.0–15.0)
Immature Granulocytes: 0 %
Lymphocytes Relative: 39 %
Lymphs Abs: 3 10*3/uL (ref 0.7–4.0)
MCH: 30.6 pg (ref 26.0–34.0)
MCHC: 32.5 g/dL (ref 30.0–36.0)
MCV: 94.2 fL (ref 80.0–100.0)
Monocytes Absolute: 0.7 10*3/uL (ref 0.1–1.0)
Monocytes Relative: 9 %
Neutro Abs: 3.9 10*3/uL (ref 1.7–7.7)
Neutrophils Relative %: 50 %
Platelet Count: 328 10*3/uL (ref 150–400)
RBC: 3.27 MIL/uL — ABNORMAL LOW (ref 3.87–5.11)
RDW: 20.4 % — ABNORMAL HIGH (ref 11.5–15.5)
WBC Count: 7.6 10*3/uL (ref 4.0–10.5)
nRBC: 0 % (ref 0.0–0.2)

## 2022-02-01 LAB — CMP (CANCER CENTER ONLY)
ALT: 7 U/L (ref 0–44)
AST: 13 U/L — ABNORMAL LOW (ref 15–41)
Albumin: 3.5 g/dL (ref 3.5–5.0)
Alkaline Phosphatase: 67 U/L (ref 38–126)
Anion gap: 4 — ABNORMAL LOW (ref 5–15)
BUN: 13 mg/dL (ref 8–23)
CO2: 31 mmol/L (ref 22–32)
Calcium: 8.8 mg/dL — ABNORMAL LOW (ref 8.9–10.3)
Chloride: 101 mmol/L (ref 98–111)
Creatinine: 0.78 mg/dL (ref 0.44–1.00)
GFR, Estimated: >60 mL/min (ref >60)
Glucose, Bld: 127 mg/dL — ABNORMAL HIGH (ref 70–99)
Potassium: 3.5 mmol/L (ref 3.5–5.1)
Sodium: 136 mmol/L (ref 135–145)
Total Bilirubin: 0.4 mg/dL (ref 0.3–1.2)
Total Protein: 6.6 g/dL (ref 6.5–8.1)

## 2022-02-01 LAB — MAGNESIUM: Magnesium: 1.8 mg/dL (ref 1.7–2.4)

## 2022-02-01 MED ORDER — SODIUM CHLORIDE 0.9 % IV SOLN: Freq: Once | INTRAVENOUS | Status: DC

## 2022-02-01 MED ORDER — HEPARIN SOD (PORK) LOCK FLUSH 100 UNIT/ML IV SOLN: 250.0000 [IU] | Freq: Once | INTRAVENOUS | Status: DC | PRN

## 2022-02-01 MED ORDER — POTASSIUM CHLORIDE IN NACL 20-0.9 MEQ/L-% IV SOLN
Freq: Once | INTRAVENOUS | Status: AC
  Filled 2022-02-01: qty 1000

## 2022-02-01 MED ORDER — SODIUM CHLORIDE 0.9 % IV SOLN
10.0000 mg | Freq: Once | INTRAVENOUS | Status: AC
  Administered 2022-02-01: 10 mg via INTRAVENOUS
  Filled 2022-02-01: qty 10

## 2022-02-01 MED ORDER — SODIUM CHLORIDE 0.9 % IV SOLN
150.0000 mg | Freq: Once | INTRAVENOUS | Status: AC
  Administered 2022-02-01: 150 mg via INTRAVENOUS
  Filled 2022-02-01: qty 150

## 2022-02-01 MED ORDER — SODIUM CHLORIDE 0.9% FLUSH: 3.0000 mL | INTRAVENOUS | Status: DC | PRN

## 2022-02-01 MED ORDER — SODIUM CHLORIDE 0.9 % IV SOLN
800.0000 mg/m2 | Freq: Once | INTRAVENOUS | Status: AC
  Administered 2022-02-01: 1368 mg via INTRAVENOUS
  Filled 2022-02-01: qty 35.98

## 2022-02-01 MED ORDER — MAGNESIUM SULFATE 2 GM/50ML IV SOLN
2.0000 g | Freq: Once | INTRAVENOUS | Status: AC
  Administered 2022-02-01: 2 g via INTRAVENOUS
  Filled 2022-02-01: qty 50

## 2022-02-01 MED ORDER — ALTEPLASE 2 MG IJ SOLR: 2.0000 mg | Freq: Once | INTRAMUSCULAR | Status: DC | PRN

## 2022-02-01 MED ORDER — PALONOSETRON HCL INJECTION 0.25 MG/5ML
0.2500 mg | Freq: Once | INTRAVENOUS | Status: AC
  Administered 2022-02-01: 0.25 mg via INTRAVENOUS
  Filled 2022-02-01: qty 5

## 2022-02-01 MED ORDER — SODIUM CHLORIDE 0.9% FLUSH
10.0000 mL | INTRAVENOUS | Status: DC | PRN
  Administered 2022-02-01: 10 mL

## 2022-02-01 MED ORDER — SODIUM CHLORIDE 0.9 % IV SOLN: Freq: Once | INTRAVENOUS | Status: AC

## 2022-02-01 MED ORDER — SODIUM CHLORIDE 0.9% FLUSH
10.0000 mL | Freq: Once | INTRAVENOUS | Status: AC
  Administered 2022-02-01: 10 mL

## 2022-02-01 MED ORDER — SODIUM CHLORIDE 0.9 % IV SOLN
50.0000 mg/m2 | Freq: Once | INTRAVENOUS | Status: AC
  Administered 2022-02-01: 87 mg via INTRAVENOUS
  Filled 2022-02-01: qty 87

## 2022-02-01 MED ORDER — HEPARIN SOD (PORK) LOCK FLUSH 100 UNIT/ML IV SOLN
500.0000 [IU] | Freq: Once | INTRAVENOUS | Status: AC | PRN
  Administered 2022-02-01: 500 [IU]

## 2022-02-01 NOTE — Patient Instructions (Signed)
[facility]  Discharge Instructions: Thank you for choosing [facility] to provide your oncology and hematology care.   If you have a lab appointment with the [facility], please go directly to the [facility] and check in at the registration area.   Wear comfortable clothing and clothing appropriate for easy access to any Portacath or PICC line.   We strive to give you quality time with your provider. You may need to reschedule your appointment if you arrive late (15 or more minutes).  Arriving late affects you and other patients whose appointments are after yours.  Also, if you miss three or more appointments without notifying the office, you may be dismissed from the clinic at the provider's discretion.      For prescription refill requests, have your pharmacy contact our office and allow 72 hours for refills to be completed.    Today you received the following chemotherapy and/or immunotherapy agents: Gemcitabine (Gemzar) and Cisplatin.   To help prevent nausea and vomiting after your treatment, we encourage you to take your nausea medication as directed.  BELOW ARE SYMPTOMS THAT SHOULD BE REPORTED IMMEDIATELY: *FEVER GREATER THAN 100.4 F (38 C) OR HIGHER *CHILLS OR SWEATING *NAUSEA AND VOMITING THAT IS NOT CONTROLLED WITH YOUR NAUSEA MEDICATION *UNUSUAL SHORTNESS OF BREATH *UNUSUAL BRUISING OR BLEEDING *URINARY PROBLEMS (pain or burning when urinating, or frequent urination) *BOWEL PROBLEMS (unusual diarrhea, constipation, pain near the anus) TENDERNESS IN MOUTH AND THROAT WITH OR WITHOUT PRESENCE OF ULCERS (sore throat, sores in mouth, or a toothache) UNUSUAL RASH, SWELLING OR PAIN  UNUSUAL VAGINAL DISCHARGE OR ITCHING   Items with * indicate a potential emergency and should be followed up as soon as possible or go to the Emergency Department if any problems should occur.  Please show the CHEMOTHERAPY ALERT CARD or IMMUNOTHERAPY  ALERT CARD at check-in to the Emergency Department and triage nurse.  Should you have questions after your visit or need to cancel or reschedule your appointment, please contact [facility]  Dept: [phone]  and follow the prompts.  Office hours are 8:00 a.m. to 4:30 p.m. Monday - Friday. Please note that voicemails left after 4:00 p.m. may not be returned until the following business day.  We are closed weekends and major holidays. You have access to a nurse at all times for urgent questions. Please call the main number to the clinic Dept: [phone] and follow the prompts.   For any non-urgent questions, you may also contact your provider using MyChart. We now offer e-Visits for anyone 30 and older to request care online for non-urgent symptoms. For details visit [email or web address].   Also download the MyChart app! Go to the app store, search "MyChart", open the app, select [place], and log in with your MyChart username and password.  Masks are optional in the cancer centers. If you would like for your care team to wear a mask while they are taking care of you, please let them know. You may have one support person who is at least 86 years old accompany you for your appointments.

## 2022-02-01 NOTE — Progress Notes (Signed)
Hematology and Oncology Follow Up Visit  Brandy Wheeler [id number] 1931/09/23 88 y.o. 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    Current therapy: Neoadjuvant chemotherapy utilizing gemcitabine and cisplatin started on November 16, 2021.  He is here for day 1 cycle 4 of therapy.   Interim History: Ms. Brandy Wheeler returns today for a follow-up visit.  Since her last visit, she reports no major changes in her health.  She denies any nausea, vomiting or abdominal pain.  She did have a respiratory illness which caused a delay in her chemotherapy.  She is no longer reporting any fevers or chills or sweats.  She denies any cough or wheezing.  Her performance status quality of life remains unchanged.    Medications: Reviewed without changes. Current Outpatient Medications  Medication Sig Dispense Refill   cephALEXin (KEFLEX) 500 MG capsule Take 500 mg by mouth 3 (three) times daily.     Cholecalciferol (VITAMIN D3 PO) Take 1 tablet by mouth in the morning.     Cyanocobalamin (VITAMIN B-12 PO) Take 1 tablet by mouth in the morning.     ibuprofen (ADVIL) 200 MG tablet Take 400 mg by mouth every 6 (six) hours as needed.     lidocaine-prilocaine (EMLA) cream Apply 1 Application topically as needed. 30 g 0   mirabegron ER (MYRBETRIQ) 50 MG TB24 tablet Take 1 tablet (50 mg total) by mouth daily. 30 tablet 0   Misc Natural Products (COLON CLEANSE PO) Take 1 capsule by mouth daily as needed (constipation.). Renew Life Cleanse More     Multiple Minerals-Vitamins (BONE DENSITY BUILDER PO) Take 1 tablet by mouth in the morning. [employer] Bone Builder Forte     Multiple Vitamin (MULTIVITAMIN WITH MINERALS) TABS tablet  Take 1 tablet by mouth in the morning.     omeprazole (PRILOSEC) 20 MG capsule Take 20 mg by mouth in the morning.     oxyCODONE (OXY IR/ROXICODONE) 5 MG immediate release tablet Take 1 tablet (5 mg total) by mouth every 4 (four) hours as needed for severe pain. (Patient not taking: Reported on 11/03/2021) 20 tablet 0   prochlorperazine (COMPAZINE) 10 MG tablet Take 1 tablet (10 mg total) by mouth every 6 (six) hours as needed for nausea or vomiting. 30 tablet 0   No current facility-administered medications for this visit.     Allergies: No Known Allergies    Physical Exam:  Blood pressure (!) 161/97, pulse 92, temperature 98.1 F (36.7 C), temperature source Temporal, resp. rate 15, height '5\' 3"'$  (1.6 m), weight 141 lb 14.4 oz (64.4 kg).   ECOG: 1    General appearance: Comfortable appearing without any discomfort Head: Normocephalic without any trauma Oropharynx: Mucous membranes are moist and pink without any thrush or ulcers. Eyes: Pupils are equal and round reactive to light. Lymph nodes: No cervical, supraclavicular, inguinal or axillary lymphadenopathy.   Heart:regular rate and rhythm.  S1 and S2 without leg edema. Lung: Clear without any rhonchi or wheezes.  No dullness to percussion. Abdomin: Soft, nontender, nondistended with good bowel sounds.  No hepatosplenomegaly. Musculoskeletal: No joint deformity or effusion.  Full range of motion noted. Neurological: No deficits noted on motor, sensory and deep tendon reflex exam. Skin: No  petechial rash or dryness.  Appeared moist.  P     Lab Results: Lab Results  Component Value Date   WBC 8.0 01/04/2022   HGB 10.0 (L) 01/04/2022   HCT 31.0 (L) 01/04/2022   MCV 93.7 01/04/2022   PLT 221 01/04/2022     Chemistry      Component Value Date/Time   NA 135 01/04/2022 1315   K 3.4 (L) 01/04/2022 1315   CL 98 01/04/2022 1315   CO2 32 01/04/2022 1315   BUN 15 01/04/2022 1315   CREATININE 0.77 01/04/2022 1315       Component Value Date/Time   CALCIUM 9.1 01/04/2022 1315   ALKPHOS 82 01/04/2022 1315   AST 18 01/04/2022 1315   ALT 12 01/04/2022 1315   BILITOT 0.2 (L) 01/04/2022 1315         Impression and Plan:   88 year old with:  1. T3 high-grade urothelial carcinoma of the bladder diagnosed in June 2023.  I disease status was updated at this time treatment choices were discussed.  She is currently receiving neoadjuvant chemotherapy in preparation for radical cystectomy.  Risks and benefits of proceeding with cycle 4 of therapy were discussed.  Complications that include nausea, vomiting suppression, neutropenia and possible sepsis were reiterated.  We will update staging scans upon completing the current colon before preparation for cystectomy.  She is agreeable to proceed and we will update her staging scan in November.    2.  IV access: Port-A-Cath remains in place without any issues.   3.  Antiemetics: No nausea or vomiting reported at this time.  Compazine is available to her.   4.  Renal function surveillance: Kidney function remains stable at this time we will continue to monitor on platinum based therapy.   5.  Goals of care: Aggressive measures are warranted at this time.  Treatment is curative.   6.  Follow-up: In 1 week to complete the current cycle of therapy.   30  minutes were spent on this visit.  Time was went on updating disease status, complication related to cancer, cancer therapy and future plan of care reviewed.      Zola Button, MD 10/12/20238:12 AM

## 2022-02-01 NOTE — Progress Notes (Signed)
Patient is incontinent. Okay to proceed with treatment without measurable urine output per Dr. Alen Blew.

## 2022-02-03 ENCOUNTER — Other Ambulatory Visit: Payer: Self-pay

## 2022-02-05 ENCOUNTER — Other Ambulatory Visit: Payer: Self-pay

## 2022-02-05 ENCOUNTER — Telehealth: Payer: Self-pay | Admitting: Oncology

## 2022-02-05 NOTE — Telephone Encounter (Signed)
Scheduled per 10/12 los, patient has been called regarding upcoming appointments. Left a voicemail.

## 2022-02-08 ENCOUNTER — Other Ambulatory Visit: Payer: Self-pay

## 2022-02-08 ENCOUNTER — Inpatient Hospital Stay: Payer: Medicare HMO

## 2022-02-08 VITALS — BP 106/93 | HR 103 | Temp 98.4°F | Resp 18 | Ht 63.0 in | Wt 142.5 lb

## 2022-02-08 DIAGNOSIS — Z95828 Presence of other vascular implants and grafts: Secondary | ICD-10-CM

## 2022-02-08 DIAGNOSIS — Z5111 Encounter for antineoplastic chemotherapy: Secondary | ICD-10-CM | POA: Diagnosis not present

## 2022-02-08 DIAGNOSIS — C679 Malignant neoplasm of bladder, unspecified: Secondary | ICD-10-CM | POA: Diagnosis not present

## 2022-02-08 LAB — CMP (CANCER CENTER ONLY)
ALT: 8 U/L (ref 0–44)
AST: 13 U/L — ABNORMAL LOW (ref 15–41)
Albumin: 3.5 g/dL (ref 3.5–5.0)
Alkaline Phosphatase: 70 U/L (ref 38–126)
Anion gap: 6 (ref 5–15)
BUN: 14 mg/dL (ref 8–23)
CO2: 32 mmol/L (ref 22–32)
Calcium: 8.9 mg/dL (ref 8.9–10.3)
Chloride: 97 mmol/L — ABNORMAL LOW (ref 98–111)
Creatinine: 0.76 mg/dL (ref 0.44–1.00)
GFR, Estimated: >60 mL/min (ref >60)
Glucose, Bld: 164 mg/dL — ABNORMAL HIGH (ref 70–99)
Potassium: 3.5 mmol/L (ref 3.5–5.1)
Sodium: 135 mmol/L (ref 135–145)
Total Bilirubin: 0.2 mg/dL — ABNORMAL LOW (ref 0.3–1.2)
Total Protein: 6.3 g/dL — ABNORMAL LOW (ref 6.5–8.1)

## 2022-02-08 LAB — CBC WITH DIFFERENTIAL (CANCER CENTER ONLY)
Abs Immature Granulocytes: 0.01 10*3/uL (ref 0.00–0.07)
Basophils Absolute: 0 10*3/uL (ref 0.0–0.1)
Basophils Relative: 1 %
Eosinophils Absolute: 0.1 10*3/uL (ref 0.0–0.5)
Eosinophils Relative: 2 %
HCT: 27.8 % — ABNORMAL LOW (ref 36.0–46.0)
Hemoglobin: 9.3 g/dL — ABNORMAL LOW (ref 12.0–15.0)
Immature Granulocytes: 0 %
Lymphocytes Relative: 58 %
Lymphs Abs: 2.8 10*3/uL (ref 0.7–4.0)
MCH: 31.3 pg (ref 26.0–34.0)
MCHC: 33.5 g/dL (ref 30.0–36.0)
MCV: 93.6 fL (ref 80.0–100.0)
Monocytes Absolute: 0.3 10*3/uL (ref 0.1–1.0)
Monocytes Relative: 7 %
Neutro Abs: 1.5 10*3/uL — ABNORMAL LOW (ref 1.7–7.7)
Neutrophils Relative %: 32 %
Platelet Count: 191 10*3/uL (ref 150–400)
RBC: 2.97 MIL/uL — ABNORMAL LOW (ref 3.87–5.11)
RDW: 18.8 % — ABNORMAL HIGH (ref 11.5–15.5)
WBC Count: 4.8 10*3/uL (ref 4.0–10.5)
nRBC: 0.6 % — ABNORMAL HIGH (ref 0.0–0.2)

## 2022-02-08 MED ORDER — HEPARIN SOD (PORK) LOCK FLUSH 100 UNIT/ML IV SOLN
500.0000 [IU] | Freq: Once | INTRAVENOUS | Status: AC | PRN
  Administered 2022-02-08: 500 [IU]

## 2022-02-08 MED ORDER — SODIUM CHLORIDE 0.9% FLUSH
10.0000 mL | Freq: Once | INTRAVENOUS | Status: AC
  Administered 2022-02-08: 10 mL

## 2022-02-08 MED ORDER — SODIUM CHLORIDE 0.9 % IV SOLN: Freq: Once | INTRAVENOUS | Status: AC

## 2022-02-08 MED ORDER — PROCHLORPERAZINE MALEATE 10 MG PO TABS
10.0000 mg | ORAL_TABLET | Freq: Once | ORAL | Status: AC
  Administered 2022-02-08: 10 mg via ORAL
  Filled 2022-02-08: qty 1

## 2022-02-08 MED ORDER — SODIUM CHLORIDE 0.9% FLUSH
10.0000 mL | INTRAVENOUS | Status: DC | PRN
  Administered 2022-02-08: 10 mL

## 2022-02-08 MED ORDER — SODIUM CHLORIDE 0.9 % IV SOLN
800.0000 mg/m2 | Freq: Once | INTRAVENOUS | Status: AC
  Administered 2022-02-08: 1368 mg via INTRAVENOUS
  Filled 2022-02-08: qty 35.98

## 2022-02-08 NOTE — Patient Instructions (Signed)
[facility]  Discharge Instructions: Thank you for choosing [facility] to provide your oncology and hematology care.   If you have a lab appointment with the Cancer Center, please go directly to the Cancer Center and check in at the registration area.   Wear comfortable clothing and clothing appropriate for easy access to any Portacath or PICC line.   We strive to give you quality time with your provider. You may need to reschedule your appointment if you arrive late (15 or more minutes).  Arriving late affects you and other patients whose appointments are after yours.  Also, if you miss three or more appointments without notifying the office, you may be dismissed from the clinic at the provider's discretion.      For prescription refill requests, have your pharmacy contact our office and allow 72 hours for refills to be completed.    Today you received the following chemotherapy and/or immunotherapy agents: Gemzar      To help prevent nausea and vomiting after your treatment, we encourage you to take your nausea medication as directed.  BELOW ARE SYMPTOMS THAT SHOULD BE REPORTED IMMEDIATELY: *FEVER GREATER THAN 100.4 F (38 C) OR HIGHER *CHILLS OR SWEATING *NAUSEA AND VOMITING THAT IS NOT CONTROLLED WITH YOUR NAUSEA MEDICATION *UNUSUAL SHORTNESS OF BREATH *UNUSUAL BRUISING OR BLEEDING *URINARY PROBLEMS (pain or burning when urinating, or frequent urination) *BOWEL PROBLEMS (unusual diarrhea, constipation, pain near the anus) TENDERNESS IN MOUTH AND THROAT WITH OR WITHOUT PRESENCE OF ULCERS (sore throat, sores in mouth, or a toothache) UNUSUAL RASH, SWELLING OR PAIN  UNUSUAL VAGINAL DISCHARGE OR ITCHING   Items with * indicate a potential emergency and should be followed up as soon as possible or go to the Emergency Department if any problems should occur.  Please show the CHEMOTHERAPY ALERT CARD or IMMUNOTHERAPY ALERT CARD at check-in to the  Emergency Department and triage nurse.  Should you have questions after your visit or need to cancel or reschedule your appointment, please contact [facility]  Dept: [phone]  and follow the prompts.  Office hours are 8:00 a.m. to 4:30 p.m. Monday - Friday. Please note that voicemails left after 4:00 p.m. may not be returned until the following business day.  We are closed weekends and major holidays. You have access to a nurse at all times for urgent questions. Please call the main number to the clinic Dept: [phone] and follow the prompts.   For any non-urgent questions, you may also contact your provider using MyChart. We now offer e-Visits for anyone 18 and older to request care online for non-urgent symptoms. For details visit [email or web address].   Also download the MyChart app! Go to the app store, search "MyChart", open the app, select [facility], and log in with your MyChart username and password.  Masks are optional in the cancer centers. If you would like for your care team to wear a mask while they are taking care of you, please let them know. You may have one support person who is at least 86 years old accompany you for your appointments. 

## 2022-02-14 ENCOUNTER — Telehealth: Payer: Self-pay | Admitting: *Deleted

## 2022-02-14 NOTE — Telephone Encounter (Signed)
PC to patient, informed her her CT scan has been authorized by her insurance & may be scheduled by [employer], [phone].  Patient verbalizes understanding.

## 2022-03-01 ENCOUNTER — Ambulatory Visit (HOSPITAL_COMMUNITY)
Admission: RE | Admit: 2022-03-01 | Discharge: 2022-03-01 | Disposition: A | Discharge status: Home / Self Care | Payer: Medicare HMO | Source: Ambulatory Visit | Attending: Oncology | Admitting: Oncology

## 2022-03-01 DIAGNOSIS — J189 Pneumonia, unspecified organism: Secondary | ICD-10-CM | POA: Diagnosis not present

## 2022-03-01 DIAGNOSIS — N3289 Other specified disorders of bladder: Secondary | ICD-10-CM | POA: Diagnosis not present

## 2022-03-01 DIAGNOSIS — K5730 Diverticulosis of large intestine without perforation or abscess without bleeding: Secondary | ICD-10-CM | POA: Diagnosis not present

## 2022-03-01 DIAGNOSIS — C679 Malignant neoplasm of bladder, unspecified: Secondary | ICD-10-CM | POA: Diagnosis not present

## 2022-03-01 DIAGNOSIS — K8020 Calculus of gallbladder without cholecystitis without obstruction: Secondary | ICD-10-CM | POA: Diagnosis not present

## 2022-03-01 DIAGNOSIS — I700 Atherosclerosis of aorta: Secondary | ICD-10-CM | POA: Diagnosis not present

## 2022-03-01 DIAGNOSIS — K449 Diaphragmatic hernia without obstruction or gangrene: Secondary | ICD-10-CM | POA: Diagnosis not present

## 2022-03-01 MED ORDER — IOHEXOL 300 MG/ML  SOLN
100.0000 mL | Freq: Once | INTRAMUSCULAR | Status: AC | PRN
  Administered 2022-03-01: 100 mL via INTRAVENOUS

## 2022-03-02 ENCOUNTER — Other Ambulatory Visit: Payer: Medicare HMO

## 2022-03-06 ENCOUNTER — Telehealth: Payer: Self-pay | Admitting: Oncology

## 2022-03-06 DIAGNOSIS — N3946 Mixed incontinence: Secondary | ICD-10-CM | POA: Diagnosis not present

## 2022-03-06 DIAGNOSIS — N130 Hydronephrosis with ureteropelvic junction obstruction: Secondary | ICD-10-CM | POA: Diagnosis not present

## 2022-03-06 DIAGNOSIS — D414 Neoplasm of uncertain behavior of bladder: Secondary | ICD-10-CM | POA: Diagnosis not present

## 2022-03-06 DIAGNOSIS — G893 Neoplasm related pain (acute) (chronic): Secondary | ICD-10-CM | POA: Diagnosis not present

## 2022-03-06 NOTE — Telephone Encounter (Signed)
Left message about appt change per 11/14 in  basket

## 2022-03-07 ENCOUNTER — Inpatient Hospital Stay: Payer: Medicare HMO | Admitting: Oncology

## 2022-03-07 ENCOUNTER — Other Ambulatory Visit: Payer: Self-pay

## 2022-03-07 ENCOUNTER — Inpatient Hospital Stay: Payer: Medicare HMO | Attending: Oncology | Admitting: Oncology

## 2022-03-07 VITALS — BP 163/70 | HR 98 | Temp 97.8°F | Resp 16 | Ht 63.0 in | Wt 146.3 lb

## 2022-03-07 DIAGNOSIS — K8020 Calculus of gallbladder without cholecystitis without obstruction: Secondary | ICD-10-CM | POA: Insufficient documentation

## 2022-03-07 DIAGNOSIS — I2510 Atherosclerotic heart disease of native coronary artery without angina pectoris: Secondary | ICD-10-CM | POA: Insufficient documentation

## 2022-03-07 DIAGNOSIS — C679 Malignant neoplasm of bladder, unspecified: Secondary | ICD-10-CM | POA: Insufficient documentation

## 2022-03-07 NOTE — Progress Notes (Signed)
Hematology and Oncology Follow Up Visit  Brandy Wheeler [id number] [birth year] 88 y.o. 03/07/2022 11:33 AM Pcp, Hoyle Sauer, MD   Principle Diagnosis: 88 year old woman with bladder cancer diagnosed in June 2023.  She was found to have T3 N0 high-grade urothelial carcinoma.    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    Neoadjuvant chemotherapy utilizing gemcitabine and cisplatin started on November 16, 2021.  She completed 4 cycles of therapy on February 08, 2022.  Current therapy: Under consideration for radical cystectomy.  Interim History: Brandy Wheeler presents today for return follow-up.  Since her last visit, she completed last cycle of chemotherapy without any complications.  She denies any residual nausea, vomiting or abdominal pain.  She denies any hospitalizations or illnesses.  She denies any worsening neuropathy, fatigue or any other residual issues related to chemotherapy.   Medications: Updated on review. Current Outpatient Medications  Medication Sig Dispense Refill   cephALEXin (KEFLEX) 500 MG capsule Take 500 mg by mouth 3 (three) times daily.     Cholecalciferol (VITAMIN D3 PO) Take 1 tablet by mouth in the morning.     Cyanocobalamin (VITAMIN B-12 PO) Take 1 tablet by mouth in the morning.     ibuprofen (ADVIL) 200 MG tablet Take 400 mg by mouth every 6 (six) hours as needed.     lidocaine-prilocaine (EMLA) cream Apply 1 Application topically as needed. 30 g 0   mirabegron ER (MYRBETRIQ) 50 MG TB24 tablet Take 1 tablet (50 mg total) by mouth daily. 30 tablet 0   Misc Natural Products (COLON CLEANSE PO) Take 1 capsule by mouth daily as needed (constipation.). Renew Life Cleanse More     Multiple Minerals-Vitamins (BONE DENSITY BUILDER PO) Take 1 tablet by mouth in the morning. [employer] Bone Builder Forte     Multiple Vitamin (MULTIVITAMIN  WITH MINERALS) TABS tablet Take 1 tablet by mouth in the morning.     omeprazole (PRILOSEC) 20 MG capsule Take 20 mg by mouth in the morning.     oxyCODONE (OXY IR/ROXICODONE) 5 MG immediate release tablet Take 1 tablet (5 mg total) by mouth every 4 (four) hours as needed for severe pain. (Patient not taking: Reported on 11/03/2021) 20 tablet 0   prochlorperazine (COMPAZINE) 10 MG tablet Take 1 tablet (10 mg total) by mouth every 6 (six) hours as needed for nausea or vomiting. 30 tablet 0   No current facility-administered medications for this visit.     Allergies: No Known Allergies    Physical Exam:  Blood pressure (!) 163/70, pulse 98, temperature 97.8 F (36.6 C), temperature source Temporal, resp. rate 16, height '5\' 3"'$  (1.6 m), weight 146 lb 4.8 oz (66.4 kg), SpO2 100 %.   ECOG: 1    General appearance: Alert, awake without any distress. Head: Atraumatic without abnormalities Oropharynx: Without any thrush or ulcers. Eyes: No scleral icterus. Lymph nodes: No lymphadenopathy noted in the cervical, supraclavicular, or axillary nodes Heart:regular rate and rhythm, without any murmurs or gallops.   Lung: Clear to auscultation without any rhonchi, wheezes or dullness to percussion. Abdomin: Soft, nontender without any shifting dullness or ascites. Musculoskeletal: No clubbing or cyanosis. Neurological: No motor or sensory deficits. Skin: No rashes or lesions.      Lab Results: Lab Results  Component Value Date   WBC 4.8 02/08/2022   HGB 9.3 (L) 02/08/2022   HCT 27.8 (L)  02/08/2022   MCV 93.6 02/08/2022   PLT 191 02/08/2022     Chemistry      Component Value Date/Time   NA 135 02/08/2022 1238   K 3.5 02/08/2022 1238   CL 97 (L) 02/08/2022 1238   CO2 32 02/08/2022 1238   BUN 14 02/08/2022 1238   CREATININE 0.76 02/08/2022 1238      Component Value Date/Time   CALCIUM 8.9 02/08/2022 1238   ALKPHOS 70 02/08/2022 1238   AST 13 (L) 02/08/2022 1238   ALT 8  02/08/2022 1238   BILITOT 0.2 (L) 02/08/2022 1238     MPRESSION: 1. Severe urinary bladder wall thickening with adjacent fat stranding, consistent with nonspecific infectious or inflammatory cystitis. No mass or focal contrast enhancement appreciated. 2. Redemonstrated air within the urinary bladder, presumably secondary to recent catheterization. There is substantial diverticulosis of the adjacent sigmoid colon, however this appears to remain separated from the bladder dome by a clearly defined fat plane and there is no direct evidence of enterovesical fistula. 3. No evidence of lymphadenopathy or metastatic disease in the chest, abdomen, or pelvis. 4. New centrilobular nodularity and consolidation of the right middle lobe and lingula. Dominant new subpleural nodular consolidation of the medial segment right middle lobe measures 1.3 x 1.1 cm. Although this is almost certainly infectious or inflammatory, continued attention on follow-up is warranted. Constellation of findings is highly suggestive of atypical mycobacterial infection. 5. Unchanged small nodule of the peripheral right upper lobe measuring 0.5 cm, almost certainly benign and incidental. Continued attention on follow-up. 6. Cholelithiasis. 7. Coronary artery disease.    Impression and Plan:   88 year old with:  1.  Bladder cancer diagnosed in June 2023.  She was found to have T3 high-grade urothelial carcinoma.  Her disease status was updated at this time and treatment choices were reviewed.  CT scan obtained on March 01, 2022 showed no evidence of metastatic disease at this time.  She was evaluated by Dr. Bess Harvest and felt that she would be a reasonable candidate for radical cystectomy after excellent response and benefit from chemotherapy.  She is agreeable to proceed, and pending the results of her pathology additional treatment may be required.  Adjuvant nivolumab has been approved after neoadjuvant chemotherapy  and radical cystectomy if she has T2 tumor left or any positive lymph nodes.  Nivolumab at 480 mg every 4 weeks to complete a year will be recommended at that time.  If she has a little residual disease of less than T2, then active surveillance would be recommended.  She understands this plan and agreeable to proceed.  Her surgery is tentatively will be around the end of December to early January   2.  IV access: Port-A-Cath will continue to be flushed every 6 weeks.  This can be removed if no additional treatment is recommended pending her final pathology.     3.  Follow-up: She will return in 6 weeks for a Port-A-Cath flush and in 3 months for evaluation after her surgery.   30  minutes were dedicated to this encounter.  Time was spent on updating disease status, treatment choices and addressing complications of cancer and cancer therapy.      Zola Button, MD 11/15/202311:33 AM

## 2022-03-08 ENCOUNTER — Other Ambulatory Visit: Payer: Self-pay | Admitting: Urology

## 2022-03-20 DIAGNOSIS — R8279 Other abnormal findings on microbiological examination of urine: Secondary | ICD-10-CM | POA: Diagnosis not present

## 2022-03-20 DIAGNOSIS — G893 Neoplasm related pain (acute) (chronic): Secondary | ICD-10-CM | POA: Diagnosis not present

## 2022-03-26 ENCOUNTER — Telehealth: Payer: Self-pay | Admitting: Oncology

## 2022-03-26 NOTE — Telephone Encounter (Signed)
Called patient to inform her about provider's departure. Patient is notified and has been rescheduled with new provider in February. Patient is notified.

## 2022-04-09 ENCOUNTER — Ambulatory Visit (HOSPITAL_COMMUNITY)
Admission: RE | Admit: 2022-04-09 | Discharge: 2022-04-09 | Disposition: A | Discharge status: Home / Self Care | Payer: Medicare HMO | Source: Ambulatory Visit | Attending: Nurse Practitioner | Admitting: Nurse Practitioner

## 2022-04-09 DIAGNOSIS — Z432 Encounter for attention to ileostomy: Secondary | ICD-10-CM | POA: Insufficient documentation

## 2022-04-09 DIAGNOSIS — Z7189 Other specified counseling: Secondary | ICD-10-CM

## 2022-04-09 DIAGNOSIS — Z436 Encounter for attention to other artificial openings of urinary tract: Secondary | ICD-10-CM | POA: Diagnosis not present

## 2022-04-09 NOTE — Progress Notes (Signed)
[facility]   Reason for visit:  Presurgical marking for ileostomy.   HPI:   Past Medical History:  Diagnosis Date   Diverticulitis    Fracture of right fibula 10/10/2013   closed   GERD (gastroesophageal reflux disease)    History of hiatal hernia    White coat syndrome with high blood pressure but without hypertension    No family history on file. No Known Allergies Current Outpatient Medications  Medication Sig Dispense Refill Last Dose   cephALEXin (KEFLEX) 500 MG capsule Take 500 mg by mouth 3 (three) times daily.      Cholecalciferol (VITAMIN D3 PO) Take 1 tablet by mouth in the morning.      Cyanocobalamin (VITAMIN B-12 PO) Take 1 tablet by mouth in the morning.      ibuprofen (ADVIL) 200 MG tablet Take 400 mg by mouth every 6 (six) hours as needed.      lidocaine-prilocaine (EMLA) cream Apply 1 Application topically as needed. 30 g 0    mirabegron ER (MYRBETRIQ) 50 MG TB24 tablet Take 1 tablet (50 mg total) by mouth daily. 30 tablet 0    Misc Natural Products (COLON CLEANSE PO) Take 1 capsule by mouth daily as needed (constipation.). Renew Life Cleanse More      Multiple Minerals-Vitamins (BONE DENSITY BUILDER PO) Take 1 tablet by mouth in the morning. [employer] Bone Builder Forte      Multiple Vitamin (MULTIVITAMIN WITH MINERALS) TABS tablet Take 1 tablet by mouth in the morning.      omeprazole (PRILOSEC) 20 MG capsule Take 20 mg by mouth in the morning.      oxyCODONE (OXY IR/ROXICODONE) 5 MG immediate release tablet Take 1 tablet (5 mg total) by mouth every 4 (four) hours as needed for severe pain. (Patient not taking: Reported on 11/03/2021) 20 tablet 0    prochlorperazine (COMPAZINE) 10 MG tablet Take 1 tablet (10 mg total) by mouth every 6 (six) hours as needed for nausea or vomiting. 30 tablet 0    No current facility-administered medications for this encounter.   ROS  Review of Systems  Gastrointestinal:        Marking for ileal conduit in RMQ,  above umbilicus.  Cannot visualize stoma below umbilicus   Genitourinary:        Bladder tumor  Skin: Negative.   All other systems reviewed and are negative.  Vital signs:  There were no vitals taken for this visit. Exam:  Physical Exam Constitutional:      Appearance: Normal appearance.  Abdominal:     Palpations: Abdomen is soft.  Skin:    General: Skin is warm and dry.  Neurological:     Mental Status: She is alert and oriented to person, place, and time.  Psychiatric:        Mood and Affect: Mood normal.        Behavior: Behavior normal.       Discussed surgical procedure and stoma creation with patient and family.  Patient is here with her daughter.  She has family members in healthcare, but they all live out of state.  Will manage her ostomy independently. Explained role of the [place] nurse team.  Provided the patient with educational booklet and provided samples of pouching options.  Answered patient and family questions.   Examined patient lying, sitting, and standing in order to place the marking in the patient's visual field, away from any creases or abdominal contour issues and within the rectus  muscle    Marked for ileal conduit in the RLQ 4  cm to the right of the umbilicus and  1.5 cm above the umbilicus.   Patient's abdomen cleansed with CHG wipes at site markings, allowed to air dry prior to marking.Covered mark with thin film transparent dressing to preserve mark until date of surgery.    Impression/dx  Presurgical education for ileal conduit Discussion  Life with an ostomy Plan  See back post operatively    Visit time: 45 minutes.   Domenic Moras FNP-BC

## 2022-04-11 DIAGNOSIS — Z7189 Other specified counseling: Secondary | ICD-10-CM | POA: Insufficient documentation

## 2022-04-11 DIAGNOSIS — Z436 Encounter for attention to other artificial openings of urinary tract: Secondary | ICD-10-CM | POA: Insufficient documentation

## 2022-04-11 NOTE — Discharge Instructions (Signed)
Follow up in clinic postoperatively.

## 2022-04-18 ENCOUNTER — Other Ambulatory Visit: Payer: Medicare HMO

## 2022-04-21 ENCOUNTER — Inpatient Hospital Stay: Payer: Medicare HMO | Attending: Oncology

## 2022-04-21 VITALS — BP 156/62 | HR 110 | Temp 98.1°F | Resp 20

## 2022-04-21 DIAGNOSIS — C679 Malignant neoplasm of bladder, unspecified: Secondary | ICD-10-CM | POA: Insufficient documentation

## 2022-04-21 DIAGNOSIS — Z95828 Presence of other vascular implants and grafts: Secondary | ICD-10-CM

## 2022-04-21 MED ORDER — HEPARIN SOD (PORK) LOCK FLUSH 100 UNIT/ML IV SOLN
500.0000 [IU] | Freq: Once | INTRAVENOUS | Status: AC
  Administered 2022-04-21: 500 [IU]

## 2022-04-21 MED ORDER — SODIUM CHLORIDE 0.9% FLUSH
10.0000 mL | Freq: Once | INTRAVENOUS | Status: AC
  Administered 2022-04-21: 10 mL

## 2022-04-24 ENCOUNTER — Ambulatory Visit (HOSPITAL_COMMUNITY)
Admission: RE | Admit: 2022-04-24 | Discharge: 2022-04-24 | Disposition: A | Discharge status: Home / Self Care | Payer: Medicare HMO | Source: Ambulatory Visit | Attending: Nurse Practitioner | Admitting: Nurse Practitioner

## 2022-04-24 DIAGNOSIS — Z01818 Encounter for other preprocedural examination: Secondary | ICD-10-CM | POA: Insufficient documentation

## 2022-04-24 DIAGNOSIS — Z719 Counseling, unspecified: Secondary | ICD-10-CM

## 2022-04-24 NOTE — Progress Notes (Signed)
[facility]   Reason for visit:  Patient has additional questions about her stoma marking and life with a stoma.  Here with daughter.  HPI:   Past Medical History:  Diagnosis Date   Bladder cancer ([place])    Diverticulitis    Fracture of right fibula 10/10/2013   closed   GERD (gastroesophageal reflux disease)    History of hiatal hernia    Hypertension    No family history on file. No Known Allergies Current Outpatient Medications  Medication Sig Dispense Refill Last Dose   Cholecalciferol (VITAMIN D3 PO) Take 1 tablet by mouth in the morning.      ciprofloxacin (CIPRO) 500 MG tablet Take 1 tablet (500 mg total) by mouth 2 (two) times daily for 10 days. 20 tablet 0    Cyanocobalamin (VITAMIN B-12 PO) Take 1 tablet by mouth in the morning.      mirabegron ER (MYRBETRIQ) 50 MG TB24 tablet Take 1 tablet (50 mg total) by mouth daily. 30 tablet 0    Multiple Minerals-Vitamins (BONE DENSITY BUILDER PO) Take 1 tablet by mouth in the morning. [employer] Bone Builder Forte      omeprazole (PRILOSEC) 20 MG capsule Take 20 mg by mouth in the morning.      oxyCODONE-acetaminophen (PERCOCET) 5-325 MG tablet Take 1 tablet by mouth every 4 (four) hours as needed for severe pain (May cause constipation). 30 tablet 0    potassium chloride (KLOR-CON M) 10 MEQ tablet Take 2 tablets (20 mEq total) by mouth 2 (two) times daily for 7 days. 28 tablet 0    No current facility-administered medications for this encounter.   ROS  Review of Systems  Genitourinary:  Positive for hematuria and urgency.  Skin: Negative.   All other systems reviewed and are negative.  Vital signs:  Pulse (!) 120   Temp 97.7 F (36.5 C) (Oral)   Resp 18   SpO2 94%  Exam:  Physical Exam Vitals reviewed.  Constitutional:      Appearance: Normal appearance.  Abdominal:     Palpations: Abdomen is soft.  Neurological:     Mental Status: She is alert and oriented to person, place, and time.  Psychiatric:         Mood and Affect: Mood normal.        Behavior: Behavior normal.     Education provided:  We discussed marking, it is slightly above umbilicus.  Patient has sagging lower abdominal skin that would prohibit viewing of her stoma herself. She states her daughter is going to be caring for her after surgery and can do it.  I strongly advise her to consider options that will give her the most security.  I use the stoma model and show her how the stoma will be located both above and below umbilicus.  Patient and daughter feel the original higher marking will be best.  No changes are made.     Impression/dx  Presurgical counseling Discussion  Site markings for ileal conduit Plan  See in clinic postoperatively     Visit time: 35 minutes.   Domenic Moras FNP-BC

## 2022-04-28 ENCOUNTER — Emergency Department (HOSPITAL_COMMUNITY): Payer: Medicare HMO

## 2022-04-28 ENCOUNTER — Other Ambulatory Visit: Payer: Self-pay

## 2022-04-28 ENCOUNTER — Encounter (HOSPITAL_COMMUNITY): Payer: Self-pay

## 2022-04-28 ENCOUNTER — Emergency Department (HOSPITAL_COMMUNITY)
Admission: EM | Admit: 2022-04-28 | Discharge: 2022-04-29 | Disposition: A | Discharge status: Home / Self Care | Payer: Medicare HMO | Attending: Emergency Medicine | Admitting: Emergency Medicine

## 2022-04-28 DIAGNOSIS — N3090 Cystitis, unspecified without hematuria: Secondary | ICD-10-CM | POA: Diagnosis not present

## 2022-04-28 DIAGNOSIS — Z743 Need for continuous supervision: Secondary | ICD-10-CM | POA: Diagnosis not present

## 2022-04-28 DIAGNOSIS — N390 Urinary tract infection, site not specified: Secondary | ICD-10-CM | POA: Insufficient documentation

## 2022-04-28 DIAGNOSIS — X58XXXA Exposure to other specified factors, initial encounter: Secondary | ICD-10-CM | POA: Insufficient documentation

## 2022-04-28 DIAGNOSIS — S8992XA Unspecified injury of left lower leg, initial encounter: Secondary | ICD-10-CM | POA: Diagnosis present

## 2022-04-28 DIAGNOSIS — R609 Edema, unspecified: Secondary | ICD-10-CM | POA: Diagnosis not present

## 2022-04-28 DIAGNOSIS — I10 Essential (primary) hypertension: Secondary | ICD-10-CM | POA: Insufficient documentation

## 2022-04-28 DIAGNOSIS — M25572 Pain in left ankle and joints of left foot: Secondary | ICD-10-CM | POA: Diagnosis not present

## 2022-04-28 DIAGNOSIS — S8255XA Nondisplaced fracture of medial malleolus of left tibia, initial encounter for closed fracture: Secondary | ICD-10-CM | POA: Insufficient documentation

## 2022-04-28 DIAGNOSIS — C679 Malignant neoplasm of bladder, unspecified: Secondary | ICD-10-CM | POA: Diagnosis not present

## 2022-04-28 DIAGNOSIS — C7982 Secondary malignant neoplasm of genital organs: Secondary | ICD-10-CM | POA: Insufficient documentation

## 2022-04-28 DIAGNOSIS — M549 Dorsalgia, unspecified: Secondary | ICD-10-CM | POA: Diagnosis not present

## 2022-04-28 DIAGNOSIS — K5730 Diverticulosis of large intestine without perforation or abscess without bleeding: Secondary | ICD-10-CM | POA: Diagnosis not present

## 2022-04-28 DIAGNOSIS — M545 Low back pain, unspecified: Secondary | ICD-10-CM | POA: Insufficient documentation

## 2022-04-28 DIAGNOSIS — E876 Hypokalemia: Secondary | ICD-10-CM | POA: Insufficient documentation

## 2022-04-28 DIAGNOSIS — Z79899 Other long term (current) drug therapy: Secondary | ICD-10-CM | POA: Insufficient documentation

## 2022-04-28 DIAGNOSIS — K8020 Calculus of gallbladder without cholecystitis without obstruction: Secondary | ICD-10-CM | POA: Diagnosis not present

## 2022-04-28 HISTORY — DX: Essential (primary) hypertension: I10

## 2022-04-28 HISTORY — DX: Malignant neoplasm of bladder, unspecified: C67.9

## 2022-04-28 MED ORDER — FENTANYL CITRATE PF 50 MCG/ML IJ SOSY
50.0000 ug | PREFILLED_SYRINGE | Freq: Once | INTRAMUSCULAR | Status: AC
  Administered 2022-04-29: 50 ug via INTRAVENOUS
  Filled 2022-04-28: qty 1

## 2022-04-28 NOTE — ED Triage Notes (Addendum)
Patient arrives [facility] c/o lower back pain, left ankle and leg swelling that progressively worsened since last chemo on 02/08/22; Pt has hx of bladder cancer and HTN; Pt also states she has reoccuring bladder infections and "when she has them, her back pain increases." Pt states she has bladder surgery scheduled with Dr. Tammi Klippel on 05/10/22. Pt states she "hasn't been able to walk on leg" for the past 2 weeks.  EMS vitals:  204/100 118 HR 16 RR 96% O2 on RA

## 2022-04-28 NOTE — ED Provider Notes (Signed)
[facility] DEPT Provider Note: Brandy Spurling, MD, FACEP  CSN: [id number] MRN: [id number] ARRIVAL: 04/28/22 at [place]: [place]  04/28/22 10:55 PM Brandy Wheeler is a 89 y.o. female with bladder cancer and recurrent UTIs scheduled for laparoscopic complete cystectomy with ileal conduit by Dr. Tresa Moore on 05/09/2022.  She is here with low back pain (across the entire lower back) that began 2 days ago.  It is consistent with the back pain she gets with urinary tract infections.  With previous urinary tract infections she has been treated with Cipro successfully.  On this occasion the Cipro was held pending her cystectomy, so she is not currently on an antibiotic.  Due to her cancer she is incontinent of urine.  She rates her back pain as a 10 out of 10.  She has had bilateral lower extremity edema since July of last year.  After completing chemotherapy on October 17 the lower extremity edema worsened.  It had been worse on the right but is now worse on the left.  There is associated pain in her left lower leg severe enough to prevent her from walking now for 2 weeks.  The left lower extremity is warm to the touch but not erythematous.  She twisted her ankle on the left several days ago and is now having pain and swelling in that ankle.   Past Medical History:  Diagnosis Date   Bladder cancer ([place])    Diverticulitis    Fracture of right fibula 10/10/2013   closed   GERD (gastroesophageal reflux disease)    History of hiatal hernia    Hypertension     Past Surgical History:  Procedure Laterality Date   ABDOMINAL HYSTERECTOMY  1978   APPENDECTOMY     AGE 26   CYSTOSCOPY W/ URETERAL STENT PLACEMENT Right 10/09/2021   Procedure: POSSIBLE BILATERAL  RETROGRADE POSSIBLE RIGHT Melene Muller STENT;  Surgeon: Lucas Mallow, MD;  Location: [facility];  Service: Urology;  Laterality: Right;   DIAGNOSTIC LAPAROSCOPY  1974   IR IMAGING  GUIDED PORT INSERTION  11/15/2021   Sacrohysteropexy  1956   to lift uterus prior to getting pregnant   TRANSURETHRAL RESECTION OF BLADDER TUMOR Bilateral 10/09/2021   Procedure: TRANSURETHRAL RESECTION OF BLADDER TUMOR (TURBT) POSSIBLE BILATERAL RETROGRADE POSSIBLE RIGHT URETERAL STENT;  Surgeon: Lucas Mallow, MD;  Location: [facility];  Service: Urology;  Laterality: Bilateral;    History reviewed. No pertinent family history.  Social History   Tobacco Use   Smoking status: Never   Smokeless tobacco: Never  Vaping Use   Vaping Use: Never used  Substance Use Topics   Alcohol use: Yes   Drug use: No    Prior to Admission medications   Medication Sig Start Date End Date Taking? Authorizing Provider  Cholecalciferol (VITAMIN D3 PO) Take 1 tablet by mouth in the morning.   Yes [provider]  ciprofloxacin (CIPRO) 500 MG tablet Take 1 tablet (500 mg total) by mouth 2 (two) times daily for 10 days. 04/29/22 05/09/22 Yes Udell Mazzocco, MD  Cyanocobalamin (VITAMIN B-12 PO) Take 1 tablet by mouth in the morning.   Yes [provider]  Multiple Minerals-Vitamins (BONE DENSITY BUILDER PO) Take 1 tablet by mouth in the morning. [employer] Bone Builder Forte   Yes [provider]  omeprazole (PRILOSEC) 20 MG capsule Take 20 mg by mouth in the morning.   Yes  [provider]  oxyCODONE-acetaminophen (PERCOCET) 5-325 MG tablet Take 1 tablet by mouth every 4 (four) hours as needed for severe pain (May cause constipation). 04/29/22  Yes Janiel Crisostomo, MD  potassium chloride SA (KLOR-CON M) 10 MEQ tablet Take 2 tablets (20 mEq total) by mouth 2 (two) times daily for 7 days. 04/29/22 05/06/22 Yes Estiben Mizuno, MD  mirabegron ER (MYRBETRIQ) 50 MG TB24 tablet Take 1 tablet (50 mg total) by mouth daily. 10/03/21 11/02/21  Gareth Morgan, MD    Allergies Patient has no known allergies.   REVIEW OF SYSTEMS  Negative except as noted here or in the History of Present  Illness.   PHYSICAL EXAMINATION  Initial Vital Signs Blood pressure (!) 166/89, pulse (!) 112, temperature 98 F (36.7 C), resp. rate 16, SpO2 95 %.  Examination General: Well-developed, well-nourished female in no acute distress; appearance consistent with age of record HENT: normocephalic; atraumatic Eyes: Normal appearance Neck: supple Heart: regular rate and rhythm Lungs: clear to auscultation bilaterally Chest: Port-A-Cath Abdomen: soft; nondistended; nontender; bowel sounds present Extremities: No deformity; tenderness and swelling of left ankle; edema and warmth of left lower leg but no erythema Neurologic: Awake, alert and oriented; motor function intact in all extremities and symmetric; no facial droop Skin: Warm and dry Psychiatric: Normal mood and affect   RESULTS  Summary of this visit's results, reviewed and interpreted by myself:   EKG Interpretation  Date/Time:    Ventricular Rate:    PR Interval:    QRS Duration:   QT Interval:    QTC Calculation:   R Axis:     Text Interpretation:         Laboratory Studies: Results for orders placed or performed during the hospital encounter of 04/28/22 (from the past 24 hour(s))  CBC with Differential     Status: Abnormal   Collection Time: 04/29/22 12:11 AM  Result Value Ref Range   WBC 8.4 4.0 - 10.5 K/uL   RBC 3.48 (L) 3.87 - 5.11 MIL/uL   Hemoglobin 10.5 (L) 12.0 - 15.0 g/dL   HCT 32.0 (L) 36.0 - 46.0 %   MCV 92.0 80.0 - 100.0 fL   MCH 30.2 26.0 - 34.0 pg   MCHC 32.8 30.0 - 36.0 g/dL   RDW 12.5 11.5 - 15.5 %   Platelets 281 150 - 400 K/uL   nRBC 0.0 0.0 - 0.2 %   Neutrophils Relative % 57 %   Neutro Abs 4.9 1.7 - 7.7 K/uL   Lymphocytes Relative 32 %   Lymphs Abs 2.6 0.7 - 4.0 K/uL   Monocytes Relative 9 %   Monocytes Absolute 0.7 0.1 - 1.0 K/uL   Eosinophils Relative 1 %   Eosinophils Absolute 0.1 0.0 - 0.5 K/uL   Basophils Relative 1 %   Basophils Absolute 0.0 0.0 - 0.1 K/uL   Immature  Granulocytes 0 %   Abs Immature Granulocytes 0.02 0.00 - 0.07 K/uL  Urinalysis, Routine w reflex microscopic Urine, Clean Catch     Status: Abnormal   Collection Time: 04/29/22 12:11 AM  Result Value Ref Range   Color, Urine RED (A) YELLOW   APPearance CLOUDY (A) CLEAR   Specific Gravity, Urine 1.014 1.005 - 1.030   pH 6.0 5.0 - 8.0   Glucose, UA NEGATIVE NEGATIVE mg/dL   Hgb urine dipstick LARGE (A) NEGATIVE   Bilirubin Urine NEGATIVE NEGATIVE   Ketones, ur NEGATIVE NEGATIVE mg/dL   Protein, ur 100 (A) NEGATIVE mg/dL   Nitrite  POSITIVE (A) NEGATIVE   Leukocytes,Ua LARGE (A) NEGATIVE   RBC / HPF >50 (H) 0 - 5 RBC/hpf   WBC, UA >50 (H) 0 - 5 WBC/hpf   Bacteria, UA RARE (A) NONE SEEN   Squamous Epithelial / HPF 0-5 0 - 5 /HPF   WBC Clumps PRESENT   D-dimer, quantitative     Status: Abnormal   Collection Time: 04/29/22 12:11 AM  Result Value Ref Range   D-Dimer, Quant 1.63 (H) 0.00 - 0.50 ug/mL-FEU  Basic metabolic panel     Status: Abnormal   Collection Time: 04/29/22  3:47 AM  Result Value Ref Range   Sodium 133 (L) 135 - 145 mmol/L   Potassium 2.8 (L) 3.5 - 5.1 mmol/L   Chloride 94 (L) 98 - 111 mmol/L   CO2 31 22 - 32 mmol/L   Glucose, Bld 157 (H) 70 - 99 mg/dL   BUN 22 8 - 23 mg/dL   Creatinine, Ser 0.98 0.44 - 1.00 mg/dL   Calcium 9.7 8.9 - 10.3 mg/dL   GFR, Estimated 56 (L) >60 mL/min   Anion gap 8 5 - 15   Imaging Studies: CT ABDOMEN PELVIS W CONTRAST  Result Date: 04/29/2022 CLINICAL DATA:  Bladder cancer. Nausea and vomiting. * Tracking Code: BO *. EXAM: CT ABDOMEN AND PELVIS WITH CONTRAST TECHNIQUE: Multidetector CT imaging of the abdomen and pelvis was performed using the standard protocol following bolus administration of intravenous contrast. RADIATION DOSE REDUCTION: This exam was performed according to the departmental dose-optimization program which includes automated exposure control, adjustment of the mA and/or kV according to patient size and/or use of  iterative reconstruction technique. CONTRAST:  154m OMNIPAQUE IOHEXOL 300 MG/ML  SOLN COMPARISON:  03/01/2022 FINDINGS: Lower chest: The patchy and nodular airspace disease seen in the right middle lobe previously appears decreased in the interval although incompletely visualized today. Dominant nodular component medially measured previously at 13 mm is 10 mm today on image 2/series 6. Hepatobiliary: No suspicious focal abnormality within the liver parenchyma. Tiny calcified gallstone evident. No intrahepatic or extrahepatic biliary dilation. Pancreas: No focal mass lesion. No dilatation of the main duct. No intraparenchymal cyst. No peripancreatic edema. Spleen: No splenomegaly. No focal mass lesion. Adrenals/Urinary Tract: No adrenal nodule or mass. Kidneys unremarkable. No evidence for hydroureter. Bladder is nondistended with irregular/nodular anterior wall thickening that enhances diffusely. Patient also has a focus of nodular enhancement in the base of the bladder near and likely involving the urethral orifice (see axial 74/2 and sagittal 86/5). These imaging features compatible with the reported history of bladder cancer. Gas is visible within the bladder lumen. Patient also appears to have a defect in the posterior bladder wall which is contiguous with the vagina and fluid is visible in the lumen of the vagina (see axial image 66 of series 2). 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 Stomach/Bowel: Moderate hiatal hernia. Stomach otherwise unremarkable. Duodenum is normally positioned as is the ligament of Treitz. No small bowel wall thickening. No small bowel dilatation. The terminal ileum is normal. The appendix is not well visualized, but there is no edema or inflammation in the region of the cecum. No gross colonic mass. No colonic wall thickening. Diverticular changes are noted in the left colon without evidence of diverticulitis. Vascular/Lymphatic:  There is moderate atherosclerotic calcification of the abdominal aorta without aneurysm. There is no gastrohepatic or hepatoduodenal ligament lymphadenopathy. No retroperitoneal or mesenteric lymphadenopathy. No pelvic sidewall  lymphadenopathy. Reproductive: Uterus surgically absent.  There is no adnexal mass. Other: No intraperitoneal free fluid. Musculoskeletal: No worrisome lytic or sclerotic osseous abnormality. IMPRESSION: 1. Bladder is nondistended with irregular/nodular anterior wall thickening that enhances diffusely. Patient also has a separate focus of nodular enhancement in the base of the bladder near and likely involving the urethral orifice. These imaging features compatible with the reported history of bladder cancer. 2. Patient also appears to have a defect in the posterior bladder wall which is contiguous with the vagina and fluid is visible in the lumen of the adjacent vagina. This appearance certainly raises concern for vesico vaginal fistula. 3. Gas is visible in the bladder lumen. This could be secondary to recent instrumentation although vesico vaginal fistula could also account for this finding. 4. The patchy and nodular airspace disease seen in the right middle lobe previously appears decreased in the interval although incompletely visualized today. Dominant nodular component medially measured previously at 13 mm is 10 mm today. 5. Moderate hiatal hernia. 6. Cholelithiasis. 7. Left colonic diverticulosis without diverticulitis. 8.  Aortic Atherosclerosis (ICD10-I70.0). Electronically Signed   By: Misty Stanley M.D.   On: 04/29/2022 05:57   DG Ankle Complete Left  Result Date: 04/29/2022 CLINICAL DATA:  Left ankle pain and swelling. EXAM: LEFT ANKLE COMPLETE - 3+ VIEW COMPARISON:  None Available. FINDINGS: Diffusely sclerotic osseous structures are seen. A nondisplaced fracture deformity is seen involving the left medial malleolus. There is no evidence of dislocation. Moderate severity  diffuse soft tissue swelling is seen. IMPRESSION: 1. Nondisplaced fracture of the left medial malleolus. 2. Diffusely sclerotic osseous structures which may be secondary to underlying osseous metastasis. Electronically Signed   By: Virgina Norfolk M.D.   On: 04/29/2022 00:06    ED COURSE and MDM  Nursing notes, initial and subsequent vitals signs, including pulse oximetry, reviewed and interpreted by myself.  Vitals:   04/29/22 0400 04/29/22 0535 04/29/22 0600 04/29/22 0611  BP: (!) 170/74 (!) 171/77 (!) 163/83   Pulse: 82 87 89   Resp: '17 16 16   '$ Temp:    98.2 F (36.8 C)  TempSrc:    Oral  SpO2: 91% 96% 95%    Medications  fentaNYL (SUBLIMAZE) injection 50 mcg (50 mcg Intravenous Given 04/29/22 0014)  fentaNYL (SUBLIMAZE) injection 50 mcg (50 mcg Intravenous Given 04/29/22 0109)  ciprofloxacin (CIPRO) IVPB 400 mg (0 mg Intravenous Stopped 04/29/22 0411)  fentaNYL (SUBLIMAZE) injection 50 mcg (50 mcg Intravenous Given 04/29/22 0304)  potassium chloride SA (KLOR-CON M) CR tablet 40 mEq (40 mEq Oral Given 04/29/22 0417)  iohexol (OMNIPAQUE) 300 MG/ML solution 100 mL (100 mLs Intravenous Contrast Given 04/29/22 0428)   1:04 AM Radiograph of the left ankle shows a fracture of the medial malleolus.  It also shows sclerotic osseous structures which could be metastases.  The patient was not aware of having any metastases and the most recent CT scan on 03/01/2022 showed no bony metastases in the chest abdomen or pelvis.  Will have the orthopedic technician placed the patient's left ankle in a splint.  She is already nonweightbearing at home, getting around with a wheelchair.   1:44 AM Splint placed by orthopedic technician.  Toes remain neurovascularly intact.  2:29 AM Urinalysis comes distant with a urinary tract infection.  Urine has been sent for culture.  We will start IV Cipro and send her home on oral ciprofloxacin.  Because of the sclerotic lesion seen on her ankle x-ray we will obtain a CT  of her  abdomen and pelvis and evaluate for potential spread of her cancer.  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  She states that this is not surprising as she has had some vaginal bleeding.  Dr. Tresa Moore will likely deal with this when he performs a cystectomy as scheduled later this month.  Her D-dimer is elevated and she has had persistent swelling of the left lower extremity.  We will obtain Doppler ultrasounds to evaluate for DVT.  PROCEDURES  Procedures   ED DIAGNOSES     ICD-10-CM   1. Bladder infection  N30.90     2. 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 Hypokalemia  E87.6     4. Closed nondisplaced fracture of medial malleolus of left tibia, initial encounter  S82.55XA          Rosalynn Sergent, Jenny Reichmann, MD 04/29/22 [id number]

## 2022-04-29 ENCOUNTER — Emergency Department (HOSPITAL_COMMUNITY): Payer: Medicare HMO

## 2022-04-29 ENCOUNTER — Encounter (HOSPITAL_COMMUNITY): Payer: Self-pay

## 2022-04-29 ENCOUNTER — Ambulatory Visit (HOSPITAL_COMMUNITY): Payer: Medicare HMO

## 2022-04-29 DIAGNOSIS — Z743 Need for continuous supervision: Secondary | ICD-10-CM | POA: Diagnosis not present

## 2022-04-29 DIAGNOSIS — K8020 Calculus of gallbladder without cholecystitis without obstruction: Secondary | ICD-10-CM | POA: Diagnosis not present

## 2022-04-29 DIAGNOSIS — K5730 Diverticulosis of large intestine without perforation or abscess without bleeding: Secondary | ICD-10-CM | POA: Diagnosis not present

## 2022-04-29 DIAGNOSIS — M79606 Pain in leg, unspecified: Secondary | ICD-10-CM | POA: Diagnosis not present

## 2022-04-29 DIAGNOSIS — C679 Malignant neoplasm of bladder, unspecified: Secondary | ICD-10-CM | POA: Diagnosis not present

## 2022-04-29 LAB — CBC WITH DIFFERENTIAL/PLATELET
Abs Immature Granulocytes: 0.02 10*3/uL (ref 0.00–0.07)
Basophils Absolute: 0 10*3/uL (ref 0.0–0.1)
Basophils Relative: 1 %
Eosinophils Absolute: 0.1 10*3/uL (ref 0.0–0.5)
Eosinophils Relative: 1 %
HCT: 32 % — ABNORMAL LOW (ref 36.0–46.0)
Hemoglobin: 10.5 g/dL — ABNORMAL LOW (ref 12.0–15.0)
Immature Granulocytes: 0 %
Lymphocytes Relative: 32 %
Lymphs Abs: 2.6 10*3/uL (ref 0.7–4.0)
MCH: 30.2 pg (ref 26.0–34.0)
MCHC: 32.8 g/dL (ref 30.0–36.0)
MCV: 92 fL (ref 80.0–100.0)
Monocytes Absolute: 0.7 10*3/uL (ref 0.1–1.0)
Monocytes Relative: 9 %
Neutro Abs: 4.9 10*3/uL (ref 1.7–7.7)
Neutrophils Relative %: 57 %
Platelets: 281 10*3/uL (ref 150–400)
RBC: 3.48 MIL/uL — ABNORMAL LOW (ref 3.87–5.11)
RDW: 12.5 % (ref 11.5–15.5)
WBC: 8.4 10*3/uL (ref 4.0–10.5)
nRBC: 0 % (ref 0.0–0.2)

## 2022-04-29 LAB — URINALYSIS, ROUTINE W REFLEX MICROSCOPIC
Bilirubin Urine: NEGATIVE
Glucose, UA: NEGATIVE mg/dL
Ketones, ur: NEGATIVE mg/dL
Nitrite: POSITIVE — AB
Protein, ur: 100 mg/dL — AB
RBC / HPF: >50 RBC/hpf — ABNORMAL HIGH (ref 0–5)
Specific Gravity, Urine: 1.014 (ref 1.005–1.030)
WBC, UA: >50 WBC/hpf — ABNORMAL HIGH (ref 0–5)
pH: 6 (ref 5.0–8.0)

## 2022-04-29 LAB — BASIC METABOLIC PANEL
Anion gap: 8 (ref 5–15)
BUN: 22 mg/dL (ref 8–23)
CO2: 31 mmol/L (ref 22–32)
Calcium: 9.7 mg/dL (ref 8.9–10.3)
Chloride: 94 mmol/L — ABNORMAL LOW (ref 98–111)
Creatinine, Ser: 0.98 mg/dL (ref 0.44–1.00)
GFR, Estimated: 56 mL/min — ABNORMAL LOW (ref >60)
Glucose, Bld: 157 mg/dL — ABNORMAL HIGH (ref 70–99)
Potassium: 2.8 mmol/L — ABNORMAL LOW (ref 3.5–5.1)
Sodium: 133 mmol/L — ABNORMAL LOW (ref 135–145)

## 2022-04-29 LAB — D-DIMER, QUANTITATIVE: D-Dimer, Quant: 1.63 ug/mL-FEU — ABNORMAL HIGH (ref 0.00–0.50)

## 2022-04-29 MED ORDER — OXYCODONE-ACETAMINOPHEN 5-325 MG PO TABS: 1.0000 | ORAL_TABLET | ORAL | 0 refills | Status: DC | PRN

## 2022-04-29 MED ORDER — IOHEXOL 300 MG/ML  SOLN
100.0000 mL | Freq: Once | INTRAMUSCULAR | Status: AC | PRN
  Administered 2022-04-29: 100 mL via INTRAVENOUS

## 2022-04-29 MED ORDER — POTASSIUM CHLORIDE CRYS ER 20 MEQ PO TBCR
40.0000 meq | EXTENDED_RELEASE_TABLET | Freq: Once | ORAL | Status: AC
  Administered 2022-04-29: 40 meq via ORAL
  Filled 2022-04-29: qty 2

## 2022-04-29 MED ORDER — OXYCODONE-ACETAMINOPHEN 5-325 MG PO TABS
1.0000 | ORAL_TABLET | Freq: Once | ORAL | Status: AC
  Administered 2022-04-29: 1 via ORAL
  Filled 2022-04-29: qty 1

## 2022-04-29 MED ORDER — FENTANYL CITRATE PF 50 MCG/ML IJ SOSY
50.0000 ug | PREFILLED_SYRINGE | Freq: Once | INTRAMUSCULAR | Status: AC
  Administered 2022-04-29: 50 ug via INTRAVENOUS
  Filled 2022-04-29: qty 1

## 2022-04-29 MED ORDER — CIPROFLOXACIN HCL 500 MG PO TABS: 500.0000 mg | ORAL_TABLET | Freq: Two times a day (BID) | ORAL | 0 refills | Status: DC

## 2022-04-29 MED ORDER — POTASSIUM CHLORIDE CRYS ER 10 MEQ PO TBCR: 20.0000 meq | EXTENDED_RELEASE_TABLET | Freq: Two times a day (BID) | ORAL | 0 refills | Status: DC

## 2022-04-29 MED ORDER — CIPROFLOXACIN IN D5W 400 MG/200ML IV SOLN
400.0000 mg | Freq: Once | INTRAVENOUS | Status: AC
  Administered 2022-04-29: 400 mg via INTRAVENOUS
  Filled 2022-04-29: qty 200

## 2022-04-29 MED ORDER — HEPARIN SOD (PORK) LOCK FLUSH 100 UNIT/ML IV SOLN
500.0000 [IU] | Freq: Once | INTRAVENOUS | Status: AC
  Administered 2022-04-29: 500 [IU]
  Filled 2022-04-29: qty 5

## 2022-04-29 NOTE — Progress Notes (Signed)
Orthopedic Tech Progress Note Patient Details:  Brandy Wheeler Jan 12, 1932 [id number]  Ortho Devices Type of Ortho Device: Post (short leg) splint, Stirrup splint Ortho Device/Splint Location: lle Ortho Device/Splint Interventions: Ordered, Application, Adjustment   Post Interventions Patient Tolerated: Well Instructions Provided: Care of device, Adjustment of device  [name] 04/29/2022, 1:51 AM

## 2022-04-30 ENCOUNTER — Ambulatory Visit (HOSPITAL_COMMUNITY)
Admission: RE | Admit: 2022-04-30 | Discharge: 2022-04-30 | Disposition: A | Discharge status: Home / Self Care | Payer: Medicare HMO | Source: Ambulatory Visit | Attending: Emergency Medicine | Admitting: Emergency Medicine

## 2022-04-30 DIAGNOSIS — Z8551 Personal history of malignant neoplasm of bladder: Secondary | ICD-10-CM | POA: Diagnosis not present

## 2022-04-30 DIAGNOSIS — Z719 Counseling, unspecified: Secondary | ICD-10-CM | POA: Insufficient documentation

## 2022-04-30 DIAGNOSIS — M7989 Other specified soft tissue disorders: Secondary | ICD-10-CM

## 2022-04-30 NOTE — Progress Notes (Signed)
VASCULAR LAB    Bilateral lower extremity venous duplex has been performed.  See CV proc for preliminary results.   Susannah Carbin, RVT 04/30/2022, 11:34 AM

## 2022-05-01 LAB — URINE CULTURE: Culture: >=100000 — AB

## 2022-05-02 ENCOUNTER — Telehealth (HOSPITAL_BASED_OUTPATIENT_CLINIC_OR_DEPARTMENT_OTHER): Payer: Self-pay | Admitting: Emergency Medicine

## 2022-05-02 NOTE — Telephone Encounter (Signed)
Post ED Visit - Positive Culture Follow-up  Culture report reviewed by antimicrobial stewardship pharmacist: [place] Team '[]'$  Elenor Quinones, Pharm.D. '[x]'$  Heide Guile, Pharm.D., BCPS AQ-ID '[]'$  Parks Neptune, Pharm.D., BCPS '[]'$  Alycia Rossetti, Pharm.D., BCPS '[]'$  [place], Pharm.D., BCPS, AAHIVP '[]'$  Legrand Como, Pharm.D., BCPS, AAHIVP '[]'$  Salome Arnt, PharmD, BCPS '[]'$  Johnnette Gourd, PharmD, BCPS '[]'$  Hughes Better, PharmD, BCPS '[]'$  Leeroy Cha, PharmD '[]'$  Laqueta Linden, PharmD, BCPS '[]'$  Albertina Parr, PharmD  [place] Team '[]'$  Leodis Sias, PharmD '[]'$  Lindell Spar, PharmD '[]'$  Royetta Asal, PharmD '[]'$  Graylin Shiver, Rph '[]'$  Rema Fendt) Glennon Mac, PharmD '[]'$  Arlyn Dunning, PharmD '[]'$  Netta Cedars, PharmD '[]'$  Dia Sitter, PharmD '[]'$  Leone Haven, PharmD '[]'$  Gretta Arab, PharmD '[]'$  Theodis Shove, PharmD '[]'$  Peggyann Juba, PharmD '[]'$  Reuel Boom, PharmD   Positive urine culture Treated with ciprofloxacin, organism sensitive to the same and no further patient follow-up is required at this time.  Hazle Nordmann 05/02/2022, 10:00 AM

## 2022-05-03 NOTE — ED Notes (Signed)
Opened chart to provide pt with information for Ortho referral at her request.

## 2022-05-07 DIAGNOSIS — M25572 Pain in left ankle and joints of left foot: Secondary | ICD-10-CM | POA: Diagnosis not present

## 2022-05-08 ENCOUNTER — Encounter (HOSPITAL_COMMUNITY): Payer: Self-pay | Admitting: Urology

## 2022-05-08 ENCOUNTER — Other Ambulatory Visit: Payer: Self-pay

## 2022-05-08 ENCOUNTER — Inpatient Hospital Stay (HOSPITAL_COMMUNITY)
Admission: RE | Admit: 2022-05-08 | Discharge: 2022-05-17 | DRG: 654 | Disposition: A | Discharge status: Skilled Nursing Facility | Payer: Medicare HMO | Source: Ambulatory Visit | Attending: Urology | Admitting: Urology

## 2022-05-08 DIAGNOSIS — C775 Secondary and unspecified malignant neoplasm of intrapelvic lymph nodes: Secondary | ICD-10-CM | POA: Diagnosis not present

## 2022-05-08 DIAGNOSIS — E440 Moderate protein-calorie malnutrition: Secondary | ICD-10-CM | POA: Diagnosis not present

## 2022-05-08 DIAGNOSIS — Z79899 Other long term (current) drug therapy: Secondary | ICD-10-CM | POA: Diagnosis not present

## 2022-05-08 DIAGNOSIS — E876 Hypokalemia: Secondary | ICD-10-CM | POA: Diagnosis not present

## 2022-05-08 DIAGNOSIS — D638 Anemia in other chronic diseases classified elsewhere: Secondary | ICD-10-CM | POA: Diagnosis present

## 2022-05-08 DIAGNOSIS — Z6821 Body mass index (BMI) 21.0-21.9, adult: Secondary | ICD-10-CM | POA: Diagnosis not present

## 2022-05-08 DIAGNOSIS — N820 Vesicovaginal fistula: Secondary | ICD-10-CM | POA: Diagnosis not present

## 2022-05-08 DIAGNOSIS — C7989 Secondary malignant neoplasm of other specified sites: Secondary | ICD-10-CM | POA: Diagnosis not present

## 2022-05-08 DIAGNOSIS — K567 Ileus, unspecified: Secondary | ICD-10-CM | POA: Diagnosis not present

## 2022-05-08 DIAGNOSIS — K219 Gastro-esophageal reflux disease without esophagitis: Secondary | ICD-10-CM | POA: Diagnosis not present

## 2022-05-08 DIAGNOSIS — N179 Acute kidney failure, unspecified: Secondary | ICD-10-CM | POA: Diagnosis not present

## 2022-05-08 DIAGNOSIS — N1339 Other hydronephrosis: Secondary | ICD-10-CM | POA: Diagnosis present

## 2022-05-08 DIAGNOSIS — G893 Neoplasm related pain (acute) (chronic): Secondary | ICD-10-CM | POA: Diagnosis present

## 2022-05-08 DIAGNOSIS — C679 Malignant neoplasm of bladder, unspecified: Secondary | ICD-10-CM | POA: Diagnosis not present

## 2022-05-08 DIAGNOSIS — I10 Essential (primary) hypertension: Secondary | ICD-10-CM | POA: Diagnosis present

## 2022-05-08 DIAGNOSIS — C50919 Malignant neoplasm of unspecified site of unspecified female breast: Secondary | ICD-10-CM | POA: Diagnosis not present

## 2022-05-08 DIAGNOSIS — C7919 Secondary malignant neoplasm of other urinary organs: Secondary | ICD-10-CM | POA: Diagnosis not present

## 2022-05-08 DIAGNOSIS — C7982 Secondary malignant neoplasm of genital organs: Secondary | ICD-10-CM | POA: Diagnosis not present

## 2022-05-08 DIAGNOSIS — N2889 Other specified disorders of kidney and ureter: Secondary | ICD-10-CM | POA: Diagnosis not present

## 2022-05-08 DIAGNOSIS — Z419 Encounter for procedure for purposes other than remedying health state, unspecified: Principal | ICD-10-CM

## 2022-05-08 MED ORDER — SODIUM CHLORIDE 0.9 % IV SOLN: INTRAVENOUS | Status: DC

## 2022-05-08 MED ORDER — ALVIMOPAN 12 MG PO CAPS
12.0000 mg | ORAL_CAPSULE | ORAL | Status: AC
  Administered 2022-05-09: 12 mg via ORAL
  Filled 2022-05-08: qty 1

## 2022-05-08 MED ORDER — METRONIDAZOLE 500 MG PO TABS
500.0000 mg | ORAL_TABLET | ORAL | Status: AC
  Administered 2022-05-08 – 2022-05-09 (×2): 500 mg via ORAL
  Filled 2022-05-08 (×2): qty 1

## 2022-05-08 MED ORDER — OXYCODONE-ACETAMINOPHEN 5-325 MG PO TABS
1.0000 | ORAL_TABLET | ORAL | Status: DC | PRN
  Administered 2022-05-09: 1 via ORAL
  Filled 2022-05-08: qty 1

## 2022-05-08 MED ORDER — PEG 3350-KCL-NA BICARB-NACL 420 G PO SOLR
4000.0000 mL | Freq: Once | ORAL | Status: AC
  Administered 2022-05-08: 4000 mL via ORAL

## 2022-05-08 MED ORDER — ONDANSETRON HCL 4 MG/2ML IJ SOLN
4.0000 mg | Freq: Four times a day (QID) | INTRAMUSCULAR | Status: DC | PRN
  Administered 2022-05-08: 4 mg via INTRAVENOUS
  Filled 2022-05-08: qty 2

## 2022-05-08 MED ORDER — NEOMYCIN SULFATE 500 MG PO TABS
500.0000 mg | ORAL_TABLET | ORAL | Status: AC
  Administered 2022-05-08 – 2022-05-09 (×2): 500 mg via ORAL
  Filled 2022-05-08 (×3): qty 1

## 2022-05-08 NOTE — Plan of Care (Signed)
  Problem: Pain Managment: Goal: General experience of comfort will improve Outcome: Progressing   Problem: Safety: Goal: Ability to remain free from injury will improve Outcome: Progressing   Problem: Skin Integrity: Goal: Risk for impaired skin integrity will decrease Outcome: Progressing

## 2022-05-09 ENCOUNTER — Other Ambulatory Visit: Payer: Self-pay

## 2022-05-09 ENCOUNTER — Encounter (HOSPITAL_COMMUNITY)
Admission: RE | Disposition: A | Discharge status: Skilled Nursing Facility | Payer: Self-pay | Source: Ambulatory Visit | Attending: Urology

## 2022-05-09 ENCOUNTER — Inpatient Hospital Stay (HOSPITAL_COMMUNITY): Payer: Medicare HMO | Admitting: Certified Registered Nurse Anesthetist

## 2022-05-09 ENCOUNTER — Encounter (HOSPITAL_COMMUNITY): Payer: Self-pay | Admitting: Urology

## 2022-05-09 DIAGNOSIS — I10 Essential (primary) hypertension: Secondary | ICD-10-CM

## 2022-05-09 DIAGNOSIS — C50919 Malignant neoplasm of unspecified site of unspecified female breast: Secondary | ICD-10-CM

## 2022-05-09 DIAGNOSIS — C679 Malignant neoplasm of bladder, unspecified: Secondary | ICD-10-CM | POA: Diagnosis present

## 2022-05-09 DIAGNOSIS — Z419 Encounter for procedure for purposes other than remedying health state, unspecified: Principal | ICD-10-CM

## 2022-05-09 HISTORY — PX: CYSTOSCOPY WITH INJECTION: SHX1424

## 2022-05-09 HISTORY — PX: ROBOT ASSISTED LAPAROSCOPIC COMPLETE CYSTECT ILEAL CONDUIT: SHX5139

## 2022-05-09 HISTORY — PX: LYMPH NODE DISSECTION: SHX5087

## 2022-05-09 LAB — COMPREHENSIVE METABOLIC PANEL
ALT: 9 U/L (ref 0–44)
AST: 17 U/L (ref 15–41)
Albumin: 2.7 g/dL — ABNORMAL LOW (ref 3.5–5.0)
Alkaline Phosphatase: 82 U/L (ref 38–126)
Anion gap: 11 (ref 5–15)
BUN: 18 mg/dL (ref 8–23)
CO2: 29 mmol/L (ref 22–32)
Calcium: 10.3 mg/dL (ref 8.9–10.3)
Chloride: 93 mmol/L — ABNORMAL LOW (ref 98–111)
Creatinine, Ser: 1.16 mg/dL — ABNORMAL HIGH (ref 0.44–1.00)
GFR, Estimated: 45 mL/min — ABNORMAL LOW (ref >60)
Glucose, Bld: 112 mg/dL — ABNORMAL HIGH (ref 70–99)
Potassium: 3.6 mmol/L (ref 3.5–5.1)
Sodium: 133 mmol/L — ABNORMAL LOW (ref 135–145)
Total Bilirubin: 0.5 mg/dL (ref 0.3–1.2)
Total Protein: 5.7 g/dL — ABNORMAL LOW (ref 6.5–8.1)

## 2022-05-09 LAB — CBC
HCT: 30.3 % — ABNORMAL LOW (ref 36.0–46.0)
Hemoglobin: 9.6 g/dL — ABNORMAL LOW (ref 12.0–15.0)
MCH: 29.1 pg (ref 26.0–34.0)
MCHC: 31.7 g/dL (ref 30.0–36.0)
MCV: 91.8 fL (ref 80.0–100.0)
Platelets: 296 10*3/uL (ref 150–400)
RBC: 3.3 MIL/uL — ABNORMAL LOW (ref 3.87–5.11)
RDW: 12.8 % (ref 11.5–15.5)
WBC: 9.3 10*3/uL (ref 4.0–10.5)
nRBC: 0 % (ref 0.0–0.2)

## 2022-05-09 LAB — ABO/RH: ABO/RH(D): A POS

## 2022-05-09 LAB — HEMOGLOBIN AND HEMATOCRIT, BLOOD
HCT: 27.6 % — ABNORMAL LOW (ref 36.0–46.0)
Hemoglobin: 9 g/dL — ABNORMAL LOW (ref 12.0–15.0)

## 2022-05-09 LAB — SURGICAL PCR SCREEN
MRSA, PCR: NEGATIVE
Staphylococcus aureus: NEGATIVE

## 2022-05-09 SURGERY — CYSTECTOMY, ROBOT-ASSISTED, WITH ILEAL CONDUIT CREATION
Anesthesia: General

## 2022-05-09 MED ORDER — HYDROMORPHONE HCL 1 MG/ML IJ SOLN
INTRAMUSCULAR | Status: DC | PRN
  Administered 2022-05-09 (×2): .4 mg via INTRAVENOUS
  Administered 2022-05-09: .2 mg via INTRAVENOUS

## 2022-05-09 MED ORDER — LACTATED RINGERS IR SOLN
Status: DC | PRN
  Administered 2022-05-09: 1000 mL

## 2022-05-09 MED ORDER — DIPHENHYDRAMINE HCL 12.5 MG/5ML PO ELIX: 12.5000 mg | ORAL_SOLUTION | Freq: Four times a day (QID) | ORAL | Status: DC | PRN

## 2022-05-09 MED ORDER — ONDANSETRON HCL 4 MG/2ML IJ SOLN
INTRAMUSCULAR | Status: DC | PRN
  Administered 2022-05-09 (×2): 4 mg via INTRAVENOUS

## 2022-05-09 MED ORDER — LACTATED RINGERS IV SOLN: INTRAVENOUS | Status: DC | PRN

## 2022-05-09 MED ORDER — DEXAMETHASONE SODIUM PHOSPHATE 10 MG/ML IJ SOLN
INTRAMUSCULAR | Status: AC
  Filled 2022-05-09: qty 1

## 2022-05-09 MED ORDER — OXYCODONE HCL 5 MG PO TABS: 5.0000 mg | ORAL_TABLET | Freq: Once | ORAL | Status: DC | PRN

## 2022-05-09 MED ORDER — SODIUM CHLORIDE 0.9 % IR SOLN
Status: DC | PRN
  Administered 2022-05-09: 1000 mL via INTRAVESICAL

## 2022-05-09 MED ORDER — ROCURONIUM BROMIDE 10 MG/ML (PF) SYRINGE
PREFILLED_SYRINGE | INTRAVENOUS | Status: AC
  Filled 2022-05-09: qty 10

## 2022-05-09 MED ORDER — CHLORHEXIDINE GLUCONATE 0.12 % MT SOLN
15.0000 mL | Freq: Once | OROMUCOSAL | Status: AC
  Administered 2022-05-09: 15 mL via OROMUCOSAL

## 2022-05-09 MED ORDER — STERILE WATER FOR INJECTION IJ SOLN
INTRAMUSCULAR | Status: DC | PRN
  Administered 2022-05-09: 20 mL

## 2022-05-09 MED ORDER — BUPIVACAINE LIPOSOME 1.3 % IJ SUSP
INTRAMUSCULAR | Status: AC
  Filled 2022-05-09: qty 20

## 2022-05-09 MED ORDER — PROPOFOL 10 MG/ML IV BOLUS
INTRAVENOUS | Status: AC
  Filled 2022-05-09: qty 20

## 2022-05-09 MED ORDER — ALVIMOPAN 12 MG PO CAPS
12.0000 mg | ORAL_CAPSULE | Freq: Two times a day (BID) | ORAL | Status: DC
  Administered 2022-05-10 – 2022-05-11 (×4): 12 mg via ORAL
  Filled 2022-05-09 (×4): qty 1

## 2022-05-09 MED ORDER — ALBUMIN HUMAN 5 % IV SOLN: INTRAVENOUS | Status: DC | PRN

## 2022-05-09 MED ORDER — HYDROMORPHONE HCL 2 MG/ML IJ SOLN
INTRAMUSCULAR | Status: AC
  Filled 2022-05-09: qty 1

## 2022-05-09 MED ORDER — PHENYLEPHRINE 80 MCG/ML (10ML) SYRINGE FOR IV PUSH (FOR BLOOD PRESSURE SUPPORT)
PREFILLED_SYRINGE | INTRAVENOUS | Status: DC | PRN
  Administered 2022-05-09: 80 ug via INTRAVENOUS
  Administered 2022-05-09: 160 ug via INTRAVENOUS
  Administered 2022-05-09: 80 ug via INTRAVENOUS

## 2022-05-09 MED ORDER — WATER FOR IRRIGATION, STERILE IR SOLN
Status: DC | PRN
  Administered 2022-05-09: 1000 mL

## 2022-05-09 MED ORDER — ONDANSETRON HCL 4 MG/2ML IJ SOLN
INTRAMUSCULAR | Status: AC
  Filled 2022-05-09: qty 2

## 2022-05-09 MED ORDER — OXYCODONE HCL 5 MG PO TABS
5.0000 mg | ORAL_TABLET | ORAL | Status: DC | PRN
  Administered 2022-05-09 – 2022-05-17 (×9): 5 mg via ORAL
  Filled 2022-05-09 (×9): qty 1

## 2022-05-09 MED ORDER — 0.9 % SODIUM CHLORIDE (POUR BTL) OPTIME
TOPICAL | Status: DC | PRN
  Administered 2022-05-09: 1000 mL

## 2022-05-09 MED ORDER — LIDOCAINE HCL (PF) 2 % IJ SOLN
INTRAMUSCULAR | Status: AC
  Filled 2022-05-09: qty 5

## 2022-05-09 MED ORDER — FENTANYL CITRATE (PF) 100 MCG/2ML IJ SOLN
INTRAMUSCULAR | Status: DC | PRN
  Administered 2022-05-09 (×3): 25 ug via INTRAVENOUS
  Administered 2022-05-09: 100 ug via INTRAVENOUS
  Administered 2022-05-09: 50 ug via INTRAVENOUS
  Administered 2022-05-09: 25 ug via INTRAVENOUS

## 2022-05-09 MED ORDER — STERILE WATER FOR INJECTION IJ SOLN
INTRAMUSCULAR | Status: DC | PRN
  Administered 2022-05-09: 4 mL via INTRAMUSCULAR

## 2022-05-09 MED ORDER — FENTANYL CITRATE (PF) 250 MCG/5ML IJ SOLN
INTRAMUSCULAR | Status: AC
  Filled 2022-05-09: qty 5

## 2022-05-09 MED ORDER — ALBUMIN HUMAN 5 % IV SOLN
INTRAVENOUS | Status: AC
  Filled 2022-05-09: qty 250

## 2022-05-09 MED ORDER — ACETAMINOPHEN 10 MG/ML IV SOLN
INTRAVENOUS | Status: AC
  Filled 2022-05-09: qty 100

## 2022-05-09 MED ORDER — AMISULPRIDE (ANTIEMETIC) 5 MG/2ML IV SOLN: 10.0000 mg | Freq: Once | INTRAVENOUS | Status: DC | PRN

## 2022-05-09 MED ORDER — LIDOCAINE 2% (20 MG/ML) 5 ML SYRINGE
INTRAMUSCULAR | Status: DC | PRN
  Administered 2022-05-09: 60 mg via INTRAVENOUS

## 2022-05-09 MED ORDER — HYDROMORPHONE HCL 1 MG/ML IJ SOLN
INTRAMUSCULAR | Status: AC
  Filled 2022-05-09: qty 1

## 2022-05-09 MED ORDER — PIPERACILLIN-TAZOBACTAM 3.375 G IVPB
INTRAVENOUS | Status: AC
  Filled 2022-05-09: qty 50

## 2022-05-09 MED ORDER — SENNOSIDES-DOCUSATE SODIUM 8.6-50 MG PO TABS
2.0000 | ORAL_TABLET | Freq: Every day | ORAL | Status: DC
  Administered 2022-05-10 – 2022-05-16 (×7): 2 via ORAL
  Filled 2022-05-09 (×7): qty 2

## 2022-05-09 MED ORDER — DEXAMETHASONE SODIUM PHOSPHATE 10 MG/ML IJ SOLN
INTRAMUSCULAR | Status: DC | PRN
  Administered 2022-05-09: 10 mg via INTRAVENOUS

## 2022-05-09 MED ORDER — SODIUM CHLORIDE (PF) 0.9 % IJ SOLN
INTRAMUSCULAR | Status: AC
  Filled 2022-05-09: qty 20

## 2022-05-09 MED ORDER — ARTIFICIAL TEARS OPHTHALMIC OINT
TOPICAL_OINTMENT | OPHTHALMIC | Status: AC
  Filled 2022-05-09: qty 3.5

## 2022-05-09 MED ORDER — SODIUM CHLORIDE 0.45 % IV SOLN: INTRAVENOUS | Status: DC

## 2022-05-09 MED ORDER — LACTATED RINGERS IV SOLN: INTRAVENOUS | Status: DC

## 2022-05-09 MED ORDER — DIPHENHYDRAMINE HCL 50 MG/ML IJ SOLN: 12.5000 mg | Freq: Four times a day (QID) | INTRAMUSCULAR | Status: DC | PRN

## 2022-05-09 MED ORDER — SUGAMMADEX SODIUM 200 MG/2ML IV SOLN
INTRAVENOUS | Status: DC | PRN
  Administered 2022-05-09: 300 mg via INTRAVENOUS

## 2022-05-09 MED ORDER — OXYCODONE HCL 5 MG PO TABS
ORAL_TABLET | ORAL | Status: AC
  Filled 2022-05-09: qty 1

## 2022-05-09 MED ORDER — OXYCODONE HCL 5 MG/5ML PO SOLN: 5.0000 mg | Freq: Once | ORAL | Status: DC | PRN

## 2022-05-09 MED ORDER — KETAMINE HCL 10 MG/ML IJ SOLN
INTRAMUSCULAR | Status: DC | PRN
  Administered 2022-05-09: 20 mg via INTRAVENOUS
  Administered 2022-05-09 (×3): 10 mg via INTRAVENOUS

## 2022-05-09 MED ORDER — SODIUM CHLORIDE (PF) 0.9 % IJ SOLN
INTRAMUSCULAR | Status: AC
  Filled 2022-05-09: qty 10

## 2022-05-09 MED ORDER — KETAMINE HCL 10 MG/ML IJ SOLN
INTRAMUSCULAR | Status: AC
  Filled 2022-05-09: qty 1

## 2022-05-09 MED ORDER — ACETAMINOPHEN 10 MG/ML IV SOLN
1000.0000 mg | Freq: Four times a day (QID) | INTRAVENOUS | Status: AC
  Administered 2022-05-09 – 2022-05-10 (×4): 1000 mg via INTRAVENOUS
  Filled 2022-05-09 (×3): qty 100

## 2022-05-09 MED ORDER — ONDANSETRON HCL 4 MG/2ML IJ SOLN: 4.0000 mg | INTRAMUSCULAR | Status: DC | PRN

## 2022-05-09 MED ORDER — BUPIVACAINE LIPOSOME 1.3 % IJ SUSP
INTRAMUSCULAR | Status: DC | PRN
  Administered 2022-05-09: 20 mL

## 2022-05-09 MED ORDER — ROCURONIUM BROMIDE 10 MG/ML (PF) SYRINGE
PREFILLED_SYRINGE | INTRAVENOUS | Status: DC | PRN
  Administered 2022-05-09: 60 mg via INTRAVENOUS
  Administered 2022-05-09: 30 mg via INTRAVENOUS
  Administered 2022-05-09: 40 mg via INTRAVENOUS

## 2022-05-09 MED ORDER — HYDROMORPHONE HCL 1 MG/ML IJ SOLN
0.5000 mg | INTRAMUSCULAR | Status: DC | PRN
  Administered 2022-05-10: 1 mg via INTRAVENOUS
  Administered 2022-05-10: 0.5 mg via INTRAVENOUS
  Administered 2022-05-11 – 2022-05-16 (×3): 1 mg via INTRAVENOUS
  Filled 2022-05-09 (×6): qty 1

## 2022-05-09 MED ORDER — PHENYLEPHRINE HCL-NACL 20-0.9 MG/250ML-% IV SOLN
INTRAVENOUS | Status: DC | PRN
  Administered 2022-05-09: 35 ug/min via INTRAVENOUS

## 2022-05-09 MED ORDER — PROPOFOL 10 MG/ML IV BOLUS
INTRAVENOUS | Status: DC | PRN
  Administered 2022-05-09: 150 mg via INTRAVENOUS

## 2022-05-09 MED ORDER — PROMETHAZINE HCL 25 MG/ML IJ SOLN: 6.2500 mg | INTRAMUSCULAR | Status: DC | PRN

## 2022-05-09 MED ORDER — PIPERACILLIN-TAZOBACTAM 3.375 G IVPB 30 MIN
3.3750 g | INTRAVENOUS | Status: AC
  Administered 2022-05-09: 3.375 g via INTRAVENOUS
  Filled 2022-05-09: qty 50

## 2022-05-09 MED ORDER — HYDROMORPHONE HCL 1 MG/ML IJ SOLN
0.2500 mg | INTRAMUSCULAR | Status: DC | PRN
  Administered 2022-05-09 (×2): 0.5 mg via INTRAVENOUS

## 2022-05-09 MED ORDER — PIPERACILLIN-TAZOBACTAM 3.375 G IVPB
3.3750 g | Freq: Three times a day (TID) | INTRAVENOUS | Status: AC
  Administered 2022-05-09 – 2022-05-10 (×3): 3.375 g via INTRAVENOUS
  Filled 2022-05-09 (×3): qty 50

## 2022-05-09 SURGICAL SUPPLY — 104 items
APPLICATOR COTTON TIP 6 STRL (MISCELLANEOUS) ×2 IMPLANT
APPLICATOR COTTON TIP 6IN STRL (MISCELLANEOUS) ×2
APPLICATOR SURGIFLO ENDO (HEMOSTASIS) IMPLANT
BAG COUNTER SPONGE SURGICOUNT (BAG) IMPLANT
BAG LAPAROSCOPIC 12 15 PORT 16 (BASKET) ×2 IMPLANT
BAG RETRIEVAL 12/15 (BASKET) ×2
BAG URO CATCHER STRL LF (MISCELLANEOUS) ×2 IMPLANT
BENZOIN TINCTURE PRP APPL 2/3 (GAUZE/BANDAGES/DRESSINGS) IMPLANT
BLADE SURG SZ10 CARB STEEL (BLADE) IMPLANT
CATH SILICONE 5CC 18FR (INSTRUMENTS) ×2 IMPLANT
CELLS DAT CNTRL 66122 CELL SVR (MISCELLANEOUS) ×2 IMPLANT
CHLORAPREP W/TINT 26 (MISCELLANEOUS) ×2 IMPLANT
CLIP LIGATING HEM O LOK PURPLE (MISCELLANEOUS) ×4 IMPLANT
CLIP LIGATING HEMO LOK XL GOLD (MISCELLANEOUS) ×4 IMPLANT
CLIP LIGATING HEMO O LOK GREEN (MISCELLANEOUS) ×2 IMPLANT
CLOTH BEACON ORANGE TIMEOUT ST (SAFETY) ×2 IMPLANT
CNTNR URN SCR LID CUP LEK RST (MISCELLANEOUS) ×2 IMPLANT
CONT SPEC 4OZ STRL OR WHT (MISCELLANEOUS) ×2
COVER SURGICAL LIGHT HANDLE (MISCELLANEOUS) ×2 IMPLANT
COVER TIP SHEARS 8 DVNC (MISCELLANEOUS) ×2 IMPLANT
COVER TIP SHEARS 8MM DA VINCI (MISCELLANEOUS) ×2
DERMABOND ADVANCED .7 DNX12 (GAUZE/BANDAGES/DRESSINGS) ×4 IMPLANT
DRAIN CHANNEL RND F F (WOUND CARE) IMPLANT
DRAIN PENROSE 0.5X18 (DRAIN) IMPLANT
DRAPE ARM DVNC X/XI (DISPOSABLE) ×8 IMPLANT
DRAPE COLUMN DVNC XI (DISPOSABLE) ×2 IMPLANT
DRAPE DA VINCI XI ARM (DISPOSABLE) ×8
DRAPE DA VINCI XI COLUMN (DISPOSABLE) ×2
DRAPE SURG IRRIG POUCH 19X23 (DRAPES) ×2 IMPLANT
DRSG TEGADERM 4X4.75 (GAUZE/BANDAGES/DRESSINGS) IMPLANT
ELECT PENCIL ROCKER SW 15FT (MISCELLANEOUS) ×2 IMPLANT
ELECT REM PT RETURN 15FT ADLT (MISCELLANEOUS) ×2 IMPLANT
GAUZE 4X4 16PLY ~~LOC~~+RFID DBL (SPONGE) IMPLANT
GAUZE PAD ABD 8X10 STRL (GAUZE/BANDAGES/DRESSINGS) IMPLANT
GLOVE BIO SURGEON STRL SZ 6.5 (GLOVE) ×4 IMPLANT
GLOVE BIOGEL PI IND STRL 7.5 (GLOVE) IMPLANT
GLOVE SURG LX STRL 7.5 STRW (GLOVE) ×6 IMPLANT
GOWN SRG XL LVL 4 BRTHBL STRL (GOWNS) ×2 IMPLANT
GOWN STRL NON-REIN XL LVL4 (GOWNS) ×2
GOWN STRL REUS W/ TWL XL LVL3 (GOWN DISPOSABLE) ×6 IMPLANT
GOWN STRL REUS W/TWL XL LVL3 (GOWN DISPOSABLE) ×6
IRRIG SUCT STRYKERFLOW 2 WTIP (MISCELLANEOUS) ×2
IRRIGATION SUCT STRKRFLW 2 WTP (MISCELLANEOUS) ×2 IMPLANT
KIT PROCEDURE DA VINCI SI (MISCELLANEOUS) ×2
KIT PROCEDURE DVNC SI (MISCELLANEOUS) IMPLANT
KIT TURNOVER KIT A (KITS) IMPLANT
LOOP VESSEL MAXI BLUE (MISCELLANEOUS) ×2 IMPLANT
MANIFOLD NEPTUNE II (INSTRUMENTS) ×2 IMPLANT
NDL ASPIRATION 22 (NEEDLE) ×2 IMPLANT
NDL INSUFFLATION 14GA 120MM (NEEDLE) ×2 IMPLANT
NEEDLE ASPIRATION 22 (NEEDLE) ×2 IMPLANT
NEEDLE INSUFFLATION 14GA 120MM (NEEDLE) ×2 IMPLANT
PACK CYSTO (CUSTOM PROCEDURE TRAY) ×2 IMPLANT
PACK ROBOT UROLOGY CUSTOM (CUSTOM PROCEDURE TRAY) ×2 IMPLANT
PAD POSITIONING PINK XL (MISCELLANEOUS) ×2 IMPLANT
PORT ACCESS TROCAR AIRSEAL 12 (TROCAR) ×2 IMPLANT
RELOAD STAPLE 60 2.6 WHT THN (STAPLE) ×6 IMPLANT
RELOAD STAPLE 60 4.1 GRN THCK (STAPLE) ×6 IMPLANT
RELOAD STAPLER GREEN 60MM (STAPLE) ×8 IMPLANT
RELOAD STAPLER WHITE 60MM (STAPLE) ×16 IMPLANT
RETRACTOR LONRSTAR 16.6X16.6CM (MISCELLANEOUS) IMPLANT
RETRACTOR STAY HOOK 5MM (MISCELLANEOUS) IMPLANT
RETRACTOR STER APS 16.6X16.6CM (MISCELLANEOUS)
RETRACTOR WND ALEXIS 18 MED (MISCELLANEOUS) ×2 IMPLANT
RTRCTR WOUND ALEXIS 18CM MED (MISCELLANEOUS) ×2
SEAL CANN UNIV 5-8 DVNC XI (MISCELLANEOUS) ×8 IMPLANT
SEAL XI 5MM-8MM UNIVERSAL (MISCELLANEOUS) ×8
SET TRI-LUMEN FLTR TB AIRSEAL (TUBING) ×2 IMPLANT
SOLUTION ELECTROLUBE (MISCELLANEOUS) ×2 IMPLANT
SPIKE FLUID TRANSFER (MISCELLANEOUS) ×2 IMPLANT
SPONGE T-LAP 4X18 ~~LOC~~+RFID (SPONGE) ×2 IMPLANT
STAPLER ECHELON LONG 60 440 (INSTRUMENTS) ×2 IMPLANT
STAPLER RELOAD GREEN 60MM (STAPLE) ×8
STAPLER RELOAD WHITE 60MM (STAPLE) ×16
STENT SET URETHERAL LEFT 7FR (STENTS) ×2 IMPLANT
STENT SET URETHERAL RIGHT 7FR (STENTS) ×2 IMPLANT
SURGIFLO W/THROMBIN 8M KIT (HEMOSTASIS) IMPLANT
SUT CHROMIC 4 0 RB 1X27 (SUTURE) ×2 IMPLANT
SUT ETHILON 3 0 PS 1 (SUTURE) ×2 IMPLANT
SUT MNCRL AB 4-0 PS2 18 (SUTURE) ×4 IMPLANT
SUT PDS AB 1 CT1 27 (SUTURE) ×6 IMPLANT
SUT SILK 3 0 SH 30 (SUTURE) IMPLANT
SUT SILK 3 0 SH CR/8 (SUTURE) ×2 IMPLANT
SUT VIC AB 2-0 CT1 27 (SUTURE)
SUT VIC AB 2-0 CT1 27XBRD (SUTURE) IMPLANT
SUT VIC AB 2-0 SH 18 (SUTURE) IMPLANT
SUT VIC AB 2-0 UR5 27 (SUTURE) ×8 IMPLANT
SUT VIC AB 3-0 SH 27 (SUTURE) ×12
SUT VIC AB 3-0 SH 27X BRD (SUTURE) ×4 IMPLANT
SUT VIC AB 3-0 SH 27XBRD (SUTURE) ×8 IMPLANT
SUT VIC AB 4-0 RB1 27 (SUTURE) ×8
SUT VIC AB 4-0 RB1 27XBRD (SUTURE) ×8 IMPLANT
SUT VLOC BARB 180 ABS3/0GR12 (SUTURE) ×2
SUTURE VLOC BRB 180 ABS3/0GR12 (SUTURE) ×2 IMPLANT
SYR 50ML LL SCALE MARK (SYRINGE) IMPLANT
SYR CONTROL 10ML LL (SYRINGE) IMPLANT
SYS KII OPTICAL ACCESS 15MM (TROCAR) ×2
SYSTEM KII OPTICAL ACCESS 15MM (TROCAR) ×2 IMPLANT
SYSTEM UROSTOMY GENTLE TOUCH (WOUND CARE) ×2 IMPLANT
TOWEL OR NON WOVEN STRL DISP B (DISPOSABLE) ×2 IMPLANT
TROCAR Z-THREAD FIOS 5X100MM (TROCAR) IMPLANT
TUBING CONNECTING 10 (TUBING) ×2 IMPLANT
WATER STERILE IRR 1000ML POUR (IV SOLUTION) ×2 IMPLANT
WATER STERILE IRR 3000ML UROMA (IV SOLUTION) ×2 IMPLANT

## 2022-05-09 NOTE — Discharge Instructions (Signed)

## 2022-05-09 NOTE — Anesthesia Procedure Notes (Signed)
Procedure Name: Intubation Date/Time: 05/09/2022 8:36 AM  Performed by: Maxwell Caul, CRNAPre-anesthesia Checklist: Patient identified, Emergency Drugs available, Suction available and Patient being monitored Patient Re-evaluated:Patient Re-evaluated prior to induction Oxygen Delivery Method: Circle system utilized Preoxygenation: Pre-oxygenation with 100% oxygen Induction Type: IV induction Ventilation: Mask ventilation without difficulty Laryngoscope Size: Mac and 4 Grade View: Grade I Tube type: Oral Tube size: 7.0 mm Number of attempts: 1 Airway Equipment and Method: Stylet Placement Confirmation: ETT inserted through vocal cords under direct vision, positive ETCO2 and breath sounds checked- equal and bilateral Secured at: 21 cm Tube secured with: Tape Dental Injury: Teeth and Oropharynx as per pre-operative assessment

## 2022-05-09 NOTE — Op Note (Signed)
NAME: Brandy Wheeler, Brandy Wheeler MEDICAL RECORD NO: [id number] ACCOUNT NO: [id number] DATE OF BIRTH: April 12, 1932 FACILITY: [facility] LOCATION: [facility] PHYSICIAN: [name], MD  Operative Report   DATE OF PROCEDURE: 05/09/2022  PREOPERATIVE DIAGNOSIS:  Clinical stage III to stage IV bladder cancer.  PROCEDURE PERFORMED:   1.  Cystoscopy with injection of ICG dye. 2.  Robotic laparoscopic radical cystectomy, bilateral pelvic lymphadenectomy and ileal conduit urinary diversion.  ESTIMATED BLOOD LOSS:  786 mL  COMPLICATIONS:  None.  SPECIMENS:   1.  Right distal ureteral margin frozen section negative. 2.  Left distal ureteral margin frozen section negative. 3.  Right final ureteral margin. 4.  Left final ureteral margin. 5.  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 6.  Right external iliac lymph nodes. 7.  Right common iliac lymph nodes. 8.  Right obturator lymph nodes. 9.  Left obturator lymph nodes. 10.  Left external iliac lymph nodes. 11.  Left common iliac lymph nodes.  ASSISTANT:  Debbrah Alar, PA  DRAINS:   1.  Jackson-Pratt drain to bulb suction. 2.  Right lower quadrant urostomy to gravity drainage with right (red) and left (blue) bander stents. 3.  Flexi-Seal per rectum.  FINDINGS:   1.  Significant papillary bladder tumor recurrence in eventually all quadrants of the bladder by cystoscopy. 2.  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  This was suspected on prior CT confirmed today. 3.  Extensive intra-abdominal adhesions.  INDICATIONS:  This is a very pleasant and quite vigorous 89 year old lady with a very high functional capacity.  She was found to have muscle invasive bladder cancer last year.  She was clinically localized on staging imaging given her very vigorous  clinical status.  Options were discussed including curative/noncurative paths. She wished to undergo a  curative therapy.  She underwent neoadjuvant chemotherapy, which she tolerated well, x4 cycles.  Restaging imaging was unremarkable for advanced  disease.  She was then counseled towards completion of her curative therapy with pelvic examination with cystectomy and she wished to proceed.  She was scheduled for this previously, however, had another hospitalization for a leg injury, which  unfortunately delayed this additional a month or two.  Repeat staging again did not reveal distant disease, but there was some clinical suspicion for possible new vesicovaginal fistula.  She was admitted yesterday for a preop labs, bowel prep, stomal  marking.  Informed consent was obtained and placed in medical record.  PROCEDURE IN DETAIL:  The patient being identified and verified, procedure being robotic cystectomy with ileal conduit urinary diversion, pelvic lymph node dissection, cystoscopy was confirmed.  Procedure timeout was performed.  Intravenous antibiotics  were administered.  General endotracheal anesthesia induced.  The patient was placed into a low lithotomy position.  She was still experiencing some liquid stool output from her bowel prep, therefore, Flexi-Seal apparatus was placed per rectum.  She was  further fastened to operating table using 3-inch tape with foam padding across supraxiphoid chest.  After tucking arms at her side with gel rolls.  Test of steep Trendelenburg positioning was performed and found to be suitably positioned.  Sterile field  was created, prepped and draped the patient's vagina, introitus, and proximal thighs using iodine and her infra-xiphoid abdomen using chlorhexidine gluconate. Her in situ port was well out of the field and padded.  Attention was directed at cystoscopy  with injection of ICG dye.  Cystourethroscopy was performed using 24-French  injection scope set.  Inspection of the bladder revealed significant volume recurrence of papillary tissue extending the bladder neck  towards the area of the true urethra.  This  papillary and nodular tissue in all quadrants of the bladder. Visualization was quite poor.  Next, approximately 2 mL of ICG dye was injected across several submucosal blebs near the intertrigone area.  A new silicone Foley catheter was placed per  urethra to straight drain and a high flow, low pressure pneumoperitoneum was obtained using Veress technique in the supraumbilical midline having passed the aspiration drop test.  An 8 mm robotic camera port was then placed in the same location.   Laparoscopic examination of peritoneal cavity revealed significant adhesions.  There were loose omental adhesions throughout the anterior abdominal wall and what appeared to be fairly dense adhesions of large bowel, small bowel and the true pelvis.   Additional ports were then carefully placed as follows:  Right paramedian 8 mm robotic port, right far lateral AirSeal assist port, right paramedian 15 mm assist port.  The previously marked stomal site, left paramedian 8 mm robotic port, left far  lateral 8 mm robotic port.  Robot was docked and passed the electronic checks.  Attention was directed at adhesiolysis.  Multiple loops of loose adhesions were taken down of the omentum and the anterior abdominal wall and exposure to the true pelvis.   There were multiple loops of small and large bowel that were quite adhesed.  Attention was directed at adhesiolysis of several loops of small bowel that were tethered to the deep pelvis.  These did not appear to be malignant adhesions rather just  postoperative from prior hysterectomy.  Eventually releasing this, this fell out of the way.  There was no evidence of visceral injury.  There was significant amount of adhesions between the fairly redundant sigmoid, left lower quadrant and the bladder  dome. Very careful adhesiolysis was performed of the descending colon and epiploic fat away from the left lower quadrant especially near the  iliac vessels and very careful teasing this off of the dome of the bladder and posterior aspect.  During this  dissection a sponge stick was placed per vagina to denote orientation and corroborate orientation of vagina versus bladder versus rectum as a very careful adhesiolysis was performed.  I was quite happy with the development of the planes around the area  of bladder dissection.  Attention was directed at left retroperitoneal dissection was made lateral to the descending colon from the area of the iliac vessel superiorly just approximately 18 cm and distally coursing just lateral to the left medial  umbilical ligament towards the anterior abdominal wall.  This created a large retroperitoneal flap, which was retracted medially. Left ureter was encountered as it coursed over the iliac vessels, marked a vessel loop, dissected distally to the  ureterovesical junction, which doubly clipped and ligated the proximal clip containing a dye tagged suture and the proximal above the iliac vessels towards the area of the prior gonadal crossing. Left ureter was tucked out of true pelvis.  Frozen section  negative for carcinoma.  Lateral bladder wall swept away from the pelvic sidewall towards the area of the endopelvic fascia of the deep pelvis.  This better exposed the vascular pedicles as well as the iliac vessels.  Next lymphadenectomy was performed  of the left side.  First, the left common iliac group from the area of the aortic bifurcation to the iliac bifurcation.  Lymphostasis was achieved with  cold clips, set aside and labeled left common iliac lymph nodes.  Next, left external group was  dissected free with the boundaries being left iliac bifurcation internal ring.  Lymphostasis achieved with cold clips, set aside labeled left external iliac lymph nodes.  Next, left obturator group was dissected free the boundaries being left external  iliac vein, pelvic sidewall, obturator nerve.  Lymphostasis was  achieved with cold clips. Left obturator nerve was inspected following maneuvers and found to be uninjured.  Notably, the internal iliac group was dissected out with the external group.   Sentinel lymphangiography did not reveal any obvious sentinel nodes within the pelvis.  Attention was directed at right sided retroperitoneal dissection.  First, the ileocecal junction was identified as noted by the appendix and terminal ileum was  dissected proximally, distally approximately 15 cm to an area that appeared to have sufficient mobility, vascularity for conduit formation.  This was noted with a silk tag suture and then a clip distal to this to denote proximal, distal orientation.   Incision made lateral to the cecum superiorly with distance of approximately 18 cm and then distally just lateral to the right median umbilical ligament towards the anterior abdominal wall.  This created a large retroperitoneal flap on the right side was  carefully swept medially.  Right ureter was encountered as it coursed the iliac vessels dissected proximally to the area of the gonadal crossing and distally to the ureterovesical junction, which was doubly clipped and ligated.  There was significant  desmoplastic reaction of the pelvis, likely consistent with postoperative adhesions from her prior hysterectomy.  The proximal tag suture containing a white tagged clip.  Frozen section negative for carcinoma.  Right ureter was tucked out of true pelvis.   Right bladder wall swept away from the pelvic sidewall towards the endopelvic fascia on the right side. Using the previously placed vaginal sponge stick the area of the vaginal apex was carefully identified.  Dissection proceeded directly into this.   Once we established a vaginal planes a vesicovaginal fistula was directly visualized as suspected by prior CT scan.  This was approximately 2 cm in diameter and closed at the vaginal apex.  The vaginal incision was carried down  laterally as distally as  possible to the posterior aspect remain in apposition to the anterior rectal wall.  This exposed vascular pedicles of the bladder, vagina, which were controlled using a sequential stapling technique required white load stapler, taking exquisite care to  avoid visceral injury or vascular injury, which did not occur.  Bilateral obturator nerve was also inspected following these maneuvers and found to be uninjured.  The space of Retzius was then developed towards the area of the membranous urethra and a  complete urethra excising technique was used.  This completely freed up the bladder with anterior vaginal wall and fistula en bloc placed into EndoCatch bag for later retrieval.  Digital rectal exam was then performed using indicator glove around the  area of the Flexi-Seal and no evidence of rectal violation was noted.  The posterior vaginal wall was then developed further as a flap away from the anterior prerectal fat and a double arm V-Loc suture was used to reapproximate the vaginal cuff by  creating a clamshell anterior flap.  This revealed an excellent restoration of the vaginal mucosa.  As she is sexually active we purposely did not leave her with much vaginal length to prevent future prolapse.  We achieved the goals of the extirpated  portion  of procedure today.  Next, the left ureter was tunneled to the right side via retroperitoneal window developed just anterior to the aortic bifurcation and the right ureter, left ureter, terminal ileum tag sutures were placed into Hem-o-lok clip.   A closed suction drain was brought out the previous left lateral most robotic port site through the peritoneal cavity.  Specimen bag was brought to the left side of the abdomen and the specimen, string exiting through the left paramedian robotic port  site and the right ureter, left ureter, terminal ileum tag sutures were grasped with a laparoscopic needle grasper via the right sided assistant  port sites.  These structures did not cross the midline. Robot was then undocked.  Specimen was retrieved by  extending the previous camera port site inferiorly erring to the left side the umbilicus for a total distance of approximately 7 cm, removing the bladder plus anterior vaginal wall fistula specimen en bloc set aside for permanent pathology.  Wound  protector type retractor was then applied at this site and the right ureter, left ureter, terminal ileum were brought through this, did appear to be suitable length, vascularity and typical geometry for conduit formation.  As such, a 14 cm segment of  distal ileum was taken into continuity using green load stapler at the previously marked bowel site.  Mesentery was developed with 1.5 loads of white load stapler distal 1 load proximal taking exquisite care to ensure adequate vascularity of the conduit  and anastomotic segments.  The conduit segment was laid into retroperitoneal orientation and bowel-bowel anastomosis was performed using 1.5 loads of green load stapler on the antimesenteric border.  The free end was oversewn with running silk and a  second imbricating layer of running silk.  The acute angle of the anastomosis was bolstered using interrupted silk.  Mesenteric defect was reapproximated using interrupted silk.  The bowel-bowel anastomosis was visibly viable, palpably patent redelivered  into abdominal cavity.  Attention was directed at conduit formation.  The proximal conduit staple line was excluded using running Vicryl, distal staple line was removed.  The left ureter was then trimmed to length, spatulated to the final ureteral  margin sent. A 4 mm segment of proximal bowel mucosa and serosa was excised using Potts scissors and 4 mucosal everting sutures were applied.  Heel stitch of 4-0 Vicryl was placed and then a blue color bander stent was placed 25 cm anastomosis.  This  being the left side and further ureteroenteric anastomosis was  performed using 2 separate running suture layers of running 4-0 Vicryl, which revealed an excellent tension-free apposition.  A chromic stitch was used at the midpoint of the conduit to  anchor the stent in place.  Next, the right ureter was anastomosed in a similar fashion with a final right ureteral margin sent by placing a red color Bander stent 25 cm anastomosis on the right side.  This was also anchored to the midpoint of the  conduit using chromic suture.  I was quite happy with the ureteroenteric anastomoses, which were tension-free. Conduit were made visibly viable, excellent vascularity. 2 previously marked conduit site was then further developed by excising approximately  quarter size diameter column of skin and subcutaneous tissue to the area of the fascia which was dilated to accommodate 2 surgeon's fingers.  Four fascial anchoring sutures were applied in a quadrant fashion. Conduit was brought through this and the  quadrant sutures were anchored to the proximal aspect of the conduit.  The abdomen  was once again inspected via the extraction site.  Hemostasis was excellent.  No evidence of visceral injury.  Omentum was brought over the extraction port site, which was  then closed with fascia using figure-of-eight PDS x6 followed by reapproximation of Scarpa's with running Vicryl.  All incision sites were infiltrated with dilute lipolyzed Marcaine and closed at the level of the skin using subcuticular Monocryl  followed by Dermabond.  The conduit was then finally matured. The previously placed fascial anchoring sutures were then used in a quadrant fashion as rosebudding sutures, which showed excellent rosebudding of the stoma and bowel mucosa to skin was  further reapproximated using interrupted Vicryl x3 each quadrant. Urostomy appliance was placed.  Stents were trimmed to length.  The procedure was terminated.  The patient tolerated the procedure well, no immediate perioperative complications.   The  patient taken to the postanesthesia care in stable condition.  Plan for step-down admission given her age and baseline preoperative anemia.  Please note, first assistant, Debbrah Alar, was crucial for all portions of surgery today.  She provided invaluable retraction, suctioning, robotic instrument exchange, vascular stapling, vascular clipping and general first assistance.   PUS D: 05/09/2022 2:20:11 pm T: 05/09/2022 4:30:00 pm  JOB: [id number]/ [id number]

## 2022-05-09 NOTE — H&P (Signed)
Brandy Wheeler is an 89 y.o. female.    Chief Complaint: Pre-Op Cystectomy / Conduit Diversion  HPI:   1 - Very Large Volume, Clinical Stage 3 Bladder Cancer - high grade very large volume (not endoscopically managable) urothelial cancer at [facility] 09/2021 by Dr. Gloriann Loan on eval gross hematuria. Cr 0.83, CT clinically localized but significant perivesical stranding / hypervascularity c/w T3 disese. Received 4 cycles neo-adjuvant chemo and restaging CT chest/abd pelvis 02/2022 w/o overtly metastatic disease, single ureters bilateral   2 - Mild Right Malignant Hydronephrosis - mild Rt hydro by CT 10/2021 c/w partial malignatn obstruction. Cr 0.8.   3 - Irritative Voiding, Urinary Urgency - refracotry irritative voiding from bladder cancer. Failed numerous empiric meds such as anticholinergics and B3 agonissts, this is a direct tumor effect. PVR normal x several.   4 - Cancer Pain - on oxycodone prn for pelvic / Rt flank pain from her cancer. Uses very rareliy.   PMH sig for TAH, appy, (all pfanensteil). No CV disease / blood thinners. She still drives, enjoys gardening, lives with daughter [name] who is very involved. Gets most PCP needs through [facility].   Today " Brandy Wheeler " (prnounced Jed-Orah) is seen to proceed with  cystectomy, node dissection, conduit diversion for aggressive and symptomatic bladder cancer. Hgb 9.6 (stable), Cr 1.1. She had stomal marking and completed bowel prep to clear. Repeat CT 04/2022 w/o overt metastatic disease, some question of VVF (none clinically). Most recent UCX e. Coli coloniztations sens to zosyn.   Past Medical History:  Diagnosis Date   Bladder cancer ([place])    Diverticulitis    Fracture of right fibula 10/10/2013   closed   GERD (gastroesophageal reflux disease)    History of hiatal hernia    Hypertension     Past Surgical History:  Procedure Laterality Date   ABDOMINAL HYSTERECTOMY  1978   APPENDECTOMY     AGE 85   CYSTOSCOPY W/ URETERAL STENT  PLACEMENT Right 10/09/2021   Procedure: POSSIBLE BILATERAL  RETROGRADE POSSIBLE RIGHT Melene Muller STENT;  Surgeon: Lucas Mallow, MD;  Location: [facility];  Service: Urology;  Laterality: Right;   DIAGNOSTIC LAPAROSCOPY  1974   IR IMAGING GUIDED PORT INSERTION  11/15/2021   Sacrohysteropexy  1956   to lift uterus prior to getting pregnant   TRANSURETHRAL RESECTION OF BLADDER TUMOR Bilateral 10/09/2021   Procedure: TRANSURETHRAL RESECTION OF BLADDER TUMOR ([facility]) POSSIBLE BILATERAL RETROGRADE POSSIBLE RIGHT URETERAL STENT;  Surgeon: Lucas Mallow, MD;  Location: [facility];  Service: Urology;  Laterality: Bilateral;    History reviewed. No pertinent family history. Social History:  reports that she has never smoked. She has never used smokeless tobacco. She reports current alcohol use. She reports that she does not use drugs.  Allergies: No Known Allergies  Medications Prior to Admission  Medication Sig Dispense Refill   Cholecalciferol (VITAMIN D3 PO) Take 1 tablet by mouth in the morning.     ciprofloxacin (CIPRO) 500 MG tablet Take 1 tablet (500 mg total) by mouth 2 (two) times daily for 10 days. 20 tablet 0   Cyanocobalamin (VITAMIN B-12 PO) Take 1 tablet by mouth in the morning.     mirabegron ER (MYRBETRIQ) 50 MG TB24 tablet Take 1 tablet (50 mg total) by mouth daily. 30 tablet 0   Multiple Minerals-Vitamins (BONE DENSITY BUILDER PO) Take 1 tablet by mouth in the morning. [employer] Bone Builder Forte     omeprazole (PRILOSEC) 20 MG capsule  Take 20 mg by mouth in the morning.     oxyCODONE-acetaminophen (PERCOCET) 5-325 MG tablet Take 1 tablet by mouth every 4 (four) hours as needed for severe pain (May cause constipation). 30 tablet 0   potassium chloride (KLOR-CON M) 10 MEQ tablet Take 2 tablets (20 mEq total) by mouth 2 (two) times daily for 7 days. 28 tablet 0    Results for orders placed or performed during the hospital encounter of 05/08/22 (from the past 48 hour(s))  Surgical  pcr screen     Status: None   Collection Time: 05/09/22 12:02 AM   Specimen: Nasal Mucosa; Nasal Swab  Result Value Ref Range   MRSA, PCR NEGATIVE NEGATIVE   Staphylococcus aureus NEGATIVE NEGATIVE    Comment: (NOTE) The Xpert SA Assay (FDA approved for NASAL specimens in patients 71 years of age and older), is one component of a comprehensive surveillance program. It is not intended to diagnose infection nor to guide or monitor treatment. Performed at [facility], [place] [address] [place], [place] [address]   CBC     Status: Abnormal   Collection Time: 05/09/22  3:05 AM  Result Value Ref Range   WBC 9.3 4.0 - 10.5 K/uL   RBC 3.30 (L) 3.87 - 5.11 MIL/uL   Hemoglobin 9.6 (L) 12.0 - 15.0 g/dL   HCT 30.3 (L) 36.0 - 46.0 %   MCV 91.8 80.0 - 100.0 fL   MCH 29.1 26.0 - 34.0 pg   MCHC 31.7 30.0 - 36.0 g/dL   RDW 12.8 11.5 - 15.5 %   Platelets 296 150 - 400 K/uL   nRBC 0.0 0.0 - 0.2 %    Comment: Performed at [facility], [place] [address], [place], [place] [address]  Comprehensive metabolic panel     Status: Abnormal   Collection Time: 05/09/22  3:05 AM  Result Value Ref Range   Sodium 133 (L) 135 - 145 mmol/L   Potassium 3.6 3.5 - 5.1 mmol/L   Chloride 93 (L) 98 - 111 mmol/L   CO2 29 22 - 32 mmol/L   Glucose, Bld 112 (H) 70 - 99 mg/dL    Comment: Glucose reference range applies only to samples taken after fasting for at least 8 hours.   BUN 18 8 - 23 mg/dL   Creatinine, Ser 1.16 (H) 0.44 - 1.00 mg/dL   Calcium 10.3 8.9 - 10.3 mg/dL   Total Protein 5.7 (L) 6.5 - 8.1 g/dL   Albumin 2.7 (L) 3.5 - 5.0 g/dL   AST 17 15 - 41 U/L   ALT 9 0 - 44 U/L   Alkaline Phosphatase 82 38 - 126 U/L   Total Bilirubin 0.5 0.3 - 1.2 mg/dL   GFR, Estimated 45 (L) >60 mL/min    Comment: (NOTE) Calculated using the CKD-EPI Creatinine Equation (2021)    Anion gap 11 5 - 15    Comment: Performed at [facility], [place] [address]  [place], [place] [address]  Type and screen     Status: None (Preliminary result)   Collection Time: 05/09/22  3:05 AM  Result Value Ref Range   ABO/RH(D) PENDING    Antibody Screen PENDING    Sample Expiration      05/12/2022,2359 Performed at [facility], [place] [address], [place], [place] [address]    No results found.  Review of Systems  Constitutional:  Negative for chills and fever.  Genitourinary:  Positive for dysuria, hematuria  and urgency.  All other systems reviewed and are negative.   Blood pressure (!) 146/57, pulse (!) 102, temperature 97.7 F (36.5 C), temperature source Oral, resp. rate 18, height '5\' 4"'$  (1.626 m), weight 57.9 kg, SpO2 95 %. Physical Exam Vitals reviewed.  Constitutional:      Comments: Mentally and physically spry for age, at baseline.   HENT:     Head: Normocephalic.     Nose: Nose normal.  Eyes:     Pupils: Pupils are equal, round, and reactive to light.  Cardiovascular:     Rate and Rhythm: Normal rate.  Abdominal:     General: Abdomen is flat.     Comments: Stomal marking site noted. Prior pfannenstiel w/o hernias.   Genitourinary:    Comments: No CVAT at present Musculoskeletal:        General: Normal range of motion.     Cervical back: Normal range of motion.  Skin:    General: Skin is warm.  Neurological:     General: No focal deficit present.     Mental Status: She is alert.  Psychiatric:        Mood and Affect: Mood normal.      Assessment/Plan  Proceed as planned with major extirpative surgery for bladder cancer. Risks, benefits, alternatives, expected peri-op course discussed extensively previously over multiple encounters and reiterated today. Although she is quite spry, she understands that her advanced age increases risk of ALL peri-op complications including mortality.   [name], MD 05/09/2022, 5:42 AM

## 2022-05-09 NOTE — Brief Op Note (Signed)
05/09/2022  2:02 PM  PATIENT:  Brandy Wheeler  89 y.o. female  PRE-OPERATIVE DIAGNOSIS:  BLADDER CANCER  POST-OPERATIVE DIAGNOSIS:  BLADDER CANCER  PROCEDURE:  Procedure(s) with comments: XI ROBOTIC ASSISTED LAPAROSCOPIC COMPLETE CYSTECT ILEAL CONDUIT (N/A) - 6 HRS PELVIC LYMPH NODE DISSECTION (Bilateral) CYSTOSCOPY WITH INJECTION OF INDOCYANINE GREEN DYE (N/A)  SURGEON:  Surgeon(s) and Role:    [name], MD - Primary  PHYSICIAN ASSISTANT:   ASSISTANTS: Clemetine Marker PA   ANESTHESIA:   local and general  EBL:  350 mL   BLOOD ADMINISTERED:none  DRAINS:  1 - JP to bulb; 2 - RLQ Urostomy to gravity; 3 - Flex-I-seal per rectum    LOCAL MEDICATIONS USED:  MARCAINE     SPECIMEN:  Source of Specimen:  bladder + anterior vagina with fistula; pelvic lymph nodes; ureteral margins  DISPOSITION OF SPECIMEN:  PATHOLOGY  COUNTS:  YES  TOURNIQUET:  * No tourniquets in log *  DICTATION: .Other Dictation: Dictation Number [id number]  PLAN OF CARE: Admit to inpatient   PATIENT DISPOSITION:  PACU - hemodynamically stable.   Delay start of Pharmacological VTE agent (>24hrs) due to surgical blood loss or risk of bleeding: yes

## 2022-05-09 NOTE — Transfer of Care (Signed)
Immediate Anesthesia Transfer of Care Note  Patient: Brandy Wheeler  Procedure(s) Performed: XI ROBOTIC ASSISTED LAPAROSCOPIC COMPLETE CYSTECT ILEAL CONDUIT PELVIC LYMPH NODE DISSECTION (Bilateral) CYSTOSCOPY WITH INJECTION OF INDOCYANINE GREEN DYE  Patient Location: PACU  Anesthesia Type:General  Level of Consciousness: awake, alert , and oriented  Airway & Oxygen Therapy: Patient Spontanous Breathing and Patient connected to face mask oxygen  Post-op Assessment: Report given to RN and Post -op Vital signs reviewed and stable  Post vital signs: Reviewed and stable  Last Vitals:  Vitals Value Taken Time  BP 181/68 05/09/22 1415  Temp    Pulse 90 05/09/22 1417  Resp 17 05/09/22 1417  SpO2 100 % 05/09/22 1417  Vitals shown include unvalidated device data.  Last Pain:  Vitals:   05/09/22 0735  TempSrc:   PainSc: 0-No pain         Complications: No notable events documented.

## 2022-05-09 NOTE — Progress Notes (Signed)
Attempting ambulate pt, said she broken ankle, could not walk per pt. Changed position.

## 2022-05-09 NOTE — Anesthesia Postprocedure Evaluation (Signed)
Anesthesia Post Note  Patient: Brandy Wheeler  Procedure(s) Performed: XI ROBOTIC ASSISTED LAPAROSCOPIC COMPLETE CYSTECT ILEAL CONDUIT PELVIC LYMPH NODE DISSECTION (Bilateral) CYSTOSCOPY WITH INJECTION OF INDOCYANINE GREEN DYE     Patient location during evaluation: PACU Anesthesia Type: General Level of consciousness: awake and alert Pain management: pain level controlled Vital Signs Assessment: post-procedure vital signs reviewed and stable Respiratory status: spontaneous breathing, nonlabored ventilation and respiratory function stable Cardiovascular status: blood pressure returned to baseline and stable Postop Assessment: no apparent nausea or vomiting Anesthetic complications: no   No notable events documented.  Last Vitals:  Vitals:   05/09/22 1445 05/09/22 1500  BP: (!) 175/68 (!) 164/58  Pulse:  89  Resp: 16 13  Temp:    SpO2: 100% 100%    Last Pain:  Vitals:   05/09/22 1500  TempSrc:   PainSc: 2                  Lynda Rainwater

## 2022-05-09 NOTE — Anesthesia Preprocedure Evaluation (Signed)
Anesthesia Evaluation  Patient identified by MRN, date of birth, ID band Patient awake    Reviewed: Allergy & Precautions, H&P , NPO status , Patient's Chart, lab work & pertinent test results  Airway Mallampati: II  TM Distance: >3 FB Neck ROM: Full    Dental no notable dental hx.    Pulmonary neg pulmonary ROS   Pulmonary exam normal        Cardiovascular hypertension, Pt. on medications negative cardio ROS  Rhythm:Regular Rate:Normal     Neuro/Psych negative neurological ROS  negative psych ROS   GI/Hepatic Neg liver ROS,GERD  Medicated,,  Endo/Other  negative endocrine ROS    Renal/GU negative Renal ROS   Bladder Ca negative genitourinary   Musculoskeletal negative musculoskeletal ROS (+)    Abdominal Normal abdominal exam  (+)   Peds negative pediatric ROS (+)  Hematology negative hematology ROS (+)   Anesthesia Other Findings Bladder Cancer  Reproductive/Obstetrics negative OB ROS                             Anesthesia Physical Anesthesia Plan  ASA: 3  Anesthesia Plan: General   Post-op Pain Management: Tylenol PO (pre-op)* and Dilaudid IV   Induction: Intravenous  PONV Risk Score and Plan: 3 and Ondansetron, Dexamethasone, Treatment may vary due to age or medical condition and Midazolam  Airway Management Planned: Oral ETT  Additional Equipment: None  Intra-op Plan:   Post-operative Plan: Extubation in OR  Informed Consent: I have reviewed the patients History and Physical, chart, labs and discussed the procedure including the risks, benefits and alternatives for the proposed anesthesia with the patient or authorized representative who has indicated his/her understanding and acceptance.     Dental advisory given  Plan Discussed with: CRNA  Anesthesia Plan Comments: (Lab Results      Component                Value               Date                      WBC                       10.9 (H)            10/06/2021                HGB                      14.1                10/06/2021                HCT                      43.4                10/06/2021                MCV                      93.3                10/06/2021                PLT  381                 10/06/2021            Lab Results      Component                Value               Date                      NA                       136                 10/06/2021                K                        4.9                 10/06/2021                CO2                      28                  10/06/2021                GLUCOSE                  118 (H)             10/06/2021                BUN                      21                  10/06/2021                CREATININE               1.20 (H)            10/06/2021                CALCIUM                  9.2                 10/06/2021                GFRNONAA                 44 (L)              10/06/2021          )        Anesthesia Quick Evaluation

## 2022-05-09 NOTE — OR Nursing (Signed)
Surgical update given to family member Gae Dry at [facility]

## 2022-05-09 NOTE — Progress Notes (Signed)
Move to Phase II bed a due to staying PACU until bed available, pt wanted watching TV

## 2022-05-10 ENCOUNTER — Encounter (HOSPITAL_COMMUNITY): Payer: Self-pay | Admitting: Urology

## 2022-05-10 LAB — BASIC METABOLIC PANEL
Anion gap: 7 (ref 5–15)
BUN: 24 mg/dL — ABNORMAL HIGH (ref 8–23)
CO2: 26 mmol/L (ref 22–32)
Calcium: 9.5 mg/dL (ref 8.9–10.3)
Chloride: 98 mmol/L (ref 98–111)
Creatinine, Ser: 1.16 mg/dL — ABNORMAL HIGH (ref 0.44–1.00)
GFR, Estimated: 45 mL/min — ABNORMAL LOW (ref >60)
Glucose, Bld: 104 mg/dL — ABNORMAL HIGH (ref 70–99)
Potassium: 3.7 mmol/L (ref 3.5–5.1)
Sodium: 131 mmol/L — ABNORMAL LOW (ref 135–145)

## 2022-05-10 LAB — HEMOGLOBIN AND HEMATOCRIT, BLOOD
HCT: 24.6 % — ABNORMAL LOW (ref 36.0–46.0)
Hemoglobin: 7.8 g/dL — ABNORMAL LOW (ref 12.0–15.0)

## 2022-05-10 MED ORDER — ENOXAPARIN SODIUM 30 MG/0.3ML IJ SOSY
30.0000 mg | PREFILLED_SYRINGE | INTRAMUSCULAR | Status: DC
  Administered 2022-05-10 – 2022-05-16 (×7): 30 mg via SUBCUTANEOUS
  Filled 2022-05-10 (×7): qty 0.3

## 2022-05-10 MED ORDER — METOCLOPRAMIDE HCL 5 MG/ML IJ SOLN
10.0000 mg | Freq: Three times a day (TID) | INTRAMUSCULAR | Status: DC
  Administered 2022-05-10 – 2022-05-17 (×21): 10 mg via INTRAVENOUS
  Filled 2022-05-10 (×21): qty 2

## 2022-05-10 MED ORDER — LACTATED RINGERS IV SOLN: INTRAVENOUS | Status: DC

## 2022-05-10 MED ORDER — CHLORHEXIDINE GLUCONATE CLOTH 2 % EX PADS
6.0000 | MEDICATED_PAD | Freq: Every day | CUTANEOUS | Status: DC
  Administered 2022-05-10 – 2022-05-17 (×8): 6 via TOPICAL

## 2022-05-10 MED ORDER — PANTOPRAZOLE SODIUM 40 MG PO TBEC
40.0000 mg | DELAYED_RELEASE_TABLET | Freq: Every day | ORAL | Status: DC
  Administered 2022-05-10 – 2022-05-17 (×8): 40 mg via ORAL
  Filled 2022-05-10 (×8): qty 1

## 2022-05-10 NOTE — Progress Notes (Signed)
1 Day Post-Op   Subjective/Chief Complaint:  1 - Aggressive Bladder Cancer - s/p robotic cystectomy / node dissection / conduit diversion 05/09/22. Path pending. Admitted 1/16 for bowel prep. Prophy Lovenox (since non-ambulatory) started POD 1.   2 - Ileus - bowel anastamosis as part of planned urinary diversion above. NPO initiall post-op. Received entereg. Reglan added POD 1.   3 - Disposition / Rehab - independatn at baseline, but had recent left leg fracture and non-weight bearing for next month. Also new ostomy. Will likely need facility at DC.  PT eval pending. Ostomy RN team working with in house.  Today "Erm[place]" is making progress. Some mild gas pains as expected. No emesis. Hgb low but acceptable given baseline anemia. Stable regular tachycardia (had pre-op).    Objective: Vital signs in last 24 hours: Temp:  [97.5 F (36.4 C)-98.2 F (36.8 C)] 98.2 F (36.8 C) (01/18 1110) Pulse Rate:  [89-113] 106 (01/18 1200) Resp:  [10-20] 18 (01/18 1200) BP: (108-182)/(37-95) 149/40 (01/18 1200) SpO2:  [90 %-100 %] 97 % (01/18 1200) Weight:  [60 kg] 60 kg (01/18 0400) Last BM Date :  (PTA)  Intake/Output from previous day: 01/17 0701 - 01/18 0700 In: 3436.4 [P.O.:120; I.V.:2709.1; IV Piggyback:607.3] Out: 1922 [Urine:400; Drains:435; Stool:737; Blood:350] Intake/Output this shift: Total I/O In: 670.7 [I.V.:524.6; IV Piggyback:146.1] Out: 160 [Urine:90; Drains:70]  NAD in stepdown, family at bedside. AOx3, very pleasant. Non-labored breathing on RA Stable regular tachy cardai at 108bpm by monitor SNTND, recent port and extraction sties c/d/I.  RLQ Uroatomy pink/patent with Rt (red) and Lt (blue) bander stents and non-foul urine RLE SCD in place. LLE in cast with stable mild foot edema   Lab Results:  Recent Labs    05/09/22 0305 05/09/22 1601 05/10/22 0441  WBC 9.3  --   --   HGB 9.6* 9.0* 7.8*  HCT 30.3* 27.6* 24.6*  PLT 296  --   --    BMET Recent Labs     05/09/22 0305 05/10/22 0441  NA 133* 131*  K 3.6 3.7  CL 93* 98  CO2 29 26  GLUCOSE 112* 104*  BUN 18 24*  CREATININE 1.16* 1.16*  CALCIUM 10.3 9.5   PT/INR No results for input(s): "LABPROT", "INR" in the last 72 hours. ABG No results for input(s): "PHART", "HCO3" in the last 72 hours.  Invalid input(s): "PCO2", "PO2"  Studies/Results: No results found.  Anti-infectives: Anti-infectives (From admission, onward)    Start     Dose/Rate Route Frequency Ordered Stop   05/09/22 2100  piperacillin-tazobactam (ZOSYN) IVPB 3.375 g        3.375 g 12.5 mL/hr over 240 Minutes Intravenous Every 8 hours 05/09/22 2048 05/10/22 2059   05/09/22 2054  piperacillin-tazobactam (ZOSYN) 3.375 GM/50ML IVPB       Note to Pharmacy: Nicholes Rough S: cabinet override      05/09/22 2054 05/09/22 2306   05/09/22 0627  piperacillin-tazobactam (ZOSYN) IVPB 3.375 g        3.375 g 100 mL/hr over 30 Minutes Intravenous 30 min pre-op 05/09/22 0627 05/09/22 0840   05/08/22 2200  neomycin (MYCIFRADIN) tablet 500 mg        500 mg Oral Every 4 hours 05/08/22 2105 05/09/22 0123   05/08/22 2200  metroNIDAZOLE (FLAGYL) tablet 500 mg        500 mg Oral Every 4 hours 05/08/22 2105 05/09/22 0123       Assessment/Plan:  Doing acceptable POD 1. NPO but ice chips,  OK to transfer to progressive 4th floor if avail. Will start lovenox proph as non-ambulatory. Appreciate ostomy RN team.    [name] 05/10/2022

## 2022-05-10 NOTE — Consult Note (Addendum)
[facility] Nurse ostomy consult note: POD 1 Stoma type/location: RLQ ileal conduit Stomal assessment/size: 1 and 1/8 inch , edematous, red, round, moist with 2 stents in place. Red = right ureter, Blue = Left Peristomal assessment: intact with deep creases in parastomal plane Treatment options for stomal/peristomal skin: skin barrier ring and convex urostomy pouch  Output: blood tinged urine Ostomy pouching: 1pc.convex ostomy pouch and skin barrier ring.  Order Supplies: 1-piece convex urostomy pouch Kellie Simmering # [phone]), skin barrier ring Kellie Simmering # [id number]. Education provided: None today. Patient is confused and in the ICU. Enrolled patient in [place] program: Yes, today 4 pouches, 4 rings, 1 belt, 1 bottle stoma powder, 2 adapters and 1 bedside urinary drainage bag requested.   [facility] nursing team will follow, and will remain available to this patient, the nursing and medical teams.    Thank you for inviting [place] to participate in this patient's Plan of Care.  Maudie Flakes, MSN, RN, CNS, [place], Serita Grammes, [employer], [employer] phone:  [phone]

## 2022-05-10 NOTE — TOC Initial Note (Addendum)
Transition of Care [facility]) - Initial/Assessment Note    Patient Details  Name: Brandy Wheeler MRN: [id number] Date of Birth: 20-Aug-1931  Transition [facility]) CM/SW Contact:    Roseanne Kaufman, RN Phone Number: 05/10/2022, 6:43 PM  Clinical Narrative:     Patient from home with adult children, does not have any DME or HH needs prior to admission. Patient does not have a PCP however does have a managed insurance plan. Per patient's daughter she gets around fine and has not been sickly since her recent fall.  PCP resources:  [email or web address]  and contacting her insurance for list of primary care providers in network.    TOC will follow for needs.                 Barriers to Discharge: Continued Medical Work up   Patient Goals and CMS Choice            Expected Discharge Plan and Services In-house Referral: NA Discharge Planning Services: CM Consult   Living arrangements for the past 2 months: Single Family Home                 DME Arranged: N/A DME Agency: NA       HH Arranged: NA HH Agency: NA        Prior Living Arrangements/Services Living arrangements for the past 2 months: Single Family Home Lives with:: Adult Children Patient language and need for interpreter reviewed:: Yes Do you feel safe going back to the place where you live?: Yes      Need for Family Participation in Patient Care: No (Comment) Care giver support system in place?: Yes (comment) Current home services: Other (comment) (none) Criminal Activity/Legal Involvement Pertinent to Current Situation/Hospitalization: No - Comment as needed  Activities of Daily Living Home Assistive Devices/Equipment: Wheelchair, Radio producer (specify quad or straight), Hearing aid ADL Screening (condition at time of admission) Patient's cognitive ability adequate to safely complete daily activities?: Yes Is the patient deaf or have difficulty hearing?: Yes Does the patient have difficulty  seeing, even when wearing glasses/contacts?: No Does the patient have difficulty concentrating, remembering, or making decisions?: No Patient able to express need for assistance with ADLs?: Yes Does the patient have difficulty dressing or bathing?: Yes (recent ankle fracture) Independently performs ADLs?: No (recent ankle injury) Communication: Independent Dressing (OT): Needs assistance (recent ankle fracture) Is this a change from baseline?: Pre-admission baseline Grooming: Independent Feeding: Independent Bathing: Needs assistance Is this a change from baseline?: Pre-admission baseline Toileting: Needs assistance Is this a change from baseline?: Pre-admission baseline In/Out Bed: Needs assistance Is this a change from baseline?: Pre-admission baseline Walks in Home: Needs assistance Is this a change from baseline?: Pre-admission baseline Does the patient have difficulty walking or climbing stairs?: Yes (recent ankle fracture) Weakness of Legs: Left Weakness of Arms/Hands: None  Permission Sought/Granted Permission sought to share information with : Case Manager Permission granted to share information with : Yes, Verbal Permission Granted  Share Information with NAME: Case Manager           Emotional Assessment Appearance:: Appears stated age Attitude/Demeanor/Rapport: Gracious Affect (typically observed): Accepting Orientation: : Oriented to Self, Oriented to Place, Oriented to  Time Alcohol / Substance Use: Not Applicable Psych Involvement: No (comment)  Admission diagnosis:  Surgery, elective [Z41.9] Bladder cancer [facility]) [C67.9] Patient Active Problem List   Diagnosis Date Noted   Surgery, elective 05/09/2022   Bladder cancer ([place]) 05/09/2022   Health education 04/30/2022  Encounter for ostomy care education 04/11/2022   Attention to urostomy [facility]) 04/11/2022   Port-A-Cath in place 11/16/2021   Malignant neoplasm of urinary bladder ([place]) 11/03/2021   Bladder  tumor 10/09/2021   Fracture of distal fibula 10/19/2013   PCP:  Pcp, No Pharmacy:   [facility] [id number] - [place], [place] - [address] [place] [address] Phone: [phone] Fax: [facility] [address], [place] [address] Phone: [phone] Fax: [phone]  [facility] [id number] - [place], [place] [address] [place] [address] Phone: [phone] Fax: [phone]     Social Determinants of Health (SDOH) Social History: [place]: No Food Insecurity (05/08/2022)  Housing: Low Risk  (05/08/2022)  Transportation Needs: No Transportation Needs (05/08/2022)  Utilities: Not At Risk (05/08/2022)  Tobacco Use: Low Risk  (05/10/2022)   SDOH Interventions:     Readmission Risk Interventions     No data to display

## 2022-05-10 NOTE — Progress Notes (Signed)
Initial Nutrition Assessment  DOCUMENTATION CODES:   Non-severe (moderate) malnutrition in context of chronic illness  INTERVENTION:  - Advance diet as medically appropriate.   - Patient's family planning to bring in their own nutrition supplements for patient to try during admission.   - Monitor weight trends.   NUTRITION DIAGNOSIS:   Moderate Malnutrition related to chronic illness (stage 3 bladder cancer) as evidenced by mild fat depletion, mild muscle depletion, percent weight loss (22% weight loss in 7 months).  GOAL:   Patient will meet greater than or equal to 90% of their needs  MONITOR:   Diet advancement, PO intake, Weight trends, Labs  REASON FOR ASSESSMENT:   Malnutrition Screening Tool    ASSESSMENT:   89 y.o. female with PMH of stage 3 bladder cancer and diverticulitis who presented for cystectomy and bilateral lymphadenectomy.   Patient reports UBW of 161# she weighed before she was diagnosed with cancer in June 2023 and weight loss since that time. Per EMR, patient weighed at 161# in June and now weighed at 126# - a 35# or 22% weight loss in 7 months, significant for the time frame.   Patient's family then arrived and further assisted in providing nutrition history. Daughter reports patient eats 3 small meals a day. Often eats eggs and toast for breakfast and has foods such as pot roast, chicken pie, or chicken soup.   She has been trying Pure Synergy Superfood powder at home. They do not like Ensure supplements as they have heard bad things about them. However, they report a family member works for [employer] and he has given them Gelatein to try (4 oz supplement with 90 kcals and 9g of protein) and plans to bring in more nutrition supplements for patient to try. Family did not want to try supplements available in hospital.  Patient remains NPO at this time but is hopeful for clear liquids today.    Medications reviewed and include: Senokot  Labs  reviewed:  Na 131 Creatinine 1.16   NUTRITION - FOCUSED PHYSICAL EXAM:  Flowsheet Row Most Recent Value  Orbital Region Mild depletion  Upper Arm Region Mild depletion  Thoracic and Lumbar Region Mild depletion  Buccal Region Mild depletion  Temple Region Mild depletion  Clavicle Bone Region Mild depletion  Clavicle and Acromion Bone Region Mild depletion  Scapular Bone Region Unable to assess  Dorsal Hand No depletion  Patellar Region No depletion  Anterior Thigh Region No depletion  Posterior Calf Region No depletion  Edema (RD Assessment) Mild  Hair Reviewed  Eyes Reviewed  Mouth Reviewed  Skin Reviewed  Nails Reviewed       Diet Order:   Diet Order             Diet NPO time specified Except for: Ice Chips  Diet effective now                   EDUCATION NEEDS:  Education needs have been addressed  Skin:  Skin Assessment: Reviewed RN Assessment  Last BM:  PTA  Height:  Ht Readings from Last 1 Encounters:  05/10/22 '5\' 3"'$  (1.6 m)   Weight:  Wt Readings:  05/09/22 57 kg    BMI:  Body mass index is 21.56 kg/m.  Estimated Nutritional Needs:  Kcal:  1700-1850 kcals Protein:  75-85 grams Fluid:  >/= 1.7L    Samson Frederic RD, LDN For contact information, refer to [facility].

## 2022-05-10 NOTE — Progress Notes (Signed)
Pt requesting Prilosec with AM meds

## 2022-05-10 NOTE — Progress Notes (Signed)
  Transition of Care (TOC) Screening Note   Patient Details  Name: Brandy Wheeler Date of Birth: 1931-11-15   Transition of Care [facility]) CM/SW Contact:    Roseanne Kaufman, RN Phone Number: 05/10/2022, 6:34 PM    Transition of Care Department [facility]) has reviewed patient and no TOC needs have been identified at this time. We will continue to monitor patient advancement through interdisciplinary progression rounds. If new patient transition needs arise, please place a TOC consult.

## 2022-05-11 DIAGNOSIS — E440 Moderate protein-calorie malnutrition: Secondary | ICD-10-CM | POA: Insufficient documentation

## 2022-05-11 LAB — HEMOGLOBIN AND HEMATOCRIT, BLOOD
HCT: 23.1 % — ABNORMAL LOW (ref 36.0–46.0)
HCT: 26.2 % — ABNORMAL LOW (ref 36.0–46.0)
Hemoglobin: 7.4 g/dL — ABNORMAL LOW (ref 12.0–15.0)
Hemoglobin: 8.2 g/dL — ABNORMAL LOW (ref 12.0–15.0)

## 2022-05-11 LAB — BASIC METABOLIC PANEL
Anion gap: 11 (ref 5–15)
BUN: 26 mg/dL — ABNORMAL HIGH (ref 8–23)
CO2: 25 mmol/L (ref 22–32)
Calcium: 9.9 mg/dL (ref 8.9–10.3)
Chloride: 98 mmol/L (ref 98–111)
Creatinine, Ser: 1.38 mg/dL — ABNORMAL HIGH (ref 0.44–1.00)
GFR, Estimated: 37 mL/min — ABNORMAL LOW (ref >60)
Glucose, Bld: 93 mg/dL (ref 70–99)
Potassium: 3.5 mmol/L (ref 3.5–5.1)
Sodium: 134 mmol/L — ABNORMAL LOW (ref 135–145)

## 2022-05-11 LAB — PREPARE RBC (CROSSMATCH)

## 2022-05-11 MED ORDER — SODIUM CHLORIDE 0.9% IV SOLUTION: Freq: Once | INTRAVENOUS | Status: AC

## 2022-05-11 NOTE — Progress Notes (Signed)
Report called to Jacklynn Bue RN. All questions answered at this time. Patient was transported in the bed, on 2L [place], by RN and NT. All patient belongings and paper chart transferred with patient. Patient's son present during transfer. Handoff completed.

## 2022-05-11 NOTE — Progress Notes (Signed)
2 Days Post-Op   Subjective/Chief Complaint:  1 - Aggressive Bladder Cancer - s/p robotic cystectomy / node dissection / conduit diversion 05/09/22. Path pending. Admitted 1/16 for bowel prep. Proph Lovenox (since non-ambulatory) started POD 1.   2 - Ileus - bowel anastamosis as part of planned urinary diversion above. NPO initiall post-op. Received entereg. Reglan added POD 1. Clears started POD 2.   3 - Acute on Chronic Anemia - hgb 9's on admit likely from some chronic hematuria and anemia of chronic disease. Hgb 7's post-op but with some tachycardia.   4 - Disposition / Rehab - independatn at baseline, but had recent left leg fracture and non-weight bearing for next month. Also new ostomy. Will likely need facility at DC.  PT eval pending. Ostomy RN team working with in house.  Today "Brandy Wheeler" is  stable. No emesis or fevers. Pain controlled. Has not been out of bed yet. HR stable but tach 105-120 at reset (was same pre-op).    Objective: Vital signs in last 24 hours: Temp:  [97.5 F (36.4 C)-98.8 F (37.1 C)] 98.8 F (37.1 C) (01/19 0400) Pulse Rate:  [91-121] 121 (01/19 0700) Resp:  [13-20] 19 (01/19 0700) BP: (121-181)/(34-95) 181/59 (01/19 0700) SpO2:  [90 %-100 %] 100 % (01/19 0700) Last BM Date :  (PTA)  Intake/Output from previous day: 01/18 0701 - 01/19 0700 In: 1889.7 [I.V.:1718.5; IV Piggyback:171.1] Out: 1065 [Urine:815; Drains:250] Intake/Output this shift: No intake/output data recorded.  NAD in stepdown, fAOx3, very pleasant. Non-labored breathing on RA Stable regular tachy cardai at 120bpm by monitor SNTND, recent port and extraction sties c/d/I.  RLQ Uroatomy pink/patent with Rt (red) and Lt (blue) bander stents and non-foul urine Flexiseal in place with scant output, removed.  RLE SCD in place. LLE in cast with stable mild foot edema  Lab Results:  Recent Labs    05/09/22 0305 05/09/22 1601 05/10/22 0441 05/11/22 0626  WBC 9.3  --   --   --   HGB  9.6*   < > 7.8* 7.4*  HCT 30.3*   < > 24.6* 23.1*  PLT 296  --   --   --    < > = values in this interval not displayed.   BMET Recent Labs    05/10/22 0441 05/11/22 0626  NA 131* 134*  K 3.7 3.5  CL 98 98  CO2 26 25  GLUCOSE 104* 93  BUN 24* 26*  CREATININE 1.16* 1.38*  CALCIUM 9.5 9.9   PT/INR No results for input(s): "LABPROT", "INR" in the last 72 hours. ABG No results for input(s): "PHART", "HCO3" in the last 72 hours.  Invalid input(s): "PCO2", "PO2"  Studies/Results: No results found.  Anti-infectives: Anti-infectives (From admission, onward)    Start     Dose/Rate Route Frequency Ordered Stop   05/09/22 2100  piperacillin-tazobactam (ZOSYN) IVPB 3.375 g        3.375 g 12.5 mL/hr over 240 Minutes Intravenous Every 8 hours 05/09/22 2048 05/10/22 1636   05/09/22 2054  piperacillin-tazobactam (ZOSYN) 3.375 GM/50ML IVPB       Note to Pharmacy: Nicholes Rough S: cabinet override      05/09/22 2054 05/09/22 2306   05/09/22 0627  piperacillin-tazobactam (ZOSYN) IVPB 3.375 g        3.375 g 100 mL/hr over 30 Minutes Intravenous 30 min pre-op 05/09/22 0627 05/09/22 0840   05/08/22 2200  neomycin (MYCIFRADIN) tablet 500 mg        500 mg Oral  Every 4 hours 05/08/22 2105 05/09/22 0123   05/08/22 2200  metroNIDAZOLE (FLAGYL) tablet 500 mg        500 mg Oral Every 4 hours 05/08/22 2105 05/09/22 0123       Assessment/Plan:  Advanced to clears, PT eval, 1upRBC as some symptomatic anemia, remain in house. OK for transfer to progressive or 4th med-surg pending staffing. Appreciate PT, ostomy RN, and case management help.    Brandy Wheeler 05/11/2022

## 2022-05-11 NOTE — Evaluation (Signed)
Physical Therapy Evaluation Patient Details Name: Brandy Wheeler MRN: [id number] DOB: 05/20/1931 Today's Date: 05/11/2022  History of Present Illness  Pt s/p lararoscopic complee cystect ileal conduit , bil pevic lymphadenectomy and ileal conduit urinary diversion 2* bladder CA.  Pt with L ankle fx 04/28/22 and NWB on same  Clinical Impression  Pt admitted as above and presenting with functional mobility limitations 2* post op pain, NWB on L LE and pain limited WB on R LE.  This date pt to EOB sitting and attempted standing x 2 with mod assist of 2 and RW.  Pt did maintain NWB on L LE but with noted buckling at R LE requiring significant assist to prevent falling. Pt would benefit from follow up rehab at SNF level to maximize IND and safety prior to return home with limited assist.       Recommendations for follow up therapy are one component of a multi-disciplinary discharge planning process, led by the attending physician.  Recommendations may be updated based on patient status, additional functional criteria and insurance authorization.  Follow Up Recommendations Skilled nursing-short term rehab (<3 hours/day) Can patient physically be transported by private vehicle: No    Assistance Recommended at Discharge Frequent or constant Supervision/Assistance  Patient can return home with the following  Two people to help with walking and/or transfers;A lot of help with bathing/dressing/bathroom;Assistance with cooking/housework;Assist for transportation;Help with stairs or ramp for entrance    Equipment Recommendations None recommended by PT  Recommendations for Other Services       Functional Status Assessment Patient has had a recent decline in their functional status and demonstrates the ability to make significant improvements in function in a reasonable and predictable amount of time.     Precautions / Restrictions Precautions Precautions: Fall Precaution Comments: NWB L LE and R LE  buckling with attempts to stand Restrictions Weight Bearing Restrictions: Yes LLE Weight Bearing: Non weight bearing      Mobility  Bed Mobility Overal bed mobility: Needs Assistance Bed Mobility: Rolling, Sidelying to Sit, Sit to Sidelying Rolling: Min assist Sidelying to sit: Min assist, Mod assist     Sit to sidelying: Min assist, Mod assist General bed mobility comments: cues for modified log roll to accommodate surgery and L anikle cast    Transfers Overall transfer level: Needs assistance   Transfers: Sit to/from Stand Sit to Stand: Mod assist, +2 physical assistance           General transfer comment: cues for LE management and use of UEs to self assist.  Physical assist to bring wt up and fwd to stand with RW.  Noted buckling at R LE 2* non-operative ankle pain    Ambulation/Gait               General Gait Details: unsafe to attempt at this time  Stairs            Wheelchair Mobility    Modified Rankin (Stroke Patients Only)       Balance Overall balance assessment: Needs assistance Sitting-balance support: Feet supported, No upper extremity supported Sitting balance-Leahy Scale: Good     Standing balance support: Bilateral upper extremity supported Standing balance-Leahy Scale: Poor                               Pertinent Vitals/Pain Pain Assessment Pain Assessment: Faces Faces Pain Scale: Hurts even more Pain Location: L ankle with internal rotation and  R ankle with WB Pain Intervention(s): Limited activity within patient's tolerance, Monitored during session    Home Living Family/patient expects to be discharged to:: Skilled nursing facility                        Prior Function Prior Level of Function : Needs assist             Mobility Comments: Pt was IND with mobility and ADL until recent L ankle fx and since has been doing pvts between chair, wc and comode       Hand Dominance         Extremity/Trunk Assessment   Upper Extremity Assessment Upper Extremity Assessment: Overall WFL for tasks assessed    Lower Extremity Assessment Lower Extremity Assessment: RLE deficits/detail;LLE deficits/detail RLE Deficits / Details: ankle ROM WFL but strength and WB tolerance pain limited LLE Deficits / Details: cast in place L foot and ankle    Cervical / Trunk Assessment Cervical / Trunk Assessment: Normal  Communication   Communication: HOH  Cognition Arousal/Alertness: Awake/alert Behavior During Therapy: WFL for tasks assessed/performed Overall Cognitive Status: Within Functional Limits for tasks assessed                                          General Comments      Exercises     Assessment/Plan    PT Assessment Patient needs continued PT services  PT Problem List Decreased range of motion;Decreased activity tolerance;Decreased balance;Decreased mobility;Decreased knowledge of use of DME;Decreased strength;Pain       PT Treatment Interventions DME instruction;Gait training;Stair training;Functional mobility training;Therapeutic activities;Therapeutic exercise;Patient/family education    PT Goals (Current goals can be found in the Care Plan section)  Acute Rehab PT Goals Patient Stated Goal: REgain IND PT Goal Formulation: With patient Time For Goal Achievement: 05/24/22 Potential to Achieve Goals: Fair    Frequency Min 3X/week     Co-evaluation               AM-PAC PT "6 Clicks" Mobility  Outcome Measure Help needed turning from your back to your side while in a flat bed without using bedrails?: A Little Help needed moving from lying on your back to sitting on the side of a flat bed without using bedrails?: A Lot Help needed moving to and from a bed to a chair (including a wheelchair)?: A Lot Help needed standing up from a chair using your arms (e.g., wheelchair or bedside chair)?: A Lot Help needed to walk in hospital room?:  Total Help needed climbing 3-5 steps with a railing? : Total 6 Click Score: 11    End of Session Equipment Utilized During Treatment: Gait belt Activity Tolerance: Patient tolerated treatment well Patient left: in bed;with call bell/phone within reach;with bed alarm set;with family/visitor present Nurse Communication: Mobility status PT Visit Diagnosis: Difficulty in walking, not elsewhere classified (R26.2);Pain Pain - part of body: Ankle and joints of foot    Time: 1450-1524 PT Time Calculation (min) (ACUTE ONLY): 34 min   Charges:   PT Evaluation $PT Eval Low Complexity: 1 Low PT Treatments $Therapeutic Activity: 8-22 mins        Debe Coder PT Acute Rehabilitation Services Pager [phone] Office [phone]   [facility] 05/11/2022, 3:43 PM

## 2022-05-12 LAB — BASIC METABOLIC PANEL
Anion gap: 4 — ABNORMAL LOW (ref 5–15)
BUN: 23 mg/dL (ref 8–23)
CO2: 26 mmol/L (ref 22–32)
Calcium: 10.1 mg/dL (ref 8.9–10.3)
Chloride: 101 mmol/L (ref 98–111)
Creatinine, Ser: 1.13 mg/dL — ABNORMAL HIGH (ref 0.44–1.00)
GFR, Estimated: 47 mL/min — ABNORMAL LOW (ref >60)
Glucose, Bld: 122 mg/dL — ABNORMAL HIGH (ref 70–99)
Potassium: 3.5 mmol/L (ref 3.5–5.1)
Sodium: 131 mmol/L — ABNORMAL LOW (ref 135–145)

## 2022-05-12 LAB — HEMOGLOBIN AND HEMATOCRIT, BLOOD
HCT: 26.2 % — ABNORMAL LOW (ref 36.0–46.0)
Hemoglobin: 8.4 g/dL — ABNORMAL LOW (ref 12.0–15.0)

## 2022-05-12 NOTE — Progress Notes (Signed)
3 Days Post-Op   Subjective/Chief Complaint:  1 - Aggressive Bladder Cancer - s/p robotic cystectomy / node dissection / conduit diversion 05/09/22. Path pending. Admitted 1/16 for bowel prep. Proph Lovenox (since non-ambulatory) started POD 1.  JP still has moderate output with 462m/24hr.  2 - Ileus - bowel anastamosis as part of planned urinary diversion above. NPO initiall post-op. Received entereg. Reglan added POD 1. Clears started POD 2.  Fulls started POD 3 as she has had 2 BM's and no distention or nausea.  3 - Acute on Chronic Anemia - hgb 9's on admit likely from some chronic hematuria and anemia of chronic disease. Hgb 8.4 after transfusion.  She remains tachycardia.  4 - Disposition / Rehab - independatn at baseline, but had recent left leg fracture and non-weight bearing for next month. Also new ostomy. Will likely need facility at DC.  PT eval pending. Ostomy RN team working with in house.  5 - AKI -  Her Cr has bumped to 1.38.  Her preop was 0.76.  Will reassess tomorrow. UOP is good.   Today "GKarlena is  stable. No emesis or fevers.  BM's x 2. Pain controlled. Has been sat up on edge of bed. HR stable but tach 105-120 at reset (was same pre-op).   Cr. Up to 1.38 and Hgb 8.4 which is up.   Objective: Vital signs in last 24 hours: Temp:  [98.1 F (36.7 C)-100.1 F (37.8 C)] 98.7 F (37.1 C) (01/20 0451) Pulse Rate:  [111-124] 119 (01/20 0451) Resp:  [15-20] 20 (01/20 0451) BP: (110-177)/(47-94) 162/72 (01/20 0451) SpO2:  [93 %-100 %] 96 % (01/20 0451) Weight:  [62.6 kg] 62.6 kg (01/20 0451) Last BM Date : 05/09/22  Intake/Output from previous day: 01/19 0701 - 01/20 0700 In: 465.9 [I.V.:178.4; Blood:287.5] Out: 1105 [Urine:700; Drains:405] Intake/Output this shift: No intake/output data recorded.  NAD in stepdown, fAOx3, very pleasant. Non-labored breathing on RA Stable regular tachy cardai at 120bpm by monitor SNTND, recent port and extraction sties c/d/I.  RLQ  Uroatomy pink/patent with Rt (red) and Lt (blue) bander stents and non-foul urine Flexiseal in place with scant output, removed.  RLE SCD in place. LLE in cast with stable mild foot edema  Lab Results:  Recent Labs    05/11/22 1822 05/12/22 0420  HGB 8.2* 8.4*  HCT 26.2* 26.2*   BMET Recent Labs    05/10/22 0441 05/11/22 0626  NA 131* 134*  K 3.7 3.5  CL 98 98  CO2 26 25  GLUCOSE 104* 93  BUN 24* 26*  CREATININE 1.16* 1.38*  CALCIUM 9.5 9.9   PT/INR No results for input(s): "LABPROT", "INR" in the last 72 hours. ABG No results for input(s): "PHART", "HCO3" in the last 72 hours.  Invalid input(s): "PCO2", "PO2"  Studies/Results: No results found.  Anti-infectives: Anti-infectives (From admission, onward)    Start     Dose/Rate Route Frequency Ordered Stop   05/09/22 2100  piperacillin-tazobactam (ZOSYN) IVPB 3.375 g        3.375 g 12.5 mL/hr over 240 Minutes Intravenous Every 8 hours 05/09/22 2048 05/10/22 1636   05/09/22 2054  piperacillin-tazobactam (ZOSYN) 3.375 GM/50ML IVPB       Note to Pharmacy: YNicholes RoughS: cabinet override      05/09/22 2054 05/09/22 2306   05/09/22 0627  piperacillin-tazobactam (ZOSYN) IVPB 3.375 g        3.375 g 100 mL/hr over 30 Minutes Intravenous 30 min pre-op 05/09/22 0627 05/09/22 0840  05/08/22 2200  neomycin (MYCIFRADIN) tablet 500 mg        500 mg Oral Every 4 hours 05/08/22 2105 05/09/22 0123   05/08/22 2200  metroNIDAZOLE (FLAGYL) tablet 500 mg        500 mg Oral Every 4 hours 05/08/22 2105 05/09/22 0123       Assessment/Plan:  Advanced to fulls, PT, follow Cr. OK for transfer to progressive or 4th med-surg pending staffing. Appreciate PT, ostomy RN, and case management help.    Irine Seal 1/20/2024Patient ID: Lenox Ahr, female   DOB: 07/12/1931, 89 y.o.   MRN: [id number]

## 2022-05-13 LAB — BASIC METABOLIC PANEL
Anion gap: 7 (ref 5–15)
BUN: 19 mg/dL (ref 8–23)
CO2: 28 mmol/L (ref 22–32)
Calcium: 10.4 mg/dL — ABNORMAL HIGH (ref 8.9–10.3)
Chloride: 99 mmol/L (ref 98–111)
Creatinine, Ser: 0.85 mg/dL (ref 0.44–1.00)
GFR, Estimated: >60 mL/min (ref >60)
Glucose, Bld: 113 mg/dL — ABNORMAL HIGH (ref 70–99)
Potassium: 3.2 mmol/L — ABNORMAL LOW (ref 3.5–5.1)
Sodium: 134 mmol/L — ABNORMAL LOW (ref 135–145)

## 2022-05-13 LAB — HEMOGLOBIN AND HEMATOCRIT, BLOOD
HCT: 26.6 % — ABNORMAL LOW (ref 36.0–46.0)
Hemoglobin: 8.5 g/dL — ABNORMAL LOW (ref 12.0–15.0)

## 2022-05-13 MED ORDER — POTASSIUM CHLORIDE CRYS ER 20 MEQ PO TBCR
20.0000 meq | EXTENDED_RELEASE_TABLET | Freq: Every day | ORAL | Status: DC
  Administered 2022-05-13 – 2022-05-17 (×5): 20 meq via ORAL
  Filled 2022-05-13 (×5): qty 1

## 2022-05-13 MED ORDER — CETIRIZINE HCL 10 MG PO TABS
10.0000 mg | ORAL_TABLET | Freq: Every day | ORAL | Status: AC | PRN
  Administered 2022-05-13 – 2022-05-16 (×4): 10 mg via ORAL
  Filled 2022-05-13 (×8): qty 1

## 2022-05-13 MED ORDER — NON FORMULARY: Freq: Every day | Status: DC | PRN

## 2022-05-13 MED ORDER — POTASSIUM CHLORIDE CRYS ER 20 MEQ PO TBCR: 20.0000 meq | EXTENDED_RELEASE_TABLET | Freq: Two times a day (BID) | ORAL | Status: DC

## 2022-05-13 NOTE — Progress Notes (Signed)
4 Days Post-Op   Subjective/Chief Complaint:  1 - Aggressive Bladder Cancer - s/p robotic cystectomy / node dissection / conduit diversion 05/09/22. Path pending. Admitted 1/16 for bowel prep. Proph Lovenox (since non-ambulatory) started POD 1.  JP still has moderate output with 463m/24hr.  2 - Ileus - bowel anastamosis as part of planned urinary diversion above. NPO initiall post-op. Received entereg. Reglan added POD 1. Clears started POD 2.  Fulls started POD 4 as she has had 2 BM's and no distention or nausea.  3 - Acute on Chronic Anemia - hgb 9's on admit likely from some chronic hematuria and anemia of chronic disease. Hgb 8.5 which is stable today.  She remains tachycardia.  4 - Disposition / Rehab - independatn at baseline, but had recent left leg fracture and non-weight bearing for next month. Also new ostomy. Will likely need facility at DC.  PT eval pending. Ostomy RN team working with in house.  5 - AKI -  resolved.  Cr is 0.85.  UOP is good.   6 - Hypokalemia - K is 3.2.  I will add 265m potassium daily.   Today "GeLorayneis  stable. No emesis or fevers.  Loose stools. Pain controlled. Has been sat up on edge of bed. HR stable today but she was up to 140 last night with a trip to the BR.   Cr. Down to 0.85.  K down to 3.2.  Hgb 8.5 which is minimally up.   Objective: Vital signs in last 24 hours: Temp:  [98.3 F (36.8 C)-99.4 F (37.4 C)] 98.8 F (37.1 C) (01/21 0455) Pulse Rate:  [110-123] 110 (01/21 0455) Resp:  [18-22] 22 (01/21 0455) BP: (171-178)/(76-81) 171/76 (01/21 0455) SpO2:  [94 %-96 %] 94 % (01/21 0455) Last BM Date : 05/12/22  Intake/Output from previous day: 01/20 0701 - 01/21 0700 In: 2868.1 [P.O.:570; I.V.:2298.1] Out: 1960 [Urine:1400; Drains:560] Intake/Output this shift: Total I/O In: -  Out: 50 [Drains:50]  NAD in stepdown, fAOx3, very pleasant. Non-labored breathing on RA Stable regular tachy cardai at 110bpm by monitor SNTND, recent port  and extraction sties c/d/I.  RLQ Uroatomy pink/patent with Rt (red) and Lt (blue) bander stents and non-foul urine. JP in place with moderate output. Flexiseal in place with scant output, removed.  RLE SCD in place. LLE in cast  Lab Results:  Recent Labs    05/12/22 0420 05/13/22 0444  HGB 8.4* 8.5*  HCT 26.2* 26.6*   BMET Recent Labs    05/12/22 0420 05/13/22 0444  NA 131* 134*  K 3.5 3.2*  CL 101 99  CO2 26 28  GLUCOSE 122* 113*  BUN 23 19  CREATININE 1.13* 0.85  CALCIUM 10.1 10.4*   PT/INR No results for input(s): "LABPROT", "INR" in the last 72 hours. ABG No results for input(s): "PHART", "HCO3" in the last 72 hours.  Invalid input(s): "PCO2", "PO2"  Studies/Results: No results found.  Anti-infectives: Anti-infectives (From admission, onward)    Start     Dose/Rate Route Frequency Ordered Stop   05/09/22 2100  piperacillin-tazobactam (ZOSYN) IVPB 3.375 g        3.375 g 12.5 mL/hr over 240 Minutes Intravenous Every 8 hours 05/09/22 2048 05/10/22 1636   05/09/22 2054  piperacillin-tazobactam (ZOSYN) 3.375 GM/50ML IVPB       Note to Pharmacy: YuNicholes Rough: cabinet override      05/09/22 2054 05/09/22 2306   05/09/22 0627  piperacillin-tazobactam (ZOSYN) IVPB 3.375 g  3.375 g 100 mL/hr over 30 Minutes Intravenous 30 min pre-op 05/09/22 0627 05/09/22 0840   05/08/22 2200  neomycin (MYCIFRADIN) tablet 500 mg        500 mg Oral Every 4 hours 05/08/22 2105 05/09/22 0123   05/08/22 2200  metroNIDAZOLE (FLAGYL) tablet 500 mg        500 mg Oral Every 4 hours 05/08/22 2105 05/09/22 0123       Assessment/Plan:  Advance to fulls,  Add potassium chloride 63mq daily,  Zyrtec ordered for congestion,  PT,.Appreciate PT, ostomy RN, and case management help.    JIrine Seal1/21/2024Patient ID: GLenox Ahr female   DOB: 617-Oct-1933 89y.o.   MRN: [id number] ID: GBrynnly Bonet female   DOB: [birth year] 89y.o.   MRN: [id number]

## 2022-05-13 NOTE — Plan of Care (Signed)

## 2022-05-14 LAB — BASIC METABOLIC PANEL
Anion gap: 7 (ref 5–15)
BUN: 18 mg/dL (ref 8–23)
CO2: 26 mmol/L (ref 22–32)
Calcium: 9.6 mg/dL (ref 8.9–10.3)
Chloride: 101 mmol/L (ref 98–111)
Creatinine, Ser: 0.68 mg/dL (ref 0.44–1.00)
GFR, Estimated: >60 mL/min (ref >60)
Glucose, Bld: 111 mg/dL — ABNORMAL HIGH (ref 70–99)
Potassium: 2.8 mmol/L — ABNORMAL LOW (ref 3.5–5.1)
Sodium: 134 mmol/L — ABNORMAL LOW (ref 135–145)

## 2022-05-14 LAB — TYPE AND SCREEN
ABO/RH(D): A POS
Antibody Screen: NEGATIVE
Unit division: 0
Unit division: 0

## 2022-05-14 LAB — HEMOGLOBIN AND HEMATOCRIT, BLOOD
HCT: 26.6 % — ABNORMAL LOW (ref 36.0–46.0)
Hemoglobin: 8.5 g/dL — ABNORMAL LOW (ref 12.0–15.0)

## 2022-05-14 LAB — BPAM RBC
Blood Product Expiration Date: 202402112359
Blood Product Expiration Date: 202402112359
ISSUE DATE / TIME: 202401190840
Unit Type and Rh: 6200
Unit Type and Rh: 6200

## 2022-05-14 LAB — CREATININE, FLUID (PLEURAL, PERITONEAL, JP DRAINAGE): Creat, Fluid: 0.8 mg/dL

## 2022-05-14 MED ORDER — ORAL CARE MOUTH RINSE: 15.0000 mL | OROMUCOSAL | Status: DC | PRN

## 2022-05-14 NOTE — Care Management Important Message (Signed)
Important Message  Patient Details IM Letter given. Name: Brandy Wheeler MRN: [id number] Date of Birth: 09-Mar-1932   Medicare Important Message Given:  Yes     Kerin Salen 05/14/2022, 11:31 AM

## 2022-05-14 NOTE — Consult Note (Addendum)
[facility] Nurse ostomy consult note Pt's daughter and Yolanda Bonine present for ostomy pouch change and teaching session. Pt watched using a hand held mirror.  Stoma type/location: Stoma is red and viable, slightly above skin level, 1 1/8 inches, visible urine leaking around suture edges at 9:00 o'clock.  There is a valley located at 3:00 o'clock and 9:00 o'clock.  2 stints in place. Peristomal assessment: intact skin surrounding, previous pouch was leaking behind the barrier at 9:00 o'clock Output: mod amt yellow urine.  Ostomy pouching: 1pc.  Education provided:  Demonstrated pouch change using one piece convex pouch and barrier ring.  Daughter was able to shape barrier ring and assist with pouch application.  She was able to open and close the spout to empty, and connect and unattach from the bedside drainage pouch. Discussed ordering supplies and pouching routines. 5 sets of each supply left in the room, along with educational materials.  Use supplies: barrier rings, Kellie Simmering [phone] and convex pouch [place] # [phone] Enrolled patient in [place] program: Yes,  previously Pt plans to discharge to SNF and have further assistance with pouch changes and emptying. Thank-you,  Julien Girt MSN, [place], [place], [place], [place]

## 2022-05-14 NOTE — Plan of Care (Signed)

## 2022-05-14 NOTE — Progress Notes (Signed)
5 Days Post-Op   Subjective/Chief Complaint:  1 - Aggressive Bladder Cancer - s/p robotic cystectomy / node dissection / conduit diversion 05/09/22. Path pending. Admitted 1/16 for bowel prep. Proph Lovenox (since non-ambulatory) started POD 1.   2 - Ileus - bowel anastamosis as part of planned urinary diversion above. NPO initiall post-op. Received entereg. Reglan added POD 1. Clears started POD 2. Resumed bowel function POD 3. Advanced to fulls POD 4, then regular POD 5.   3 - Acute on Chronic Anemia - hgb 9's on admit likely from some chronic hematuria and anemia of chronic disease. Hgb 7's post-op but with some tachycardia. Given 1upRBC and Hgb stable mid 8s with improved tachycardia.   4 - Disposition / Rehab - independatn at baseline, but had recent left leg fracture and non-weight bearing for next month. Also new ostomy.   PT eval recs SNF. Ostomy RN team working with in house.  Today "Lynzie" is  continuing to improve. Hgb stable, tolerating fulls. PT eval recs SNF.    Objective: Vital signs in last 24 hours: Temp:  [98.3 F (36.8 C)-98.8 F (37.1 C)] 98.3 F (36.8 C) (01/22 0514) Pulse Rate:  [106-126] 107 (01/22 0614) Resp:  [17-21] 17 (01/22 0514) BP: (156-183)/(66-84) 156/84 (01/22 0614) SpO2:  [94 %-96 %] 95 % (01/22 0514) Last BM Date : 05/12/22  Intake/Output from previous day: 01/21 0701 - 01/22 0700 In: 120 [P.O.:120] Out: 1595 [Urine:1200; Drains:395] Intake/Output this shift: No intake/output data recorded.  NAD, AOx3, very pleasant. Two daughters at bedside. Non-labored breathing on RA Improved regular tachycardia at 100bpm by monitor SNTND, recent port and extraction sties c/d/I.  RLQ Uroatomy pink/patent with Rt (red) and Lt (blue) bander stents and non-foul urine RLE SCD in place. LLE in cast with stable mild foot edema  Lab Results:  Recent Labs    05/13/22 0444 05/14/22 0402  HGB 8.5* 8.5*  HCT 26.6* 26.6*   BMET Recent Labs    05/13/22 0444  05/14/22 0402  NA 134* 134*  K 3.2* 2.8*  CL 99 101  CO2 28 26  GLUCOSE 113* 111*  BUN 19 18  CREATININE 0.85 0.68  CALCIUM 10.4* 9.6   PT/INR No results for input(s): "LABPROT", "INR" in the last 72 hours. ABG No results for input(s): "PHART", "HCO3" in the last 72 hours.  Invalid input(s): "PCO2", "PO2"  Studies/Results: No results found.  Anti-infectives: Anti-infectives (From admission, onward)    Start     Dose/Rate Route Frequency Ordered Stop   05/09/22 2100  piperacillin-tazobactam (ZOSYN) IVPB 3.375 g        3.375 g 12.5 mL/hr over 240 Minutes Intravenous Every 8 hours 05/09/22 2048 05/10/22 1636   05/09/22 2054  piperacillin-tazobactam (ZOSYN) 3.375 GM/50ML IVPB       Note to Pharmacy: Nicholes Rough S: cabinet override      05/09/22 2054 05/09/22 2306   05/09/22 0627  piperacillin-tazobactam (ZOSYN) IVPB 3.375 g        3.375 g 100 mL/hr over 30 Minutes Intravenous 30 min pre-op 05/09/22 0627 05/09/22 0840   05/08/22 2200  neomycin (MYCIFRADIN) tablet 500 mg        500 mg Oral Every 4 hours 05/08/22 2105 05/09/22 0123   05/08/22 2200  metroNIDAZOLE (FLAGYL) tablet 500 mg        500 mg Oral Every 4 hours 05/08/22 2105 05/09/22 0123       Assessment/Plan:  Doing well POD 5. SLIV, adv to reg diet, case  management consult to begin workign on SNF placement, Goals for DC discussed. Based on current progress likely DC 1/24.   Appreciate PT, case management, ostomy RN.    [name] 05/14/2022

## 2022-05-14 NOTE — Consult Note (Addendum)
[facility] Nurse ostomy follow up Called patient's daughter, [name] to determine when to perform a pouch change demonstartion and teaching session. She states she will come to the hospital today at 2:30. Thank-you,  Julien Girt MSN, [place], [place], [place], [place]

## 2022-05-14 NOTE — TOC Progression Note (Signed)
Transition of Care [facility]) - Progression Note    Patient Details  Name: Brandy Wheeler MRN: [id number] Date of Birth: 1931/07/04  Transition of Care [place]) CM/SW Seth Ward, LCSW Phone Number: 05/14/2022, 12:43 PM  Clinical Narrative:    CSW spoke with pt's about recommendations for rehab. Pt is in agreeable for short term rehab.CSW explained the process for SNF placement.  Pt requested CSW to contact her daughter to provided update. CSW spoke with pt's daughter [name] and provided update. CSW will work pt up for rehab. TOC to follow.      Barriers to Discharge: Continued Medical Work up  Expected Discharge Plan and Services In-house Referral: NA Discharge Planning Services: CM Consult   Living arrangements for the past 2 months: Single Family Home                 DME Arranged: N/A DME Agency: NA       HH Arranged: NA HH Agency: NA         Social Determinants of Health (SDOH) Interventions SDOH Screenings   Food Insecurity: No Food Insecurity (05/08/2022)  Housing: Low Risk  (05/08/2022)  Transportation Needs: No Transportation Needs (05/08/2022)  Utilities: Not At Risk (05/08/2022)  Tobacco Use: Low Risk  (05/10/2022)    Readmission Risk Interventions     No data to display

## 2022-05-14 NOTE — [place] FL2 (Signed)
[place] MEDICAID FL2 LEVEL OF CARE FORM     IDENTIFICATION  Patient Name: Brandy Wheeler Birthdate: 11-29-1931 Sex: female Admission Date (Current Location): 05/08/2022  [facility] and [place] Number:  Herbalist and Address:  [facility],  [address], [place]      Provider Number: [id number]  Attending Physician Name and Address:  [name], MD  Relative Name and Phone Number:  Gae Dry (Daughter) [phone] [facility])    Current Level of Care: Hospital Recommended Level of Care: [place] Prior Approval Number:    Date Approved/Denied:   PASRR Number: [id number]  Discharge Plan: SNF    Current Diagnoses: Patient Active Problem List   Diagnosis Date Noted   Malnutrition of moderate degree 05/11/2022   Surgery, elective 05/09/2022   Bladder cancer ([place]) 05/09/2022   Health education 04/30/2022   Encounter for ostomy care education 04/11/2022   Attention to urostomy [facility]) 04/11/2022   Port-A-Cath in place 11/16/2021   Malignant neoplasm of urinary bladder ([place]) 11/03/2021   Bladder tumor 10/09/2021   Fracture of distal fibula 10/19/2013    Orientation RESPIRATION BLADDER Height & Weight     Self, Time, Situation, Place  Normal Continent Weight: 138 lb 0.1 oz (62.6 kg) Height:  '5\' 3"'$  (160 cm)  BEHAVIORAL SYMPTOMS/MOOD NEUROLOGICAL BOWEL NUTRITION STATUS      Continent Diet (regular)  AMBULATORY STATUS COMMUNICATION OF NEEDS Skin   Extensive Assist Verbally Other (Comment) (closed incision-vagina/umbilicus upper)                       Personal Care Assistance Level of Assistance  Bathing, Feeding, Dressing Bathing Assistance: Limited assistance Feeding assistance: Independent Dressing Assistance: Limited assistance     Functional Limitations Info  Sight, Hearing, Speech Sight Info: Adequate Hearing Info: Adequate Speech Info: Adequate    SPECIAL CARE FACTORS FREQUENCY  PT (By licensed PT),  OT (By licensed OT)     PT Frequency: 5 x a week OT Frequency: 5 x a week            Contractures Contractures Info: Not present    Additional Factors Info  Code Status, Allergies Code Status Info: full Allergies Info: NKA           Current Medications (05/14/2022):  This is the current hospital active medication list Current Facility-Administered Medications  Medication Dose Route Frequency Provider Last Rate Last Admin   cetirizine (ZYRTEC) tablet 10 mg  10 mg Oral Daily PRN Irine Seal, MD   10 mg at 05/13/22 1540   Chlorhexidine Gluconate Cloth 2 % PADS 6 each  6 each Topical Daily [name], MD   6 each at 05/14/22 0920   diphenhydrAMINE (BENADRYL) injection 12.5 mg  12.5 mg Intravenous Q6H PRN Debbrah Alar, PA-C       Or   diphenhydrAMINE (BENADRYL) 12.5 MG/5ML elixir 12.5 mg  12.5 mg Oral Q6H PRN Dancy, Amanda, PA-C       enoxaparin (LOVENOX) injection 30 mg  30 mg Subcutaneous Q24H [name], MD   30 mg at 05/13/22 1539   HYDROmorphone (DILAUDID) injection 0.5-1 mg  0.5-1 mg Intravenous Q2H PRN Debbrah Alar, PA-C   1 mg at 05/11/22 1031   metoCLOPramide (REGLAN) injection 10 mg  10 mg Intravenous Marella Chimes, MD   10 mg at 05/14/22 0916   ondansetron (ZOFRAN) injection 4 mg  4 mg Intravenous Q4H PRN Debbrah Alar, PA-C  Oral care mouth rinse  15 mL Mouth Rinse PRN [name], MD       oxyCODONE (Oxy IR/ROXICODONE) immediate release tablet 5 mg  5 mg Oral Q4H PRN Debbrah Alar, PA-C   5 mg at 05/13/22 1755   pantoprazole (PROTONIX) EC tablet 40 mg  40 mg Oral Daily [name], MD   40 mg at 05/14/22 0916   potassium chloride SA (KLOR-CON M) CR tablet 20 mEq  20 mEq Oral Daily Irine Seal, MD   20 mEq at 05/14/22 5320   senna-docusate (Senokot-S) tablet 2 tablet  2 tablet Oral QHS Debbrah Alar, PA-C   2 tablet at 05/13/22 2223     Discharge Medications: Please see discharge summary for a list of discharge medications.  Relevant  Imaging Results:  Relevant Lab Results:   Additional Information [phone]  [place], LCSW

## 2022-05-15 LAB — SURGICAL PATHOLOGY

## 2022-05-15 NOTE — Progress Notes (Signed)
6 Days Post-Op   Subjective/Chief Complaint:  1 - Aggressive Bladder Cancer - s/p robotic cystectomy / node dissection / conduit diversion 05/09/22. Path pending. Admitted 1/16 for bowel prep. Proph Lovenox (since non-ambulatory) started POD 1. JP Cr same as serum 1/23.   2 - Ileus - bowel anastamosis as part of planned urinary diversion above. NPO initiall post-op. Received entereg. Reglan added POD 1. Clears started POD 2. Resumed bowel function POD 3. Advanced to fulls POD 4, then regular POD 5.   3 - Acute on Chronic Anemia - hgb 9's on admit likely from some chronic hematuria and anemia of chronic disease. Hgb 7's post-op but with some tachycardia. Given 1upRBC and Hgb stable mid 8s with improved tachycardia.   4 - Disposition / Rehab - independatn at baseline, but had recent left leg fracture and non-weight bearing for next month. Also new ostomy.   PT eval recs SNF. Ostomy RN team working with in house.  Today "Yessenia" is stable. Worked more with PT today. List of rehab options given to family. JP Cr same as serum, continues to tolerate reg diet.   Objective: Vital signs in last 24 hours: Temp:  [98.1 F (36.7 C)-98.7 F (37.1 C)] 98.1 F (36.7 C) (01/23 1213) Pulse Rate:  [107-110] 110 (01/23 1213) Resp:  [16-18] 16 (01/23 1213) BP: (158-167)/(65-70) 158/65 (01/23 1213) SpO2:  [93 %-97 %] 97 % (01/23 1213) Last BM Date : 05/14/22  Intake/Output from previous day: 01/22 0701 - 01/23 0700 In: 540 [P.O.:540] Out: 2280 [Urine:1900; Drains:380] Intake/Output this shift: Total I/O In: -  Out: 670 [Urine:550; Drains:120]   NAD, AOx3, very pleasant. Resting this afternoon.  Non-labored breathing on RA Improved regular tachycardia  SNTND, recent port and extraction sties c/d/I.  RLQ Uroatomy pink/patent with Rt (red) and Lt (blue) bander stents and non-foul urine RLE SCD in place. LLE in cast with stable mild foot edema  Lab Results:  Recent Labs    05/13/22 0444  05/14/22 0402  HGB 8.5* 8.5*  HCT 26.6* 26.6*   BMET Recent Labs    05/13/22 0444 05/14/22 0402  NA 134* 134*  K 3.2* 2.8*  CL 99 101  CO2 28 26  GLUCOSE 113* 111*  BUN 19 18  CREATININE 0.85 0.68  CALCIUM 10.4* 9.6   PT/INR No results for input(s): "LABPROT", "INR" in the last 72 hours. ABG No results for input(s): "PHART", "HCO3" in the last 72 hours.  Invalid input(s): "PCO2", "PO2"  Studies/Results: No results found.  Anti-infectives: Anti-infectives (From admission, onward)    Start     Dose/Rate Route Frequency Ordered Stop   05/09/22 2100  piperacillin-tazobactam (ZOSYN) IVPB 3.375 g        3.375 g 12.5 mL/hr over 240 Minutes Intravenous Every 8 hours 05/09/22 2048 05/10/22 1636   05/09/22 2054  piperacillin-tazobactam (ZOSYN) 3.375 GM/50ML IVPB       Note to Pharmacy: Nicholes Rough S: cabinet override      05/09/22 2054 05/09/22 2306   05/09/22 0627  piperacillin-tazobactam (ZOSYN) IVPB 3.375 g        3.375 g 100 mL/hr over 30 Minutes Intravenous 30 min pre-op 05/09/22 0627 05/09/22 0840   05/08/22 2200  neomycin (MYCIFRADIN) tablet 500 mg        500 mg Oral Every 4 hours 05/08/22 2105 05/09/22 0123   05/08/22 2200  metroNIDAZOLE (FLAGYL) tablet 500 mg        500 mg Oral Every 4 hours 05/08/22 2105 05/09/22  0123       Assessment/Plan:  Doing well POD 6. I feel OK for transfer to rehab at anypoint. Appreciate PT, case management, ostomy RN teams.   [name] 05/15/2022

## 2022-05-15 NOTE — TOC Progression Note (Addendum)
Transition of Care [facility]) - Progression Note    Patient Details  Name: Brandy Wheeler MRN: [id number] Date of Birth: June 29, 1931  Transition of Care [facility]) CM/SW [place], LCSW Phone Number: 05/15/2022, 9:49 AM  Clinical Narrative:    CSW presented bed offers to pt's daughter , she is requesting time to look over. TOC to follow.   ADDEN 2:30pm  CSW spoke with pt's daughter [name], they have accepted [employer]. Facility to start insurance auth. TOC to follow.   Barriers to Discharge: Continued Medical Work up  Expected Discharge Plan and Services In-house Referral: NA Discharge Planning Services: CM Consult   Living arrangements for the past 2 months: Single Family Home                 DME Arranged: N/A DME Agency: NA       HH Arranged: NA HH Agency: NA         Social Determinants of Health (SDOH) Interventions SDOH Screenings   Food Insecurity: No Food Insecurity (05/08/2022)  Housing: Low Risk  (05/08/2022)  Transportation Needs: No Transportation Needs (05/08/2022)  Utilities: Not At Risk (05/08/2022)  Tobacco Use: Low Risk  (05/10/2022)    Readmission Risk Interventions     No data to display

## 2022-05-15 NOTE — Progress Notes (Signed)
Physical Therapy Treatment Patient Details Name: Brandy Wheeler MRN: [id number] DOB: 1931-09-10 Today's Date: 05/15/2022   History of Present Illness Pt s/p lararoscopic complee cystect ileal conduit , bil pevic lymphadenectomy and ileal conduit urinary diversion 2* bladder CA.  Pt with L ankle fx 04/28/22 and NWB on same    PT Comments    Pt supine in bed, agreeable to mobilize. Pt needing increased time and cues with bed mobility, min A to roll to side to protect abdomen and mod A to push trunk up into sitting while clearing BLE off of bed. Educated pt on hand placement with transfers, pt preferring to pull from braced RW. Pt powers to stand with mod A+2 to rise and steady. Pt attempted to clear R foot to take a step with BUE on RW but unable to clear R foot, begins to fatigue with R knee buckling so returned to sitting EOB. Assisted pt to supine mod A+2 for trunk and BLE management. Pt reports fatigue once supine, politely declines exercises; RN in room to assist with self care.   Recommendations for follow up therapy are one component of a multi-disciplinary discharge planning process, led by the attending physician.  Recommendations may be updated based on patient status, additional functional criteria and insurance authorization.  Follow Up Recommendations  Skilled nursing-short term rehab (<3 hours/day) Can patient physically be transported by private vehicle: No   Assistance Recommended at Discharge Frequent or constant Supervision/Assistance  Patient can return home with the following Two people to help with walking and/or transfers;A lot of help with bathing/dressing/bathroom;Assistance with cooking/housework;Assist for transportation;Help with stairs or ramp for entrance   Equipment Recommendations  None recommended by PT    Recommendations for Other Services       Precautions / Restrictions Precautions Precautions: Fall Restrictions Weight Bearing Restrictions: Yes LLE Weight  Bearing: Non weight bearing     Mobility  Bed Mobility Overal bed mobility: Needs Assistance Bed Mobility: Rolling, Sidelying to Sit, Sit to Sidelying Rolling: Min assist Sidelying to sit: Mod assist  Sit to sidelying: Mod assist, +2 for physical assistance General bed mobility comments: mod A for powering to sitting from sidelying via log roll, mod A+2 for trunk management and BLE lifting back into bed    Transfers Overall transfer level: Needs assistance Equipment used: Rolling walker (2 wheels) Transfers: Sit to/from Stand Sit to Stand: Mod assist, +2 physical assistance, From elevated surface  General transfer comment: mod A+2 to power to stand on RLE with LLE NWB, able to complete 2 reps with assist, fatigues with static standing needing seated rest break after ~2 minutes. Attempted to clear R foot from floor with BUE on RW but pt unable and R knee began buckling so returned to sitting    Ambulation/Gait        Stairs             Wheelchair Mobility    Modified Rankin (Stroke Patients Only)       Balance Overall balance assessment: Needs assistance Sitting-balance support: Feet supported, No upper extremity supported Sitting balance-Leahy Scale: Good  Standing balance support: Bilateral upper extremity supported, Reliant on assistive device for balance, During functional activity Standing balance-Leahy Scale: Poor     Cognition Arousal/Alertness: Awake/alert Behavior During Therapy: WFL for tasks assessed/performed Overall Cognitive Status: Within Functional Limits for tasks assessed     Exercises      General Comments        Pertinent Vitals/Pain Pain Assessment Pain Assessment: Faces  Faces Pain Scale: Hurts little more Pain Location: L ankle with sliding leg to EOB Pain Descriptors / Indicators: Discomfort Pain Intervention(s): Limited activity within patient's tolerance, Monitored during session    Home Living                           Prior Function            PT Goals (current goals can now be found in the care plan section) Acute Rehab PT Goals Patient Stated Goal: REgain IND PT Goal Formulation: With patient Time For Goal Achievement: 05/24/22 Potential to Achieve Goals: Fair Progress towards PT goals: Progressing toward goals    Frequency    Min 3X/week      PT Plan Current plan remains appropriate    Co-evaluation              AM-PAC PT "6 Clicks" Mobility   Outcome Measure  Help needed turning from your back to your side while in a flat bed without using bedrails?: A Little Help needed moving from lying on your back to sitting on the side of a flat bed without using bedrails?: A Lot Help needed moving to and from a bed to a chair (including a wheelchair)?: Total Help needed standing up from a chair using your arms (e.g., wheelchair or bedside chair)?: A Lot Help needed to walk in hospital room?: Total Help needed climbing 3-5 steps with a railing? : Total 6 Click Score: 10    End of Session Equipment Utilized During Treatment: Gait belt Activity Tolerance: Patient tolerated treatment well Patient left: in bed;with call bell/phone within reach;with bed alarm set;with nursing/sitter in room Nurse Communication: Mobility status PT Visit Diagnosis: Difficulty in walking, not elsewhere classified (R26.2);Pain Pain - part of body: Ankle and joints of foot     Time: 1342-1405 PT Time Calculation (min) (ACUTE ONLY): 23 min  Charges:  $Therapeutic Activity: 23-37 mins                     Tori Noah Lembke PT, DPT 05/15/22, 3:26 PM

## 2022-05-16 NOTE — Plan of Care (Signed)
  Problem: Education: Goal: Knowledge of General Education information will improve Description: Including pain rating scale, medication(s)/side effects and non-pharmacologic comfort measures Outcome: Progressing   Problem: Health Behavior/Discharge Planning: Goal: Ability to manage health-related needs will improve Outcome: Progressing

## 2022-05-16 NOTE — Progress Notes (Signed)
7 Days Post-Op   Subjective/Chief Complaint:  1 - Oligometastatic Bladder Cancer - s/p robotic cystectomy / node dissection / conduit diversion 1/17/2 for pT4N1Mx high grade urothelial cancer. Margins negative. Admitted 1/16 for bowel prep. Proph Lovenox (since non-ambulatory) started POD 1. JP Cr same as serum 1/23.   2 - Ileus - bowel anastamosis as part of planned urinary diversion above. NPO initiall post-op. Received entereg. Reglan added POD 1. Clears started POD 2. Resumed bowel function POD 3. Advanced to fulls POD 4, then regular POD 5.   3 - Acute on Chronic Anemia - hgb 9's on admit likely from some chronic hematuria and anemia of chronic disease. Hgb 7's post-op but with some tachycardia. Given 1upRBC and Hgb stable mid 8s with improved tachycardia.   4 - Disposition / Rehab - independatn at baseline, but had recent left leg fracture and non-weight bearing for next month. Also new ostomy.   PT eval recs SNF. Ostomy RN team working with in house. SNF placement just pending insurance authorization.   Today "Brandy Wheeler" is stable. Pt and family have selected SNF facility and awaiting insurance authorization. Final path aggressive as expected with pT4N1 disease (one node, 66m focus) with negative margins.    Objective: Vital signs in last 24 hours: Temp:  [98.1 F (36.7 C)-99.6 F (37.6 C)] 99.6 F (37.6 C) (01/24 1357) Pulse Rate:  [107-110] 108 (01/24 1357) Resp:  [17-20] 18 (01/24 1357) BP: (153-166)/(65-73) 153/65 (01/24 1357) SpO2:  [93 %-96 %] 96 % (01/24 1357) Last BM Date : 05/14/22  Intake/Output from previous day: 01/23 0701 - 01/24 0700 In: 120 [P.O.:120] Out: 1100 [Urine:800; Drains:300] Intake/Output this shift: Total I/O In: -  Out: 180 [Drains:180]   NAD, AOx3, very pleasant. Sleepy but arousable and talkative.  Non-labored breathing on RA Improved regular tachycardia  SNTND, recent port and extraction sties c/d/I.  RLQ Uroatomy pink/patent with Rt (red)  and Lt (blue) bander stents and non-foul urine RLE SCD in place. LLE in cast with stable mild foot edema  Lab Results:  Recent Labs    05/14/22 0402  HGB 8.5*  HCT 26.6*   BMET Recent Labs    05/14/22 0402  NA 134*  K 2.8*  CL 101  CO2 26  GLUCOSE 111*  BUN 18  CREATININE 0.68  CALCIUM 9.6   PT/INR No results for input(s): "LABPROT", "INR" in the last 72 hours. ABG No results for input(s): "PHART", "HCO3" in the last 72 hours.  Invalid input(s): "PCO2", "PO2"  Studies/Results: No results found.  Anti-infectives: Anti-infectives (From admission, onward)    Start     Dose/Rate Route Frequency Ordered Stop   05/09/22 2100  piperacillin-tazobactam (ZOSYN) IVPB 3.375 g        3.375 g 12.5 mL/hr over 240 Minutes Intravenous Every 8 hours 05/09/22 2048 05/10/22 1636   05/09/22 2054  piperacillin-tazobactam (ZOSYN) 3.375 GM/50ML IVPB       Note to Pharmacy: YNicholes RoughS: cabinet override      05/09/22 2054 05/09/22 2306   05/09/22 0627  piperacillin-tazobactam (ZOSYN) IVPB 3.375 g        3.375 g 100 mL/hr over 30 Minutes Intravenous 30 min pre-op 05/09/22 0627 05/09/22 0840   05/08/22 2200  neomycin (MYCIFRADIN) tablet 500 mg        500 mg Oral Every 4 hours 05/08/22 2105 05/09/22 0123   05/08/22 2200  metroNIDAZOLE (FLAGYL) tablet 500 mg        500 mg Oral Every  4 hours 05/08/22 2105 05/09/22 0123       Assessment/Plan:  Doing well POD 7. Agree with SNF placement at anytime.  Appreciate PT, case management, ostomy RN teams.    [name] 05/16/2022

## 2022-05-16 NOTE — TOC Progression Note (Signed)
Transition of Care [facility]) - Progression Note    Patient Details  Name: Brandy Wheeler MRN: [id number] Date of Birth: 10-Jul-1931  [facility] of [facility]) CM/SW [place], LCSW Phone Number: 05/16/2022, 1:36 PM  Clinical Narrative:     CSW spoke with pt's daughter and provided update on pt's insurance auth.TOC to follow.     Barriers to Discharge: Continued Medical Work up  Expected Discharge Plan and Services In-house Referral: NA Discharge Planning Services: CM Consult   Living arrangements for the past 2 months: Single Family Home                 DME Arranged: N/A DME Agency: NA       HH Arranged: NA HH Agency: NA         Social Determinants of Health (SDOH) Interventions SDOH Screenings   Food Insecurity: No Food Insecurity (05/08/2022)  Housing: Low Risk  (05/08/2022)  Transportation Needs: No Transportation Needs (05/08/2022)  Utilities: Not At Risk (05/08/2022)  Tobacco Use: Low Risk  (05/10/2022)    Readmission Risk Interventions     No data to display

## 2022-05-17 DIAGNOSIS — Z743 Need for continuous supervision: Secondary | ICD-10-CM | POA: Diagnosis not present

## 2022-05-17 DIAGNOSIS — Z719 Counseling, unspecified: Secondary | ICD-10-CM | POA: Diagnosis not present

## 2022-05-17 DIAGNOSIS — R278 Other lack of coordination: Secondary | ICD-10-CM | POA: Diagnosis not present

## 2022-05-17 DIAGNOSIS — M6281 Muscle weakness (generalized): Secondary | ICD-10-CM | POA: Diagnosis not present

## 2022-05-17 DIAGNOSIS — K219 Gastro-esophageal reflux disease without esophagitis: Secondary | ICD-10-CM | POA: Diagnosis not present

## 2022-05-17 DIAGNOSIS — R410 Disorientation, unspecified: Secondary | ICD-10-CM | POA: Diagnosis not present

## 2022-05-17 DIAGNOSIS — Z7401 Bed confinement status: Secondary | ICD-10-CM | POA: Diagnosis not present

## 2022-05-17 DIAGNOSIS — Z419 Encounter for procedure for purposes other than remedying health state, unspecified: Secondary | ICD-10-CM | POA: Diagnosis not present

## 2022-05-17 DIAGNOSIS — Z436 Encounter for attention to other artificial openings of urinary tract: Secondary | ICD-10-CM | POA: Diagnosis not present

## 2022-05-17 DIAGNOSIS — R2689 Other abnormalities of gait and mobility: Secondary | ICD-10-CM | POA: Diagnosis not present

## 2022-05-17 DIAGNOSIS — R41841 Cognitive communication deficit: Secondary | ICD-10-CM | POA: Diagnosis not present

## 2022-05-17 DIAGNOSIS — S82899A Other fracture of unspecified lower leg, initial encounter for closed fracture: Secondary | ICD-10-CM | POA: Diagnosis not present

## 2022-05-17 DIAGNOSIS — Z95828 Presence of other vascular implants and grafts: Secondary | ICD-10-CM | POA: Diagnosis not present

## 2022-05-17 DIAGNOSIS — Z7189 Other specified counseling: Secondary | ICD-10-CM | POA: Diagnosis not present

## 2022-05-17 DIAGNOSIS — C679 Malignant neoplasm of bladder, unspecified: Secondary | ICD-10-CM | POA: Diagnosis not present

## 2022-05-17 DIAGNOSIS — E440 Moderate protein-calorie malnutrition: Secondary | ICD-10-CM | POA: Diagnosis not present

## 2022-05-17 DIAGNOSIS — D494 Neoplasm of unspecified behavior of bladder: Secondary | ICD-10-CM | POA: Diagnosis not present

## 2022-05-17 MED ORDER — OXYCODONE-ACETAMINOPHEN 5-325 MG PO TABS: 1.0000 | ORAL_TABLET | Freq: Four times a day (QID) | ORAL | 0 refills | Status: DC | PRN

## 2022-05-17 MED ORDER — HEPARIN SOD (PORK) LOCK FLUSH 100 UNIT/ML IV SOLN
500.0000 [IU] | INTRAVENOUS | Status: AC | PRN
  Administered 2022-05-17: 500 [IU]
  Filled 2022-05-17: qty 5

## 2022-05-17 NOTE — Progress Notes (Signed)
Physical Therapy Treatment Patient Details Name: Brandy Wheeler MRN: [id number] DOB: Mar 08, 1932 Today's Date: 05/17/2022   History of Present Illness Pt s/p lararoscopic complee cystect ileal conduit , bil pevic lymphadenectomy and ileal conduit urinary diversion 2* bladder CA.  Pt with L ankle fx 04/28/22 and NWB on same    PT Comments    Patient reports feeling well and following cues well today. Min assist only to roll and press up to sit EOB. Total assist provided for dressing lower body with brief and pants prior to sit<>stand transfer. Pt required cues to maintain NWB on Lt LE and therapist placed foot under Lt LE to ensure pt maintained NWB. Pt completed 2x sit<>stand and took small hopping steps to Rt to move up to [facility]. Pt returned to supine and completed LE strengthening in bed. Pt will continue to benefit from skilled PT interventions. Will continue to progress as able. Recommend SNF rehab.     Recommendations for follow up therapy are one component of a multi-disciplinary discharge planning process, led by the attending physician.  Recommendations may be updated based on patient status, additional functional criteria and insurance authorization.  Follow Up Recommendations  Skilled nursing-short term rehab (<3 hours/day) Can patient physically be transported by private vehicle: No   Assistance Recommended at Discharge Frequent or constant Supervision/Assistance  Patient can return home with the following Two people to help with walking and/or transfers;A lot of help with bathing/dressing/bathroom;Assistance with cooking/housework;Assist for transportation;Help with stairs or ramp for entrance   Equipment Recommendations  None recommended by PT    Recommendations for Other Services       Precautions / Restrictions Precautions Precautions: Fall Precaution Comments: NWB L LE and R LE buckling with attempts to stand Restrictions Weight Bearing Restrictions: Yes LLE Weight Bearing:  Non weight bearing     Mobility  Bed Mobility Overal bed mobility: Needs Assistance Bed Mobility: Rolling, Sidelying to Sit, Sit to Sidelying Rolling: Min assist Sidelying to sit: Min assist, HOB elevated     Sit to sidelying: Mod assist General bed mobility comments: pt able to initiate rolling in bed, min assist to complete. Patient required min assist to to fully press up from sidelying. Mod assist to control lowering back to bed and raise LE's up.    Transfers Overall transfer level: Needs assistance Equipment used: Rolling walker (2 wheels) Transfers: Sit to/from Stand Sit to Stand: Mod assist, +2 physical assistance, From elevated surface, Min assist           General transfer comment: Mod Assist +2 to rise from EOB    Ambulation/Gait                   Stairs             Wheelchair Mobility    Modified Rankin (Stroke Patients Only)       Balance Overall balance assessment: Needs assistance Sitting-balance support: Feet supported, No upper extremity supported Sitting balance-Leahy Scale: Good     Standing balance support: Bilateral upper extremity supported, Reliant on assistive device for balance, During functional activity Standing balance-Leahy Scale: Poor                              Cognition Arousal/Alertness: Awake/alert Behavior During Therapy: WFL for tasks assessed/performed Overall Cognitive Status: Within Functional Limits for tasks assessed  General Comments: pleasant, follows cues well with some extra time.        Exercises General Exercises - Lower Extremity Short Arc Quad: AROM, Both, 10 reps Hip ABduction/ADduction: AAROM, Both, 10 reps Straight Leg Raises: AROM, Both, 5 reps    General Comments        Pertinent Vitals/Pain Pain Assessment Pain Assessment: Faces Faces Pain Scale: No hurt Pain Intervention(s): Monitored during session, Repositioned     Home Living Family/patient expects to be discharged to:: Skilled nursing facility Living Arrangements: Children                      Prior Function            PT Goals (current goals can now be found in the care plan section) Acute Rehab PT Goals Patient Stated Goal: REgain IND PT Goal Formulation: With patient Time For Goal Achievement: 05/24/22 Potential to Achieve Goals: Fair Progress towards PT goals: Progressing toward goals    Frequency    Min 3X/week      PT Plan Current plan remains appropriate    Co-evaluation              AM-PAC PT "6 Clicks" Mobility   Outcome Measure  Help needed turning from your back to your side while in a flat bed without using bedrails?: A Little Help needed moving from lying on your back to sitting on the side of a flat bed without using bedrails?: A Little Help needed moving to and from a bed to a chair (including a wheelchair)?: Total Help needed standing up from a chair using your arms (e.g., wheelchair or bedside chair)?: A Lot Help needed to walk in hospital room?: Total Help needed climbing 3-5 steps with a railing? : Total 6 Click Score: 11    End of Session Equipment Utilized During Treatment: Gait belt Activity Tolerance: Patient tolerated treatment well Patient left: in bed;with call bell/phone within reach;with bed alarm set;with nursing/sitter in room Nurse Communication: Mobility status PT Visit Diagnosis: Difficulty in walking, not elsewhere classified (R26.2);Pain Pain - Right/Left: Left Pain - part of body: Ankle and joints of foot     Time: 7078-6754 PT Time Calculation (min) (ACUTE ONLY): 24 min  Charges:  $Therapeutic Activity: 23-37 mins                     Verner Mould, DPT Acute Rehabilitation Services Office [phone]  05/17/22 2:08 PM

## 2022-05-17 NOTE — Progress Notes (Signed)
Report called [facility] , spoke with Nursing supervisor Burton RN.  Jerene Pitch

## 2022-05-17 NOTE — Discharge Summary (Signed)
Physician Discharge Summary  Patient ID: Brandy Wheeler MRN: [id number] DOB/AGE: 1931-07-20 89 y.o.  Admit date: 05/08/2022 Discharge date: 05/17/2022  Admission Diagnoses: Bladder Cancer, Recent Leg Fracture  Discharge Diagnoses:  Principal Problem:   Surgery, elective Active Problems:   Bladder cancer ([place])   Malnutrition of moderate degree   Discharged Condition: fair  Hospital Course:   1 - Oligometastatic Bladder Cancer - s/p robotic cystectomy / node dissection / conduit diversion 1/17/2 for pT4N1Mx high grade urothelial cancer. Margins negative. Admitted 1/16 for bowel prep. Proph Lovenox (since non-ambulatory) started POD 1. JP Cr same as serum 1/23 and removed 1/25.   2 - Ileus - bowel anastamosis as part of planned urinary diversion above. NPO initiall post-op. Received entereg. Reglan added POD 1. Clears started POD 2. Resumed bowel function POD 3. Advanced to fulls POD 4, then regular POD 5. Tolerating regular diet at discharge.   3 - Acute on Chronic Anemia - hgb 9's on admit likely from some chronic hematuria and anemia of chronic disease. Hgb 7's post-op but with some tachycardia. Given 1upRBC and Hgb stable mid 8s with improved tachycardia. Hgb 8.5 at discharge.   4 - Disposition / Rehab - independatn at baseline, but had recent left leg fracture and non-weight bearing for next month. Also new ostomy.   PT eval recs SNF. Ostomy RN team working with in house. SNF placement authorized 05/17/22.    Consults:  case management, wound osotomy  Significant Diagnostic Studies: labs: as per above  Treatments: surgery: as per above  Discharge Exam: Blood pressure (!) 126/51, pulse (!) 112, temperature 98.3 F (36.8 C), resp. rate 16, height '5\' 3"'$  (1.6 m), weight 62.6 kg, SpO2 95 %.  NAD, AOx3, very pleasant. Sleepy but arousable.  Non-labored breathing on RA Improved regular tachycardia  SNTND, recent port and extraction sties c/d/I.  RLQ Uroatomy pink/patent with Rt (red)  and Lt (blue) bander stents and non-foul urine JP with scant non-foul serous output, removed and dry dressing placed.  RLE SCD in place. LLE in cast with stable mild foot edema  Disposition: [facility]   Allergies as of 05/17/2022   No Known Allergies      Medication List     STOP taking these medications    ciprofloxacin 500 MG tablet Commonly known as: CIPRO   Myrbetriq 50 MG Tb24 tablet Generic drug: mirabegron ER       TAKE these medications    BONE DENSITY BUILDER PO Take 1 tablet by mouth in the morning. [employer] Bone Builder Forte   omeprazole 20 MG capsule Commonly known as: PRILOSEC Take 20 mg by mouth in the morning.   oxyCODONE-acetaminophen 5-325 MG tablet Commonly known as: Percocet Take 1 tablet by mouth every 6 (six) hours as needed for severe pain or moderate pain (post-operatively). What changed:  when to take this reasons to take this   potassium chloride 10 MEQ tablet Commonly known as: KLOR-CON M Take 2 tablets (20 mEq total) by mouth 2 (two) times daily for 7 days.   VITAMIN B-12 PO Take 1 tablet by mouth in the morning.   VITAMIN D3 PO Take 1 tablet by mouth in the morning.        Contact information for follow-up providers     [name], MD Follow up on 06/04/2022.   Specialty: Urology Why: at 10:30 for MD visit. Contact information: [place] [address] [phone]  Contact information for after-discharge care     Destination     [facility] .   Service: Skilled Nursing Contact information: [address] Test Update Address [place] [phone]                     Signed: [name] 05/17/2022, 1:19 PM

## 2022-05-17 NOTE — Plan of Care (Signed)
  Problem: Education: Goal: Knowledge of General Education information will improve Description: Including pain rating scale, medication(s)/side effects and non-pharmacologic comfort measures 05/17/2022 1645 by Jerene Pitch, RN Outcome: Adequate for Discharge 05/17/2022 1541 by Jerene Pitch, RN Outcome: Progressing   Problem: Health Behavior/Discharge Planning: Goal: Ability to manage health-related needs will improve 05/17/2022 1645 by Jerene Pitch, RN Outcome: Adequate for Discharge 05/17/2022 1541 by Jerene Pitch, RN Outcome: Progressing   Problem: Clinical Measurements: Goal: Ability to maintain clinical measurements within normal limits will improve Outcome: Adequate for Discharge Goal: Will remain free from infection Outcome: Adequate for Discharge Goal: Diagnostic test results will improve Outcome: Adequate for Discharge Goal: Respiratory complications will improve Outcome: Adequate for Discharge Goal: Cardiovascular complication will be avoided Outcome: Adequate for Discharge   Problem: Activity: Goal: Risk for activity intolerance will decrease Outcome: Adequate for Discharge   Problem: Nutrition: Goal: Adequate nutrition will be maintained Outcome: Adequate for Discharge   Problem: Coping: Goal: Level of anxiety will decrease Outcome: Adequate for Discharge   Problem: Elimination: Goal: Will not experience complications related to bowel motility Outcome: Adequate for Discharge Goal: Will not experience complications related to urinary retention Outcome: Adequate for Discharge   Problem: Pain Managment: Goal: General experience of comfort will improve Outcome: Adequate for Discharge   Problem: Safety: Goal: Ability to remain free from injury will improve Outcome: Adequate for Discharge   Problem: Skin Integrity: Goal: Risk for impaired skin integrity will decrease Outcome: Adequate for Discharge   Problem: Malnutrition  (NI-5.2) Goal:  Food and/or nutrient delivery Description: Individualized approach for food/nutrient provision. Outcome: Adequate for Discharge   Problem: Acute Rehab PT Goals(only PT should resolve) Goal: Pt Will Go Supine/Side To Sit Outcome: Adequate for Discharge Goal: Pt Will Go Sit To Supine/Side Outcome: Adequate for Discharge Goal: Patient Will Transfer Sit To/From Stand Outcome: Adequate for Discharge Goal: Pt Will Transfer Bed To Chair/Chair To Bed Outcome: Adequate for Discharge

## 2022-05-17 NOTE — Plan of Care (Signed)
  Problem: Education: Goal: Knowledge of General Education information will improve Description: Including pain rating scale, medication(s)/side effects and non-pharmacologic comfort measures Outcome: Progressing   Problem: Health Behavior/Discharge Planning: Goal: Ability to manage health-related needs will improve Outcome: Progressing

## 2022-05-17 NOTE — TOC Transition Note (Signed)
Transition of Care [facility]) - CM/SW Discharge Note   Patient Details  Name: Brandy Wheeler MRN: [id number] Date of Birth: [birth year]  [facility] CM/SW Contact:  Illene Regulus, LCSW Phone Number: 05/17/2022, 12:51 PM   Clinical Narrative:    Pt's insurance Josem Kaufmann was approved. Pt to d/c to [facility]. CSW spoke with pt's daughter who is requesting EMS transport.  Facility requesting Pic line to be removed if not IV abx at d/c. Room assignment 035D call report [phone]. No additional needs TOC sign off.    Final next level of care: [place] Barriers to Discharge: No Barriers Identified   Patient Goals and CMS Choice      Discharge Placement                  Patient to be transferred to facility by: EMS Name of family member notified: Gae Dry (Daughter) [phone] (Mobile) Patient and family notified of of transfer: 05/17/22  Discharge Plan and Services Additional resources added to the After Visit Summary for   In-house Referral: NA Discharge Planning Services: CM Consult            DME Arranged: N/A DME Agency: NA       HH Arranged: NA HH Agency: NA        Social Determinants of Health (SDOH) Interventions SDOH Screenings   Food Insecurity: No Food Insecurity (05/08/2022)  Housing: Low Risk  (05/08/2022)  Transportation Needs: No Transportation Needs (05/08/2022)  Utilities: Not At Risk (05/08/2022)  Tobacco Use: Low Risk  (05/10/2022)     Readmission Risk Interventions     No data to display

## 2022-05-17 NOTE — Care Management Important Message (Signed)
Important Message  Patient Details IM Letter given. Name: Brandy Wheeler MRN: [id number] Date of Birth: 13-Apr-1932   Medicare Important Message Given:  Yes     Kerin Salen 05/17/2022, 1:07 PM

## 2022-05-18 ENCOUNTER — Telehealth: Payer: Self-pay | Admitting: Hematology

## 2022-05-18 NOTE — Telephone Encounter (Signed)
Called patient per 1/25 in basket to r/s to later appointment time in new patient slot. Left voicemail with new appointment time.

## 2022-05-22 DIAGNOSIS — E440 Moderate protein-calorie malnutrition: Secondary | ICD-10-CM | POA: Diagnosis not present

## 2022-05-22 DIAGNOSIS — R41841 Cognitive communication deficit: Secondary | ICD-10-CM | POA: Diagnosis not present

## 2022-05-22 DIAGNOSIS — R2689 Other abnormalities of gait and mobility: Secondary | ICD-10-CM | POA: Diagnosis not present

## 2022-05-22 DIAGNOSIS — C679 Malignant neoplasm of bladder, unspecified: Secondary | ICD-10-CM | POA: Diagnosis not present

## 2022-05-23 ENCOUNTER — Emergency Department (HOSPITAL_COMMUNITY): Payer: Medicare HMO

## 2022-05-23 ENCOUNTER — Inpatient Hospital Stay (HOSPITAL_COMMUNITY): Payer: Medicare HMO

## 2022-05-23 ENCOUNTER — Inpatient Hospital Stay (HOSPITAL_COMMUNITY)
Admission: EM | Admit: 2022-05-23 | Discharge: 2022-06-06 | DRG: 853 | Disposition: A | Discharge status: Hospice | Payer: Medicare HMO | Source: Skilled Nursing Facility | Attending: Internal Medicine | Admitting: Internal Medicine

## 2022-05-23 ENCOUNTER — Telehealth: Payer: Self-pay | Admitting: Hematology

## 2022-05-23 ENCOUNTER — Other Ambulatory Visit: Payer: Self-pay

## 2022-05-23 DIAGNOSIS — J90 Pleural effusion, not elsewhere classified: Secondary | ICD-10-CM | POA: Diagnosis not present

## 2022-05-23 DIAGNOSIS — M869 Osteomyelitis, unspecified: Secondary | ICD-10-CM | POA: Diagnosis not present

## 2022-05-23 DIAGNOSIS — M898X6 Other specified disorders of bone, lower leg: Secondary | ICD-10-CM | POA: Diagnosis not present

## 2022-05-23 DIAGNOSIS — K5730 Diverticulosis of large intestine without perforation or abscess without bleeding: Secondary | ICD-10-CM | POA: Diagnosis not present

## 2022-05-23 DIAGNOSIS — N3001 Acute cystitis with hematuria: Principal | ICD-10-CM

## 2022-05-23 DIAGNOSIS — Z6824 Body mass index (BMI) 24.0-24.9, adult: Secondary | ICD-10-CM

## 2022-05-23 DIAGNOSIS — Z1152 Encounter for screening for COVID-19: Secondary | ICD-10-CM

## 2022-05-23 DIAGNOSIS — E43 Unspecified severe protein-calorie malnutrition: Secondary | ICD-10-CM | POA: Diagnosis not present

## 2022-05-23 DIAGNOSIS — Z8673 Personal history of transient ischemic attack (TIA), and cerebral infarction without residual deficits: Secondary | ICD-10-CM

## 2022-05-23 DIAGNOSIS — X58XXXD Exposure to other specified factors, subsequent encounter: Secondary | ICD-10-CM | POA: Diagnosis present

## 2022-05-23 DIAGNOSIS — R739 Hyperglycemia, unspecified: Secondary | ICD-10-CM | POA: Diagnosis present

## 2022-05-23 DIAGNOSIS — S82202D Unspecified fracture of shaft of left tibia, subsequent encounter for closed fracture with routine healing: Secondary | ICD-10-CM

## 2022-05-23 DIAGNOSIS — R0682 Tachypnea, not elsewhere classified: Secondary | ICD-10-CM | POA: Diagnosis not present

## 2022-05-23 DIAGNOSIS — N136 Pyonephrosis: Secondary | ICD-10-CM | POA: Diagnosis present

## 2022-05-23 DIAGNOSIS — Z515 Encounter for palliative care: Secondary | ICD-10-CM | POA: Diagnosis not present

## 2022-05-23 DIAGNOSIS — R4182 Altered mental status, unspecified: Secondary | ICD-10-CM | POA: Diagnosis not present

## 2022-05-23 DIAGNOSIS — Z79899 Other long term (current) drug therapy: Secondary | ICD-10-CM

## 2022-05-23 DIAGNOSIS — T797XXA Traumatic subcutaneous emphysema, initial encounter: Secondary | ICD-10-CM | POA: Diagnosis not present

## 2022-05-23 DIAGNOSIS — R10819 Abdominal tenderness, unspecified site: Secondary | ICD-10-CM | POA: Diagnosis not present

## 2022-05-23 DIAGNOSIS — N390 Urinary tract infection, site not specified: Secondary | ICD-10-CM

## 2022-05-23 DIAGNOSIS — M79604 Pain in right leg: Secondary | ICD-10-CM | POA: Diagnosis not present

## 2022-05-23 DIAGNOSIS — Z66 Do not resuscitate: Secondary | ICD-10-CM | POA: Diagnosis present

## 2022-05-23 DIAGNOSIS — Z936 Other artificial openings of urinary tract status: Secondary | ICD-10-CM | POA: Diagnosis not present

## 2022-05-23 DIAGNOSIS — E860 Dehydration: Secondary | ICD-10-CM | POA: Diagnosis not present

## 2022-05-23 DIAGNOSIS — N179 Acute kidney failure, unspecified: Secondary | ICD-10-CM

## 2022-05-23 DIAGNOSIS — E870 Hyperosmolality and hypernatremia: Secondary | ICD-10-CM | POA: Diagnosis not present

## 2022-05-23 DIAGNOSIS — N1330 Unspecified hydronephrosis: Secondary | ICD-10-CM | POA: Diagnosis not present

## 2022-05-23 DIAGNOSIS — Z466 Encounter for fitting and adjustment of urinary device: Secondary | ICD-10-CM | POA: Diagnosis not present

## 2022-05-23 DIAGNOSIS — I10 Essential (primary) hypertension: Secondary | ICD-10-CM | POA: Diagnosis not present

## 2022-05-23 DIAGNOSIS — D649 Anemia, unspecified: Secondary | ICD-10-CM

## 2022-05-23 DIAGNOSIS — E876 Hypokalemia: Secondary | ICD-10-CM | POA: Diagnosis not present

## 2022-05-23 DIAGNOSIS — B9562 Methicillin resistant Staphylococcus aureus infection as the cause of diseases classified elsewhere: Secondary | ICD-10-CM | POA: Diagnosis present

## 2022-05-23 DIAGNOSIS — R6520 Severe sepsis without septic shock: Secondary | ICD-10-CM | POA: Diagnosis present

## 2022-05-23 DIAGNOSIS — R7881 Bacteremia: Secondary | ICD-10-CM | POA: Diagnosis not present

## 2022-05-23 DIAGNOSIS — R627 Adult failure to thrive: Secondary | ICD-10-CM | POA: Diagnosis not present

## 2022-05-23 DIAGNOSIS — R600 Localized edema: Secondary | ICD-10-CM | POA: Diagnosis not present

## 2022-05-23 DIAGNOSIS — R531 Weakness: Secondary | ICD-10-CM | POA: Diagnosis not present

## 2022-05-23 DIAGNOSIS — E875 Hyperkalemia: Secondary | ICD-10-CM | POA: Diagnosis not present

## 2022-05-23 DIAGNOSIS — K219 Gastro-esophageal reflux disease without esophagitis: Secondary | ICD-10-CM | POA: Diagnosis not present

## 2022-05-23 DIAGNOSIS — S82402D Unspecified fracture of shaft of left fibula, subsequent encounter for closed fracture with routine healing: Secondary | ICD-10-CM

## 2022-05-23 DIAGNOSIS — J982 Interstitial emphysema: Secondary | ICD-10-CM | POA: Diagnosis not present

## 2022-05-23 DIAGNOSIS — R471 Dysarthria and anarthria: Secondary | ICD-10-CM | POA: Diagnosis present

## 2022-05-23 DIAGNOSIS — M86161 Other acute osteomyelitis, right tibia and fibula: Secondary | ICD-10-CM | POA: Diagnosis not present

## 2022-05-23 DIAGNOSIS — J439 Emphysema, unspecified: Secondary | ICD-10-CM | POA: Diagnosis not present

## 2022-05-23 DIAGNOSIS — M898X8 Other specified disorders of bone, other site: Secondary | ICD-10-CM | POA: Diagnosis not present

## 2022-05-23 DIAGNOSIS — D630 Anemia in neoplastic disease: Secondary | ICD-10-CM | POA: Diagnosis present

## 2022-05-23 DIAGNOSIS — R748 Abnormal levels of other serum enzymes: Secondary | ICD-10-CM | POA: Diagnosis present

## 2022-05-23 DIAGNOSIS — M25571 Pain in right ankle and joints of right foot: Secondary | ICD-10-CM | POA: Diagnosis not present

## 2022-05-23 DIAGNOSIS — C7951 Secondary malignant neoplasm of bone: Secondary | ICD-10-CM | POA: Diagnosis present

## 2022-05-23 DIAGNOSIS — C679 Malignant neoplasm of bladder, unspecified: Secondary | ICD-10-CM | POA: Diagnosis not present

## 2022-05-23 DIAGNOSIS — G9341 Metabolic encephalopathy: Secondary | ICD-10-CM | POA: Diagnosis not present

## 2022-05-23 DIAGNOSIS — Z8551 Personal history of malignant neoplasm of bladder: Secondary | ICD-10-CM | POA: Diagnosis not present

## 2022-05-23 DIAGNOSIS — Z90710 Acquired absence of both cervix and uterus: Secondary | ICD-10-CM

## 2022-05-23 DIAGNOSIS — R208 Other disturbances of skin sensation: Secondary | ICD-10-CM | POA: Diagnosis not present

## 2022-05-23 DIAGNOSIS — A419 Sepsis, unspecified organism: Secondary | ICD-10-CM | POA: Diagnosis not present

## 2022-05-23 DIAGNOSIS — Z9221 Personal history of antineoplastic chemotherapy: Secondary | ICD-10-CM

## 2022-05-23 DIAGNOSIS — Z743 Need for continuous supervision: Secondary | ICD-10-CM | POA: Diagnosis not present

## 2022-05-23 DIAGNOSIS — B954 Other streptococcus as the cause of diseases classified elsewhere: Secondary | ICD-10-CM | POA: Diagnosis present

## 2022-05-23 LAB — RESP PANEL BY RT-PCR (RSV, FLU A&B, COVID)  RVPGX2
Influenza A by PCR: NEGATIVE
Influenza B by PCR: NEGATIVE
Resp Syncytial Virus by PCR: NEGATIVE
SARS Coronavirus 2 by RT PCR: NEGATIVE

## 2022-05-23 LAB — COMPREHENSIVE METABOLIC PANEL
ALT: 13 U/L (ref 0–44)
AST: 24 U/L (ref 15–41)
Albumin: 2.4 g/dL — ABNORMAL LOW (ref 3.5–5.0)
Alkaline Phosphatase: 187 U/L — ABNORMAL HIGH (ref 38–126)
Anion gap: 8 (ref 5–15)
BUN: 49 mg/dL — ABNORMAL HIGH (ref 8–23)
CO2: 26 mmol/L (ref 22–32)
Calcium: 12.3 mg/dL — ABNORMAL HIGH (ref 8.9–10.3)
Chloride: 106 mmol/L (ref 98–111)
Creatinine, Ser: 0.97 mg/dL (ref 0.44–1.00)
GFR, Estimated: 56 mL/min — ABNORMAL LOW (ref >60)
Glucose, Bld: 122 mg/dL — ABNORMAL HIGH (ref 70–99)
Potassium: 5.2 mmol/L — ABNORMAL HIGH (ref 3.5–5.1)
Sodium: 140 mmol/L (ref 135–145)
Total Bilirubin: 0.7 mg/dL (ref 0.3–1.2)
Total Protein: 5.9 g/dL — ABNORMAL LOW (ref 6.5–8.1)

## 2022-05-23 LAB — URINALYSIS, ROUTINE W REFLEX MICROSCOPIC
Bilirubin Urine: NEGATIVE
Glucose, UA: NEGATIVE mg/dL
Ketones, ur: NEGATIVE mg/dL
Nitrite: POSITIVE — AB
Protein, ur: 100 mg/dL — AB
RBC / HPF: >50 RBC/hpf (ref 0–5)
Specific Gravity, Urine: 1.013 (ref 1.005–1.030)
WBC, UA: >50 WBC/hpf (ref 0–5)
pH: 7 (ref 5.0–8.0)

## 2022-05-23 LAB — CBC WITH DIFFERENTIAL/PLATELET
Abs Immature Granulocytes: 0.23 10*3/uL — ABNORMAL HIGH (ref 0.00–0.07)
Basophils Absolute: 0.1 10*3/uL (ref 0.0–0.1)
Basophils Relative: 0 %
Eosinophils Absolute: 0.3 10*3/uL (ref 0.0–0.5)
Eosinophils Relative: 2 %
HCT: 31.6 % — ABNORMAL LOW (ref 36.0–46.0)
Hemoglobin: 9.7 g/dL — ABNORMAL LOW (ref 12.0–15.0)
Immature Granulocytes: 2 %
Lymphocytes Relative: 16 %
Lymphs Abs: 2.6 10*3/uL (ref 0.7–4.0)
MCH: 27.5 pg (ref 26.0–34.0)
MCHC: 30.7 g/dL (ref 30.0–36.0)
MCV: 89.5 fL (ref 80.0–100.0)
Monocytes Absolute: 1.1 10*3/uL — ABNORMAL HIGH (ref 0.1–1.0)
Monocytes Relative: 7 %
Neutro Abs: 11.5 10*3/uL — ABNORMAL HIGH (ref 1.7–7.7)
Neutrophils Relative %: 73 %
Platelets: 455 10*3/uL — ABNORMAL HIGH (ref 150–400)
RBC: 3.53 MIL/uL — ABNORMAL LOW (ref 3.87–5.11)
RDW: 14.6 % (ref 11.5–15.5)
WBC: 15.8 10*3/uL — ABNORMAL HIGH (ref 4.0–10.5)
nRBC: 0 % (ref 0.0–0.2)

## 2022-05-23 LAB — PROTIME-INR
INR: 1.3 — ABNORMAL HIGH (ref 0.8–1.2)
Prothrombin Time: 15.8 seconds — ABNORMAL HIGH (ref 11.4–15.2)

## 2022-05-23 LAB — LACTIC ACID, PLASMA: Lactic Acid, Venous: 1.2 mmol/L (ref 0.5–1.9)

## 2022-05-23 MED ORDER — SENNOSIDES-DOCUSATE SODIUM 8.6-50 MG PO TABS: 1.0000 | ORAL_TABLET | Freq: Every evening | ORAL | Status: DC | PRN

## 2022-05-23 MED ORDER — SODIUM CHLORIDE 0.9 % IV SOLN: 2.0000 g | Freq: Once | INTRAVENOUS | Status: DC

## 2022-05-23 MED ORDER — ACETAMINOPHEN 325 MG PO TABS
650.0000 mg | ORAL_TABLET | Freq: Four times a day (QID) | ORAL | Status: DC | PRN
  Administered 2022-06-05: 650 mg via ORAL
  Filled 2022-05-23: qty 2

## 2022-05-23 MED ORDER — ONDANSETRON HCL 4 MG/2ML IJ SOLN: 4.0000 mg | Freq: Four times a day (QID) | INTRAMUSCULAR | Status: DC | PRN

## 2022-05-23 MED ORDER — SODIUM CHLORIDE (PF) 0.9 % IJ SOLN
INTRAMUSCULAR | Status: AC
  Filled 2022-05-23: qty 50

## 2022-05-23 MED ORDER — SODIUM CHLORIDE 0.9% FLUSH
3.0000 mL | Freq: Two times a day (BID) | INTRAVENOUS | Status: DC
  Administered 2022-05-24 – 2022-06-06 (×21): 3 mL via INTRAVENOUS

## 2022-05-23 MED ORDER — IOHEXOL 9 MG/ML PO SOLN
ORAL | Status: AC
  Filled 2022-05-23: qty 1000

## 2022-05-23 MED ORDER — SODIUM CHLORIDE 0.9 % IV SOLN: INTRAVENOUS | Status: AC

## 2022-05-23 MED ORDER — ACETAMINOPHEN 650 MG RE SUPP
650.0000 mg | Freq: Four times a day (QID) | RECTAL | Status: DC | PRN
  Administered 2022-05-24 – 2022-05-28 (×3): 650 mg via RECTAL
  Filled 2022-05-23 (×3): qty 1

## 2022-05-23 MED ORDER — SODIUM CHLORIDE 0.9 % IV SOLN
2.0000 g | Freq: Two times a day (BID) | INTRAVENOUS | Status: DC
  Administered 2022-05-23 – 2022-05-26 (×6): 2 g via INTRAVENOUS
  Filled 2022-05-23 (×6): qty 12.5

## 2022-05-23 MED ORDER — IOHEXOL 300 MG/ML  SOLN
75.0000 mL | Freq: Once | INTRAMUSCULAR | Status: AC | PRN
  Administered 2022-05-23: 75 mL via INTRAVENOUS

## 2022-05-23 MED ORDER — SODIUM CHLORIDE 0.9 % IV BOLUS
1000.0000 mL | Freq: Once | INTRAVENOUS | Status: AC
  Administered 2022-05-23: 1000 mL via INTRAVENOUS

## 2022-05-23 MED ORDER — ONDANSETRON HCL 4 MG PO TABS: 4.0000 mg | ORAL_TABLET | Freq: Four times a day (QID) | ORAL | Status: DC | PRN

## 2022-05-23 MED ORDER — SODIUM CHLORIDE 0.9 % IV SOLN
2.0000 g | Freq: Once | INTRAVENOUS | Status: AC
  Administered 2022-05-23: 2 g via INTRAVENOUS
  Filled 2022-05-23: qty 20

## 2022-05-23 MED ORDER — HEPARIN SODIUM (PORCINE) 5000 UNIT/ML IJ SOLN
5000.0000 [IU] | Freq: Three times a day (TID) | INTRAMUSCULAR | Status: DC
  Administered 2022-05-23 – 2022-05-28 (×15): 5000 [IU] via SUBCUTANEOUS
  Filled 2022-05-23 (×14): qty 1

## 2022-05-23 NOTE — ED Notes (Signed)
Pt daughter updated on pt being admitted.

## 2022-05-23 NOTE — H&P (Signed)
History and Physical    Brandy Wheeler YQM:[id number] DOB: 1931/12/24 DOA: 05/23/2022  PCP: Pcp, No  Patient coming from: [facility] SNF  I have personally briefly reviewed patient's old medical records in [place]  Chief Complaint: Altered mental status  HPI: Brandy Wheeler is a 89 y.o. female with medical history significant for bladder cancer s/p robotic cystectomy and ileal conduit diversion 05/09/2022, anemia who presented to the ED from SNF for evaluation of poor oral intake and weakness.  Patient is not able to provide history due to encephalopathy which is otherwise supplemented by EDP, chart review, and daughter by phone.  Patient recently admitted 05/09/2022-05/17/2022 to undergo radical cystectomy and ileal conduit urinary diversion performed on 05/09/2022 by Dr. 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  During admission she was noted to have an ileus.  At time of discharge she was having regular bowel function and tolerating regular diet.  She was discharged to SNF.  Family states that over the last few days she has been more lethargic and not eating or drinking much.  She has not been given any narcotic pain medications as she was not asking for them.  Due to change in her clinical status she was sent to the ED for further evaluation.  Unclear when her last bowel movement was.  No reported nausea or vomiting.  ED Course  Labs/Imaging on admission: I have personally reviewed following labs and imaging studies.  Initial vitals showed BP 136/96, pulse 119, RR 16, temp 97.8 F, SpO2 100% on room air.  While in the ED patient tachypneic with RR up to 39.  Labs showed WBC 15.8, hemoglobin 9.7, platelets 455,000, sodium 140, potassium 5.2, bicarb 26, BUN 49, creatinine 0.97, serum glucose 122, calcium 12.3, albumin 2.4, lactic acid 1.2.  Urinalysis showed positive nitrites, large leukocytes, >50 RBCs and WBCs, rare bacteria.  COVID, influenza, RSV negative.  2  view chest x-ray negative for acute cardiopulmonary abnormality.  Subcutaneous emphysema in the upper abdomen likely related to recent laparoscopic procedure noted.  She was given 1 L normal saline and 2 g IV ceftriaxone.  Urology consulted and will see the patient.  The hospitalist service was consulted to admit for further evaluation and management.  Review of Systems: All systems reviewed and are negative except as documented in history of present illness above.   Past Medical History:  Diagnosis Date   Bladder cancer ([place])    Diverticulitis    Fracture of right fibula 10/10/2013   closed   GERD (gastroesophageal reflux disease)    History of hiatal hernia    Hypertension     Past Surgical History:  Procedure Laterality Date   ABDOMINAL HYSTERECTOMY  1978   APPENDECTOMY     AGE 78   CYSTOSCOPY W/ URETERAL STENT PLACEMENT Right 10/09/2021   Procedure: POSSIBLE BILATERAL  RETROGRADE POSSIBLE RIGHT Melene Muller STENT;  Surgeon: Lucas Mallow, MD;  Location: [facility];  Service: Urology;  Laterality: Right;   CYSTOSCOPY WITH INJECTION N/A 05/09/2022   Procedure: CYSTOSCOPY WITH INJECTION OF INDOCYANINE GREEN DYE;  Surgeon: [name], MD;  Location: [facility];  Service: Urology;  Laterality: N/A;   DIAGNOSTIC LAPAROSCOPY  1974   IR IMAGING GUIDED PORT INSERTION  11/15/2021   LYMPH NODE DISSECTION Bilateral 05/09/2022   Procedure: PELVIC LYMPH NODE DISSECTION;  Surgeon: [name], MD;  Location: [facility];  Service: Urology;  Laterality: Bilateral;   ROBOT ASSISTED LAPAROSCOPIC COMPLETE CYSTECT ILEAL CONDUIT N/A 05/09/2022  Procedure: XI ROBOTIC ASSISTED LAPAROSCOPIC COMPLETE CYSTECT ILEAL CONDUIT;  Surgeon: [name], MD;  Location: [facility];  Service: Urology;  Laterality: N/A;  Seg[place]   to lift uterus prior to getting pregnant   TRANSURETHRAL RESECTION OF BLADDER TUMOR Bilateral 10/09/2021   Procedure: TRANSURETHRAL RESECTION OF BLADDER TUMOR (TURBT)  POSSIBLE BILATERAL RETROGRADE POSSIBLE RIGHT URETERAL STENT;  Surgeon: Lucas Mallow, MD;  Location: [facility];  Service: Urology;  Laterality: Bilateral;    Social History:  reports that she has never smoked. She has never used smokeless tobacco. She reports current alcohol use. She reports that she does not use drugs.  No Known Allergies  No family history on file.   Prior to Admission medications   Medication Sig Start Date End Date Taking? Authorizing Provider  Cholecalciferol (VITAMIN D3 PO) Take 1 tablet by mouth in the morning.    [provider]  Cyanocobalamin (VITAMIN B-12 PO) Take 1 tablet by mouth in the morning.    [provider]  Multiple Minerals-Vitamins (BONE DENSITY BUILDER PO) Take 1 tablet by mouth in the morning. [employer] Bone Social worker, Historical, MD  omeprazole (PRILOSEC) 20 MG capsule Take 20 mg by mouth in the morning.    [provider]  oxyCODONE-acetaminophen (PERCOCET) 5-325 MG tablet Take 1 tablet by mouth every 6 (six) hours as needed for severe pain or moderate pain (post-operatively). 05/17/22   [name], MD  potassium chloride (KLOR-CON M) 10 MEQ tablet Take 2 tablets (20 mEq total) by mouth 2 (two) times daily for 7 days. 04/29/22 05/06/22  Molpus, Jenny Reichmann, MD    Physical Exam: Vitals:   05/23/22 1710 05/23/22 1730 05/23/22 1800 05/23/22 1830  BP: (!) 141/86 139/66 (!) 113/94 (!) 137/109  Pulse: (!) 112 (!) 106 (!) 111 (!) 112  Resp: (!) 39  (!) 32 (!) 31  Temp:      TempSrc:      SpO2: 100% 100% 100% 100%  Weight:      Height:       Constitutional: Resting in bed, very somnolent Eyes: lids and conjunctivae normal ENMT: Mucous membranes are dry. Posterior pharynx clear of any exudate or lesions.Normal dentition.  Neck: normal, supple, no masses. Respiratory: clear to auscultation anteriorly. Normal respiratory effort. No accessory muscle use.  Cardiovascular: Tachycardic, no murmurs / rubs /  gallops. No extremity edema. 2+ pedal pulses. Abdomen: Urostomy in place RLQ, yellow urine in collection bag.  Mild tenderness to palpation. Musculoskeletal: Cast in place left lower leg.  No clubbing / cyanosis. Good ROM, no contractures. Normal muscle tone.  Skin: no rashes, lesions, ulcers. No induration Neurologic: Sensation intact. Strength equal bilaterally. Psychiatric: Somnolent, not able to answer orientation questions.  EKG: Not performed.  Assessment/Plan Principal Problem:   Sepsis due to urinary tract infection (HCC) Active Problems:   Bladder cancer (HCC)   Hypercalcemia   Hyperkalemia   Normocytic anemia   Acute metabolic encephalopathy   Jaquita Bessire is a 89 y.o. female with medical history significant for bladder cancer s/p robotic cystectomy and ileal conduit diversion 05/09/2022, anemia who is admitted with sepsis due to UTI.  Assessment and Plan: Sepsis due to UTI: Presenting with leukocytosis, tachycardia, tachypnea.  UA concerning for UTI in setting of recent urological procedure. -Continue empiric IV cefepime -Obtain urine culture and blood cultures -Continue IV fluid hydration overnight  Bladder cancer s/p robotic cystectomy and ileal conduit 05/09/2022: Urology to follow.  Acute metabolic encephalopathy: In setting of sepsis and dehydration.  Continue management as above.  Hypercalcemia: In setting of dehydration.  Start IV NS'@125'$  mL/hour overnight and repeat labs in AM.  Hyperkalemia: Mild.  Continue IV fluids as above.  Repeat labs in AM.  Recent ileus: Postop ileus noted on prior admission.  CXR shows subcutaneous emphysema in the upper abdomen, likely related to recent procedure.  Appears to have some tenderness on exam.  Unknown when the last bowel movement was. -Keep n.p.o. -Obtain CT abdomen/pelvis  Anemia: Hemoglobin stable.   DVT prophylaxis: heparin injection 5,000 Units Start: 05/23/22 2200 Code Status: Full code Family Communication:  Daughter by phone Disposition Plan: From SNF, dispo pending clinical progress Consults called: Urology Severity of Illness: The appropriate patient status for this patient is INPATIENT. Inpatient status is judged to be reasonable and necessary in order to provide the required intensity of service to ensure the patient's safety. The patient's presenting symptoms, physical exam findings, and initial radiographic and laboratory data in the context of their chronic comorbidities is felt to place them at high risk for further clinical deterioration. Furthermore, it is not anticipated that the patient will be medically stable for discharge from the hospital within 2 midnights of admission.   * I certify that at the point of admission it is my clinical judgment that the patient will require inpatient hospital care spanning beyond 2 midnights from the point of admission due to high intensity of service, high risk for further deterioration and high frequency of surveillance required.Zada Finders MD [facility]  If 7PM-7AM, please contact night-coverage [email or web address]  05/23/2022, 7:39 PM

## 2022-05-23 NOTE — ED Triage Notes (Signed)
Patient BIB EMS from SNF [facility]) c/o weakness. Per report patient unable to eat or drink x 3 days. Pt a/ox1. Pt hx of Bladder Cancer.

## 2022-05-23 NOTE — Consult Note (Signed)
Urology Consult   Physician requesting consult: Dr. Posey Pronto  Reason for consult: UTI and dehydration   History of Present Illness: Brandy Wheeler is a 89 y.o. s/p radical cystectomy and ileal conduit urinary diversion 1/17 by Dr. 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  She was discharged to a SNF on 1/25 but presented back to the ED today with failure to thrive and poor oral intake.  No documented fevers.  She denies pain.  UA demonstrates bacteruria as expected.  WBC elevated > 15,000.   No nausea or vomiting.  Unclear when last bowel movement occurred.   Past Medical History:  Diagnosis Date   Bladder cancer ([place])    Diverticulitis    Fracture of right fibula 10/10/2013   closed   GERD (gastroesophageal reflux disease)    History of hiatal hernia    Hypertension     Past Surgical History:  Procedure Laterality Date   ABDOMINAL HYSTERECTOMY  1978   APPENDECTOMY     AGE 48   CYSTOSCOPY W/ URETERAL STENT PLACEMENT Right 10/09/2021   Procedure: POSSIBLE BILATERAL  RETROGRADE POSSIBLE RIGHT Melene Muller STENT;  Surgeon: Lucas Mallow, MD;  Location: [facility];  Service: Urology;  Laterality: Right;   CYSTOSCOPY WITH INJECTION N/A 05/09/2022   Procedure: CYSTOSCOPY WITH INJECTION OF INDOCYANINE GREEN DYE;  Surgeon: [name], MD;  Location: [facility];  Service: Urology;  Laterality: N/A;   DIAGNOSTIC LAPAROSCOPY  1974   IR IMAGING GUIDED PORT INSERTION  11/15/2021   LYMPH NODE DISSECTION Bilateral 05/09/2022   Procedure: PELVIC LYMPH NODE DISSECTION;  Surgeon: [name], MD;  Location: [facility];  Service: Urology;  Laterality: Bilateral;   ROBOT ASSISTED LAPAROSCOPIC COMPLETE CYSTECT ILEAL CONDUIT N/A 05/09/2022   Procedure: XI ROBOTIC ASSISTED LAPAROSCOPIC COMPLETE CYSTECT ILEAL CONDUIT;  Surgeon: [name], MD;  Location: [facility];  Service: Urology;  Laterality: N/A;  [place]   to lift uterus prior to getting pregnant   TRANSURETHRAL  RESECTION OF BLADDER TUMOR Bilateral 10/09/2021   Procedure: TRANSURETHRAL RESECTION OF BLADDER TUMOR (TURBT) POSSIBLE BILATERAL RETROGRADE POSSIBLE RIGHT URETERAL STENT;  Surgeon: Lucas Mallow, MD;  Location: [facility];  Service: Urology;  Laterality: Bilateral;     Current Hospital Medications:  Home meds:  No current facility-administered medications on file prior to encounter.   Current Outpatient Medications on File Prior to Encounter  Medication Sig Dispense Refill   Cholecalciferol (VITAMIN D3 PO) Take 1 tablet by mouth in the morning.     Cyanocobalamin (VITAMIN B-12 PO) Take 1 tablet by mouth in the morning.     Multiple Minerals-Vitamins (BONE DENSITY BUILDER PO) Take 1 tablet by mouth in the morning. [employer] Bone Builder Forte     omeprazole (PRILOSEC) 20 MG capsule Take 20 mg by mouth in the morning.     oxyCODONE-acetaminophen (PERCOCET) 5-325 MG tablet Take 1 tablet by mouth every 6 (six) hours as needed for severe pain or moderate pain (post-operatively). 15 tablet 0   potassium chloride (KLOR-CON M) 10 MEQ tablet Take 2 tablets (20 mEq total) by mouth 2 (two) times daily for 7 days. 28 tablet 0     Scheduled Meds: Continuous Infusions: PRN Meds:.  Allergies: No Known Allergies  No family history on file.  Social History:  reports that she has never smoked. She has never used smokeless tobacco. She reports current alcohol use. She reports that she does not use drugs.  ROS: A  complete review of systems was performed.  All systems are negative except for pertinent findings as noted.  Physical Exam:  Vital signs in last 24 hours: Temp:  [97.8 F (36.6 C)] 97.8 F (36.6 C) (01/31 1542) Pulse Rate:  [106-119] 112 (01/31 1830) Resp:  [16-39] 31 (01/31 1830) BP: (113-141)/(66-109) 137/109 (01/31 1830) SpO2:  [100 %] 100 % (01/31 1830) Weight:  [33 kg] 63 kg (01/31 1543) Constitutional:  No acute distress, dry lips/mouth Cardiovascular: Regular rate and rhythm,  No JVD Respiratory: Normal respiratory effort, Lungs clear bilaterally GI: Abdomen is soft, nontender, nondistended, no abdominal masses, urostomy pink, stents in place GU: No CVA tenderness Lymphatic: No lymphadenopathy Neurologic: Grossly intact, no focal deficits Psychiatric: Normal mood, not oriented to place   Laboratory Data:  Recent Labs    05/23/22 1600  WBC 15.8*  HGB 9.7*  HCT 31.6*  PLT 455*    Recent Labs    05/23/22 1600  NA 140  K 5.2*  CL 106  GLUCOSE 122*  BUN 49*  CALCIUM 12.3*  CREATININE 0.97     Results for orders placed or performed during the hospital encounter of 05/23/22 (from the past 24 hour(s))  Urinalysis, Routine w reflex microscopic -Urine, Clean Catch     Status: Abnormal   Collection Time: 05/23/22  3:50 PM  Result Value Ref Range   Color, Urine YELLOW YELLOW   APPearance CLOUDY (A) CLEAR   Specific Gravity, Urine 1.013 1.005 - 1.030   pH 7.0 5.0 - 8.0   Glucose, UA NEGATIVE NEGATIVE mg/dL   Hgb urine dipstick MODERATE (A) NEGATIVE   Bilirubin Urine NEGATIVE NEGATIVE   Ketones, ur NEGATIVE NEGATIVE mg/dL   Protein, ur 100 (A) NEGATIVE mg/dL   Nitrite POSITIVE (A) NEGATIVE   Leukocytes,Ua LARGE (A) NEGATIVE   RBC / HPF >50 0 - 5 RBC/hpf   WBC, UA >50 0 - 5 WBC/hpf   Bacteria, UA RARE (A) NONE SEEN   Squamous Epithelial / HPF 0-5 0 - 5 /HPF   WBC Clumps PRESENT    Mucus PRESENT   Comprehensive metabolic panel     Status: Abnormal   Collection Time: 05/23/22  4:00 PM  Result Value Ref Range   Sodium 140 135 - 145 mmol/L   Potassium 5.2 (H) 3.5 - 5.1 mmol/L   Chloride 106 98 - 111 mmol/L   CO2 26 22 - 32 mmol/L   Glucose, Bld 122 (H) 70 - 99 mg/dL   BUN 49 (H) 8 - 23 mg/dL   Creatinine, Ser 0.97 0.44 - 1.00 mg/dL   Calcium 12.3 (H) 8.9 - 10.3 mg/dL   Total Protein 5.9 (L) 6.5 - 8.1 g/dL   Albumin 2.4 (L) 3.5 - 5.0 g/dL   AST 24 15 - 41 U/L   ALT 13 0 - 44 U/L   Alkaline Phosphatase 187 (H) 38 - 126 U/L   Total Bilirubin  0.7 0.3 - 1.2 mg/dL   GFR, Estimated 56 (L) >60 mL/min   Anion gap 8 5 - 15  Lactic acid, plasma     Status: None   Collection Time: 05/23/22  4:00 PM  Result Value Ref Range   Lactic Acid, Venous 1.2 0.5 - 1.9 mmol/L  CBC with Differential     Status: Abnormal   Collection Time: 05/23/22  4:00 PM  Result Value Ref Range   WBC 15.8 (H) 4.0 - 10.5 K/uL   RBC 3.53 (L) 3.87 - 5.11 MIL/uL   Hemoglobin 9.7 (  L) 12.0 - 15.0 g/dL   HCT 31.6 (L) 36.0 - 46.0 %   MCV 89.5 80.0 - 100.0 fL   MCH 27.5 26.0 - 34.0 pg   MCHC 30.7 30.0 - 36.0 g/dL   RDW 14.6 11.5 - 15.5 %   Platelets 455 (H) 150 - 400 K/uL   nRBC 0.0 0.0 - 0.2 %   Neutrophils Relative % 73 %   Neutro Abs 11.5 (H) 1.7 - 7.7 K/uL   Lymphocytes Relative 16 %   Lymphs Abs 2.6 0.7 - 4.0 K/uL   Monocytes Relative 7 %   Monocytes Absolute 1.1 (H) 0.1 - 1.0 K/uL   Eosinophils Relative 2 %   Eosinophils Absolute 0.3 0.0 - 0.5 K/uL   Basophils Relative 0 %   Basophils Absolute 0.1 0.0 - 0.1 K/uL   Immature Granulocytes 2 %   Abs Immature Granulocytes 0.23 (H) 0.00 - 0.07 K/uL  Protime-INR     Status: Abnormal   Collection Time: 05/23/22  4:00 PM  Result Value Ref Range   Prothrombin Time 15.8 (H) 11.4 - 15.2 seconds   INR 1.3 (H) 0.8 - 1.2  Resp panel by RT-PCR (RSV, Flu A&B, Covid) Anterior Nasal Swab     Status: None   Collection Time: 05/23/22  4:02 PM   Specimen: Anterior Nasal Swab  Result Value Ref Range   SARS Coronavirus 2 by RT PCR NEGATIVE NEGATIVE   Influenza A by PCR NEGATIVE NEGATIVE   Influenza B by PCR NEGATIVE NEGATIVE   Resp Syncytial Virus by PCR NEGATIVE NEGATIVE   Recent Results (from the past 240 hour(s))  Resp panel by RT-PCR (RSV, Flu A&B, Covid) Anterior Nasal Swab     Status: None   Collection Time: 05/23/22  4:02 PM   Specimen: Anterior Nasal Swab  Result Value Ref Range Status   SARS Coronavirus 2 by RT PCR NEGATIVE NEGATIVE Final    Comment: (NOTE) SARS-CoV-2 target nucleic acids are NOT  DETECTED.  The SARS-CoV-2 RNA is generally detectable in upper respiratory specimens during the acute phase of infection. The lowest concentration of SARS-CoV-2 viral copies this assay can detect is 138 copies/mL. A negative result does not preclude SARS-Cov-2 infection and should not be used as the sole basis for treatment or other patient management decisions. A negative result may occur with  improper specimen collection/handling, submission of specimen other than nasopharyngeal swab, presence of viral mutation(s) within the areas targeted by this assay, and inadequate number of viral copies(<138 copies/mL). A negative result must be combined with clinical observations, patient history, and epidemiological information. The expected result is Negative.  Fact Sheet for Patients:  EntrepreneurPulse.com.au  Fact Sheet for Healthcare Providers:  IncredibleEmployment.be  This test is no t yet approved or cleared by the [place] FDA and  has been authorized for detection and/or diagnosis of SARS-CoV-2 by FDA under an Emergency Use Authorization (EUA). This EUA will remain  in effect (meaning this test can be used) for the duration of the COVID-19 declaration under Section 564(b)(1) of the Act, 21 U.S.C.section 360bbb-3(b)(1), unless the authorization is terminated  or revoked sooner.       Influenza A by PCR NEGATIVE NEGATIVE Final   Influenza B by PCR NEGATIVE NEGATIVE Final    Comment: (NOTE) The Xpert Xpress SARS-CoV-2/FLU/RSV plus assay is intended as an aid in the diagnosis of influenza from Nasopharyngeal swab specimens and should not be used as a sole basis for treatment. Nasal washings and aspirates are unacceptable  for Xpert Xpress SARS-CoV-2/FLU/RSV testing.  Fact Sheet for Patients: EntrepreneurPulse.com.au  Fact Sheet for Healthcare Providers: IncredibleEmployment.be  This test is not yet  approved or cleared by the [place] FDA and has been authorized for detection and/or diagnosis of SARS-CoV-2 by FDA under an Emergency Use Authorization (EUA). This EUA will remain in effect (meaning this test can be used) for the duration of the COVID-19 declaration under Section 564(b)(1) of the Act, 21 U.S.C. section 360bbb-3(b)(1), unless the authorization is terminated or revoked.     Resp Syncytial Virus by PCR NEGATIVE NEGATIVE Final    Comment: (NOTE) Fact Sheet for Patients: EntrepreneurPulse.com.au  Fact Sheet for Healthcare Providers: IncredibleEmployment.be  This test is not yet approved or cleared by the [place] FDA and has been authorized for detection and/or diagnosis of SARS-CoV-2 by FDA under an Emergency Use Authorization (EUA). This EUA will remain in effect (meaning this test can be used) for the duration of the COVID-19 declaration under Section 564(b)(1) of the Act, 21 U.S.C. section 360bbb-3(b)(1), unless the authorization is terminated or revoked.  Performed at [facility], [place] [address] [place], [place] [address]     Renal Function: Recent Labs    05/23/22 1600  CREATININE 0.97   Estimated Creatinine Clearance: 35.8 mL/min (by C-G formula based on SCr of 0.97 mg/dL).  Radiologic Imaging: DG Chest 2 View  Result Date: 05/23/2022 CLINICAL DATA:  Suspected sepsis. Weakness. Patient had robotic assisted laparoscopic cystectomy with ileal conduit and pelvic lymph node dissection on 05/09/2022. EXAM: CHEST - 2 VIEW COMPARISON:  03/01/2022 FINDINGS: Patient has RIGHT-sided PowerPort, tip overlying the superior vena cava. Heart size is normal. The lungs are free of focal consolidations and pleural effusions. There is no pulmonary edema. There is subcutaneous emphysema in the soft tissues of the UPPER abdomen, likely related to recent laparoscopic procedure. IMPRESSION: 1. No evidence for  acute cardiopulmonary abnormality. 2. Subcutaneous emphysema in the UPPER abdomen, likely related to recent laparoscopic procedure. Consider CT of the abdomen and pelvis as needed. Electronically Signed   By: Nolon Nations M.D.   On: 05/23/2022 16:13    I independently reviewed the above imaging studies.  Impression/Recommendation: 1) UTI/dehydration s/p radical cystectomy:  Agree with treatment of UTI pending culture results considering overall clinical presentation and leukocytosis.  IVF hydration.  Urology will follow to assist in care.  Will notify Dr. Tresa Moore of patient's admission.  Dutch Gray 05/23/2022, 7:08 PM  Pryor Curia. MD   CC: Dr. Posey Pronto

## 2022-05-23 NOTE — Hospital Course (Addendum)
Brandy Wheeler is a 89 y.o. female with a history of bladder cancer s/p robotic cystectomy and ileal conduit diversion, GERD and anemia. Patient presented secondary to altered mental status and found to have evidence of sepsis secondary to UTI. Empiric antibiotics started. Hospitalization complicated by persistent altered mental status and severe hypercalcemia.

## 2022-05-23 NOTE — Progress Notes (Signed)
Pharmacy Antibiotic Note  Brandy Wheeler is a 89 y.o. female admitted on 05/23/2022 with UTI.  Pharmacy has been consulted for cefepime dosing.  s/p radical cystectomy and ileal conduit urinary diversion 1/17 by Dr. 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  AF, WBC 15.8 SCr 0.97 Plan: Cefepime 2 gm IV q12   Height: '5\' 3"'$  (160 cm) Weight: 63 kg (138 lb 14.2 oz) IBW/kg (Calculated) : 52.4  Temp (24hrs), Avg:97.8 F (36.6 C), Min:97.8 F (36.6 C), Max:97.8 F (36.6 C)  Recent Labs  Lab 05/23/22 1600  WBC 15.8*  CREATININE 0.97  LATICACIDVEN 1.2    Estimated Creatinine Clearance: 35.8 mL/min (by C-G formula based on SCr of 0.97 mg/dL).    No Known Allergies  Antimicrobials this admission: 1/31 CTX x 1 1/31 cefepime>> Dose adjustments this admission:  Microbiology results: 1/31 UCx: 1/31 BCx:  Thank you for allowing pharmacy to be a part of this patient's care.  Eudelia Bunch, Pharm.D Use secure chat for questions 05/23/2022 7:34 PM

## 2022-05-23 NOTE — Telephone Encounter (Signed)
Per 1/31 IB, msg left

## 2022-05-23 NOTE — ED Notes (Signed)
ED TO INPATIENT HANDOFF REPORT  ED Nurse Name and Phone #: Baxter Flattery, RN  S Name/Age/Gender Brandy Wheeler 89 y.o. female Room/Bed: WA14/WA14  Code Status   Code Status: Prior  Home/SNF/Other Skilled nursing facility Patient oriented to: self and place Is this baseline? Yes   Triage Complete: Triage complete  Chief Complaint Sepsis due to urinary tract infection ([place]) [A41.9, N39.0]  Triage Note Patient BIB EMS from SNF [facility]) c/o weakness. Per report patient unable to eat or drink x 3 days. Pt a/ox1. Pt hx of Bladder Cancer.   Allergies No Known Allergies  Level of Care/Admitting Diagnosis ED Disposition     ED Disposition  Admit   Condition  --   Comment  Hospital Area: [facility] [[id number]]  Level of Care: Progressive [102]  Admit to Progressive based on following criteria: MULTISYSTEM THREATS such as stable sepsis, metabolic/electrolyte imbalance with or without encephalopathy that is responding to early treatment.  May admit patient to [facility] or [facility] if equivalent level of care is available:: No  Covid Evaluation: Confirmed COVID Negative  Diagnosis: Sepsis due to urinary tract infection [facility]) [[id number]]  Admitting Physician: Lenore Cordia [[id number]]  Attending Physician: Lenore Cordia [[id number]]  Certification:: I certify this patient will need inpatient services for at least 2 midnights  Estimated Length of Stay: 2          B Medical/Surgery History Past Medical History:  Diagnosis Date   Bladder cancer ([place])    Diverticulitis    Fracture of right fibula 10/10/2013   closed   GERD (gastroesophageal reflux disease)    History of hiatal hernia    Hypertension    Past Surgical History:  Procedure Laterality Date   ABDOMINAL HYSTERECTOMY  1978   APPENDECTOMY     AGE 59   [place] Right 10/09/2021   Procedure: POSSIBLE BILATERAL  RETROGRADE POSSIBLE RIGHT Melene Muller STENT;  Surgeon:  Lucas Mallow, MD;  Location: [facility];  Service: Urology;  Laterality: Right;   CYSTOSCOPY WITH INJECTION N/A 05/09/2022   Procedure: CYSTOSCOPY WITH INJECTION OF INDOCYANINE GREEN DYE;  Surgeon: [name], MD;  Location: [facility];  Service: Urology;  Laterality: N/A;   DIAGNOSTIC LAPAROSCOPY  1974   IR IMAGING GUIDED PORT INSERTION  11/15/2021   LYMPH NODE DISSECTION Bilateral 05/09/2022   Procedure: PELVIC LYMPH NODE DISSECTION;  Surgeon: [name], MD;  Location: [facility];  Service: Urology;  Laterality: Bilateral;   ROBOT ASSISTED LAPAROSCOPIC COMPLETE CYSTECT ILEAL CONDUIT N/A 05/09/2022   Procedure: XI ROBOTIC ASSISTED LAPAROSCOPIC COMPLETE CYSTECT ILEAL CONDUIT;  Surgeon: [name], MD;  Location: [facility];  Service: Urology;  Laterality: N/A;  [place]   to lift uterus prior to getting pregnant   TRANSURETHRAL RESECTION OF BLADDER TUMOR Bilateral 10/09/2021   Procedure: TRANSURETHRAL RESECTION OF BLADDER TUMOR (TURBT) POSSIBLE BILATERAL RETROGRADE POSSIBLE RIGHT URETERAL STENT;  Surgeon: Lucas Mallow, MD;  Location: [facility];  Service: Urology;  Laterality: Bilateral;     A IV Location/Drains/Wounds Patient Lines/Drains/Airways Status     Active Line/Drains/Airways     Name Placement date Placement time Site Days   Implanted Port 11/15/21 Right Chest 11/15/21  1158  Chest  189   Closed System Drain 1 Left LLQ Bulb (JP) 19 Fr. 05/09/22  1150  LLQ  14   Urostomy Ileal conduit RUQ 05/09/22  1312  RUQ  14   Ureteral  Drain/Stent Right ureter 7.2 Fr. 05/09/22  1309  Right ureter  14   Ureteral Drain/Stent Left ureter 7.2 Fr. 05/09/22  1249  Left ureter  14   Incision - 3 Ports Abdomen 1: Right;Lateral;Lower 2: Right;Lateral 3: Lateral;Left 05/09/22  1343  -- 14            Intake/Output Last 24 hours No intake or output data in the 24 hours ending 05/23/22 1848  Labs/Imaging Results for orders placed or performed during the hospital  encounter of 05/23/22 (from the past 48 hour(s))  Urinalysis, Routine w reflex microscopic -Urine, Clean Catch     Status: Abnormal   Collection Time: 05/23/22  3:50 PM  Result Value Ref Range   Color, Urine YELLOW YELLOW   APPearance CLOUDY (A) CLEAR   Specific Gravity, Urine 1.013 1.005 - 1.030   pH 7.0 5.0 - 8.0   Glucose, UA NEGATIVE NEGATIVE mg/dL   Hgb urine dipstick MODERATE (A) NEGATIVE   Bilirubin Urine NEGATIVE NEGATIVE   Ketones, ur NEGATIVE NEGATIVE mg/dL   Protein, ur 100 (A) NEGATIVE mg/dL   Nitrite POSITIVE (A) NEGATIVE   Leukocytes,Ua LARGE (A) NEGATIVE   RBC / HPF >50 0 - 5 RBC/hpf   WBC, UA >50 0 - 5 WBC/hpf   Bacteria, UA RARE (A) NONE SEEN   Squamous Epithelial / HPF 0-5 0 - 5 /HPF   WBC Clumps PRESENT    Mucus PRESENT     Comment: Performed at [facility], [place] [address], [place], [place] [address]  Comprehensive metabolic panel     Status: Abnormal   Collection Time: 05/23/22  4:00 PM  Result Value Ref Range   Sodium 140 135 - 145 mmol/L   Potassium 5.2 (H) 3.5 - 5.1 mmol/L   Chloride 106 98 - 111 mmol/L   CO2 26 22 - 32 mmol/L   Glucose, Bld 122 (H) 70 - 99 mg/dL    Comment: Glucose reference range applies only to samples taken after fasting for at least 8 hours.   BUN 49 (H) 8 - 23 mg/dL   Creatinine, Ser 0.97 0.44 - 1.00 mg/dL   Calcium 12.3 (H) 8.9 - 10.3 mg/dL   Total Protein 5.9 (L) 6.5 - 8.1 g/dL   Albumin 2.4 (L) 3.5 - 5.0 g/dL   AST 24 15 - 41 U/L   ALT 13 0 - 44 U/L   Alkaline Phosphatase 187 (H) 38 - 126 U/L   Total Bilirubin 0.7 0.3 - 1.2 mg/dL   GFR, Estimated 56 (L) >60 mL/min    Comment: (NOTE) Calculated using the CKD-EPI Creatinine Equation (2021)    Anion gap 8 5 - 15    Comment: Performed at [facility], [place] [address] [place], [place] [address]  Lactic acid, plasma     Status: None   Collection Time: 05/23/22  4:00 PM  Result Value Ref Range   Lactic Acid, Venous 1.2 0.5 - 1.9 mmol/L     Comment: Performed at [facility], [place] [address], [place], [place] [address]  CBC with Differential     Status: Abnormal   Collection Time: 05/23/22  4:00 PM  Result Value Ref Range   WBC 15.8 (H) 4.0 - 10.5 K/uL   RBC 3.53 (L) 3.87 - 5.11 MIL/uL   Hemoglobin 9.7 (L) 12.0 - 15.0 g/dL   HCT 31.6 (L) 36.0 - 46.0 %   MCV 89.5 80.0 - 100.0 fL   MCH 27.5 26.0 -  34.0 pg   MCHC 30.7 30.0 - 36.0 g/dL   RDW 14.6 11.5 - 15.5 %   Platelets 455 (H) 150 - 400 K/uL   nRBC 0.0 0.0 - 0.2 %   Neutrophils Relative % 73 %   Neutro Abs 11.5 (H) 1.7 - 7.7 K/uL   Lymphocytes Relative 16 %   Lymphs Abs 2.6 0.7 - 4.0 K/uL   Monocytes Relative 7 %   Monocytes Absolute 1.1 (H) 0.1 - 1.0 K/uL   Eosinophils Relative 2 %   Eosinophils Absolute 0.3 0.0 - 0.5 K/uL   Basophils Relative 0 %   Basophils Absolute 0.1 0.0 - 0.1 K/uL   Immature Granulocytes 2 %   Abs Immature Granulocytes 0.23 (H) 0.00 - 0.07 K/uL    Comment: Performed at [facility], [place] [address], [place], [place] [address]  Protime-INR     Status: Abnormal   Collection Time: 05/23/22  4:00 PM  Result Value Ref Range   Prothrombin Time 15.8 (H) 11.4 - 15.2 seconds   INR 1.3 (H) 0.8 - 1.2    Comment: (NOTE) INR goal varies based on device and disease states. Performed at [facility], [place] [address] [place], [place] [address]   Resp panel by RT-PCR (RSV, Flu A&B, Covid) Anterior Nasal Swab     Status: None   Collection Time: 05/23/22  4:02 PM   Specimen: Anterior Nasal Swab  Result Value Ref Range   SARS Coronavirus 2 by RT PCR NEGATIVE NEGATIVE    Comment: (NOTE) SARS-CoV-2 target nucleic acids are NOT DETECTED.  The SARS-CoV-2 RNA is generally detectable in upper respiratory specimens during the acute phase of infection. The lowest concentration of SARS-CoV-2 viral copies this assay can detect is 138 copies/mL. A negative result does not preclude SARS-Cov-2 infection and  should not be used as the sole basis for treatment or other patient management decisions. A negative result may occur with  improper specimen collection/handling, submission of specimen other than nasopharyngeal swab, presence of viral mutation(s) within the areas targeted by this assay, and inadequate number of viral copies(<138 copies/mL). A negative result must be combined with clinical observations, patient history, and epidemiological information. The expected result is Negative.  Fact Sheet for Patients:  EntrepreneurPulse.com.au  Fact Sheet for Healthcare Providers:  IncredibleEmployment.be  This test is no t yet approved or cleared by the [place] FDA and  has been authorized for detection and/or diagnosis of SARS-CoV-2 by FDA under an Emergency Use Authorization (EUA). This EUA will remain  in effect (meaning this test can be used) for the duration of the COVID-19 declaration under Section 564(b)(1) of the Act, 21 U.S.C.section 360bbb-3(b)(1), unless the authorization is terminated  or revoked sooner.       Influenza A by PCR NEGATIVE NEGATIVE   Influenza B by PCR NEGATIVE NEGATIVE    Comment: (NOTE) The Xpert Xpress SARS-CoV-2/FLU/RSV plus assay is intended as an aid in the diagnosis of influenza from Nasopharyngeal swab specimens and should not be used as a sole basis for treatment. Nasal washings and aspirates are unacceptable for Xpert Xpress SARS-CoV-2/FLU/RSV testing.  Fact Sheet for Patients: EntrepreneurPulse.com.au  Fact Sheet for Healthcare Providers: IncredibleEmployment.be  This test is not yet approved or cleared by the [place] FDA and has been authorized for detection and/or diagnosis of SARS-CoV-2 by FDA under an Emergency Use Authorization (EUA). This EUA will remain in effect (meaning this test can be used) for the duration of the COVID-19  declaration under Section  564(b)(1) of the Act, 21 U.S.C. section 360bbb-3(b)(1), unless the authorization is terminated or revoked.     Resp Syncytial Virus by PCR NEGATIVE NEGATIVE    Comment: (NOTE) Fact Sheet for Patients: EntrepreneurPulse.com.au  Fact Sheet for Healthcare Providers: IncredibleEmployment.be  This test is not yet approved or cleared by the [place] FDA and has been authorized for detection and/or diagnosis of SARS-CoV-2 by FDA under an Emergency Use Authorization (EUA). This EUA will remain in effect (meaning this test can be used) for the duration of the COVID-19 declaration under Section 564(b)(1) of the Act, 21 U.S.C. section 360bbb-3(b)(1), unless the authorization is terminated or revoked.  Performed at [facility], [place] [address] [place], [place] [address]    DG Chest 2 View  Result Date: 05/23/2022 CLINICAL DATA:  Suspected sepsis. Weakness. Patient had robotic assisted laparoscopic cystectomy with ileal conduit and pelvic lymph node dissection on 05/09/2022. EXAM: CHEST - 2 VIEW COMPARISON:  03/01/2022 FINDINGS: Patient has RIGHT-sided PowerPort, tip overlying the superior vena cava. Heart size is normal. The lungs are free of focal consolidations and pleural effusions. There is no pulmonary edema. There is subcutaneous emphysema in the soft tissues of the UPPER abdomen, likely related to recent laparoscopic procedure. IMPRESSION: 1. No evidence for acute cardiopulmonary abnormality. 2. Subcutaneous emphysema in the UPPER abdomen, likely related to recent laparoscopic procedure. Consider CT of the abdomen and pelvis as needed. Electronically Signed   By: Nolon Nations M.D.   On: 05/23/2022 16:13    Pending Labs Unresulted Labs (From admission, onward)     Start     Ordered   05/23/22 1843  Urine Culture (for pregnant, neutropenic or urologic patients or patients with an indwelling urinary catheter)  (Urine Labs)   Add-on,   AD       Question:  Indication  Answer:  Sepsis   05/23/22 1842   05/23/22 1843  Culture, blood (Routine X 2) w Reflex to ID Panel  BLOOD CULTURE X 2,   R (with TIMED occurrences)      05/23/22 1842            Vitals/Pain Today's Vitals   05/23/22 1710 05/23/22 1730 05/23/22 1800 05/23/22 1830  BP: (!) 141/86 139/66 (!) 113/94 (!) 137/109  Pulse: (!) 112 (!) 106 (!) 111 (!) 112  Resp: (!) 39  (!) 32 (!) 31  Temp:      TempSrc:      SpO2: 100% 100% 100% 100%  Weight:      Height:        Isolation Precautions No active isolations  Medications Medications  sodium chloride 0.9 % bolus 1,000 mL (0 mLs Intravenous Stopped 05/23/22 1820)  cefTRIAXone (ROCEPHIN) 2 g in sodium chloride 0.9 % 100 mL IVPB (0 g Intravenous Stopped 05/23/22 1740)    Mobility non-ambulatory     Focused Assessments Cardiac Assessment Handoff:    No results found for: "CKTOTAL", "CKMB", "CKMBINDEX", "TROPONINI" Lab Results  Component Value Date   DDIMER 1.63 (H) 04/29/2022   Does the Patient currently have chest pain? No    R Recommendations: See Admitting Provider Note  Report given to:   Additional Notes:

## 2022-05-23 NOTE — ED Provider Notes (Signed)
[facility] Provider Note   CSN: [id number] Arrival date & time: 05/23/22  1527     History  Chief Complaint  Patient presents with   Failure To [place] is a 89 y.o. female.  Patient has history of hypertension and recently had cystectomy done on January 17.  She has a conduit in place.  She went to the rehab center on January 25.  Last 3 days she has been not eating or drinking.  She is also weak  The history is provided by the patient and medical records. No language interpreter was used.  Weakness Severity:  Moderate Onset quality:  Sudden Timing:  Constant Progression:  Partially resolved Chronicity:  Recurrent Context: not alcohol use   Relieved by:  Nothing Worsened by:  Nothing Ineffective treatments:  None tried Associated symptoms: no abdominal pain, no chest pain, no cough, no diarrhea, no frequency, no headaches and no seizures        Home Medications Prior to Admission medications   Medication Sig Start Date End Date Taking? Authorizing Provider  Cholecalciferol (VITAMIN D3 PO) Take 1 tablet by mouth in the morning.    [provider]  Cyanocobalamin (VITAMIN B-12 PO) Take 1 tablet by mouth in the morning.    [provider]  Multiple Minerals-Vitamins (BONE DENSITY BUILDER PO) Take 1 tablet by mouth in the morning. [employer] Bone Social worker, Historical, MD  omeprazole (PRILOSEC) 20 MG capsule Take 20 mg by mouth in the morning.    [provider]  oxyCODONE-acetaminophen (PERCOCET) 5-325 MG tablet Take 1 tablet by mouth every 6 (six) hours as needed for severe pain or moderate pain (post-operatively). 05/17/22   [name], MD  potassium chloride (KLOR-CON M) 10 MEQ tablet Take 2 tablets (20 mEq total) by mouth 2 (two) times daily for 7 days. 04/29/22 05/06/22  Molpus, Jenny Reichmann, MD      Allergies    Patient has no known allergies.    Review of Systems    Review of Systems  Constitutional:  Negative for appetite change and fatigue.  HENT:  Negative for congestion, ear discharge and sinus pressure.   Eyes:  Negative for discharge.  Respiratory:  Negative for cough.   Cardiovascular:  Negative for chest pain.  Gastrointestinal:  Negative for abdominal pain and diarrhea.  Genitourinary:  Negative for frequency and hematuria.  Musculoskeletal:  Negative for back pain.  Skin:  Negative for rash.  Neurological:  Positive for weakness. Negative for seizures and headaches.  Psychiatric/Behavioral:  Negative for hallucinations.     Physical Exam Updated Vital Signs BP (!) 113/94   Pulse (!) 111   Temp 97.8 F (36.6 C) (Oral)   Resp (!) 32   Ht '5\' 3"'$  (1.6 m)   Wt 63 kg   SpO2 100%   BMI 24.60 kg/m  Physical Exam Vitals and nursing note reviewed.  Constitutional:      Appearance: She is well-developed.  HENT:     Head: Normocephalic.     Nose: Nose normal.     Mouth/Throat:     Mouth: Mucous membranes are dry.  Eyes:     General: No scleral icterus.    Conjunctiva/sclera: Conjunctivae normal.  Neck:     Thyroid: No thyromegaly.  Cardiovascular:     Rate and Rhythm: Normal rate and regular rhythm.     Heart sounds: No murmur heard.    No friction  rub. No gallop.  Pulmonary:     Breath sounds: No stridor. No wheezing or rales.  Chest:     Chest wall: No tenderness.  Abdominal:     General: There is no distension.     Tenderness: There is no abdominal tenderness. There is no rebound.  Musculoskeletal:        General: Normal range of motion.     Cervical back: Neck supple.  Lymphadenopathy:     Cervical: No cervical adenopathy.  Skin:    Findings: No erythema or rash.  Neurological:     Mental Status: She is alert and oriented to person, place, and time.     Motor: No abnormal muscle tone.     Coordination: Coordination normal.  Psychiatric:        Behavior: Behavior normal.     ED Results / Procedures /  Treatments   Labs (all labs ordered are listed, but only abnormal results are displayed) Labs Reviewed  COMPREHENSIVE METABOLIC PANEL - Abnormal; Notable for the following components:      Result Value   Potassium 5.2 (*)    Glucose, Bld 122 (*)    BUN 49 (*)    Calcium 12.3 (*)    Total Protein 5.9 (*)    Albumin 2.4 (*)    Alkaline Phosphatase 187 (*)    GFR, Estimated 56 (*)    All other components within normal limits  CBC WITH DIFFERENTIAL/PLATELET - Abnormal; Notable for the following components:   WBC 15.8 (*)    RBC 3.53 (*)    Hemoglobin 9.7 (*)    HCT 31.6 (*)    Platelets 455 (*)    Neutro Abs 11.5 (*)    Monocytes Absolute 1.1 (*)    Abs Immature Granulocytes 0.23 (*)    All other components within normal limits  PROTIME-INR - Abnormal; Notable for the following components:   Prothrombin Time 15.8 (*)    INR 1.3 (*)    All other components within normal limits  URINALYSIS, ROUTINE W REFLEX MICROSCOPIC - Abnormal; Notable for the following components:   APPearance CLOUDY (*)    Hgb urine dipstick MODERATE (*)    Protein, ur 100 (*)    Nitrite POSITIVE (*)    Leukocytes,Ua LARGE (*)    Bacteria, UA RARE (*)    All other components within normal limits  RESP PANEL BY RT-PCR (RSV, FLU A&B, COVID)  [id number]  LACTIC ACID, PLASMA    EKG None  Radiology DG Chest 2 View  Result Date: 05/23/2022 CLINICAL DATA:  Suspected sepsis. Weakness. Patient had robotic assisted laparoscopic cystectomy with ileal conduit and pelvic lymph node dissection on 05/09/2022. EXAM: CHEST - 2 VIEW COMPARISON:  03/01/2022 FINDINGS: Patient has RIGHT-sided PowerPort, tip overlying the superior vena cava. Heart size is normal. The lungs are free of focal consolidations and pleural effusions. There is no pulmonary edema. There is subcutaneous emphysema in the soft tissues of the UPPER abdomen, likely related to recent laparoscopic procedure. IMPRESSION: 1. No evidence for acute cardiopulmonary  abnormality. 2. Subcutaneous emphysema in the UPPER abdomen, likely related to recent laparoscopic procedure. Consider CT of the abdomen and pelvis as needed. Electronically Signed   By: Nolon Nations M.D.   On: 05/23/2022 16:13    Procedures Procedures    Medications Ordered in ED Medications  sodium chloride 0.9 % bolus 1,000 mL (0 mLs Intravenous Stopped 05/23/22 1820)  cefTRIAXone (ROCEPHIN) 2 g in sodium chloride 0.9 % 100 mL IVPB (  0 g Intravenous Stopped 05/23/22 1740)    ED Course/ Medical Decision Making/ A&P  I spoke with urology Dr. Alinda Money and he will consult on the patient                            Medical Decision Making Amount and/or Complexity of Data Reviewed Labs: ordered. Radiology: ordered.  Risk Decision regarding hospitalization.  This patient presents to the ED for concern of dehydration, this involves an extensive number of treatment options, and is a complaint that carries with it a high risk of complications and morbidity.  The differential diagnosis includes UTI, dehydration   Co morbidities that complicate the patient evaluation  Hypertension, cystectomy   Additional history obtained:  Additional history obtained from patient External records from outside source obtained and reviewed including hospital records   Lab Tests:  I Ordered, and personally interpreted labs.  The pertinent results include: UA suggest UTI, white count 15.8   Imaging Studies ordered:  I ordered imaging studies including chest x-ray I independently visualized and interpreted imaging which showed subcutaneous emphysema I agree with the radiologist interpretation   Cardiac Monitoring: / EKG:  The patient was maintained on a cardiac monitor.  I personally viewed and interpreted the cardiac monitored which showed an underlying rhythm of: Normal sinus rhythm   Consultations Obtained:  I requested consultation with the urology and hospitalist,  and discussed lab and  imaging findings as well as pertinent plan - they recommend: Admit to hospitalist with urology consult   Problem List / ED Course / Critical interventions / Medication management  Status post cystectomy, urinary tract infection I ordered medication including normal saline and antibiotics Reevaluation of the patient after these medicines showed that the patient improved I have reviewed the patients home medicines and have made adjustments as needed   Social Determinants of Health:  None   Test / Admission - Considered:  None  Patient with an AKI and UTI.  She will be admitted to medicine and hydrated and started on antibiotics.  Urology will consult         Final Clinical Impression(s) / ED Diagnoses Final diagnoses:  Acute cystitis with hematuria  AKI (acute kidney injury) [facility])    Rx / DC Orders ED Discharge Orders     None         Milton Ferguson, MD 05/25/22 1743

## 2022-05-24 ENCOUNTER — Inpatient Hospital Stay (HOSPITAL_COMMUNITY): Payer: Medicare HMO

## 2022-05-24 DIAGNOSIS — G9341 Metabolic encephalopathy: Secondary | ICD-10-CM | POA: Diagnosis not present

## 2022-05-24 DIAGNOSIS — E875 Hyperkalemia: Secondary | ICD-10-CM

## 2022-05-24 DIAGNOSIS — A419 Sepsis, unspecified organism: Secondary | ICD-10-CM | POA: Diagnosis not present

## 2022-05-24 DIAGNOSIS — E43 Unspecified severe protein-calorie malnutrition: Secondary | ICD-10-CM

## 2022-05-24 LAB — COMPREHENSIVE METABOLIC PANEL
ALT: 10 U/L (ref 0–44)
AST: 21 U/L (ref 15–41)
Albumin: 2.1 g/dL — ABNORMAL LOW (ref 3.5–5.0)
Alkaline Phosphatase: 159 U/L — ABNORMAL HIGH (ref 38–126)
Anion gap: 8 (ref 5–15)
BUN: 46 mg/dL — ABNORMAL HIGH (ref 8–23)
CO2: 22 mmol/L (ref 22–32)
Calcium: 11.7 mg/dL — ABNORMAL HIGH (ref 8.9–10.3)
Chloride: 111 mmol/L (ref 98–111)
Creatinine, Ser: 0.99 mg/dL (ref 0.44–1.00)
GFR, Estimated: 55 mL/min — ABNORMAL LOW (ref >60)
Glucose, Bld: 112 mg/dL — ABNORMAL HIGH (ref 70–99)
Potassium: 4.9 mmol/L (ref 3.5–5.1)
Sodium: 141 mmol/L (ref 135–145)
Total Bilirubin: 0.6 mg/dL (ref 0.3–1.2)
Total Protein: 5.6 g/dL — ABNORMAL LOW (ref 6.5–8.1)

## 2022-05-24 LAB — CBC
HCT: 30.9 % — ABNORMAL LOW (ref 36.0–46.0)
Hemoglobin: 9.2 g/dL — ABNORMAL LOW (ref 12.0–15.0)
MCH: 27.3 pg (ref 26.0–34.0)
MCHC: 29.8 g/dL — ABNORMAL LOW (ref 30.0–36.0)
MCV: 91.7 fL (ref 80.0–100.0)
Platelets: 408 10*3/uL — ABNORMAL HIGH (ref 150–400)
RBC: 3.37 MIL/uL — ABNORMAL LOW (ref 3.87–5.11)
RDW: 14.8 % (ref 11.5–15.5)
WBC: 11.8 10*3/uL — ABNORMAL HIGH (ref 4.0–10.5)
nRBC: 0 % (ref 0.0–0.2)

## 2022-05-24 LAB — BLOOD GAS, ARTERIAL
Acid-base deficit: 1.7 mmol/L (ref 0.0–2.0)
Bicarbonate: 23 mmol/L (ref 20.0–28.0)
O2 Saturation: 96.8 %
Patient temperature: 38.2
pCO2 arterial: 40 mmHg (ref 32–48)
pH, Arterial: 7.37 (ref 7.35–7.45)
pO2, Arterial: 81 mmHg — ABNORMAL LOW (ref 83–108)

## 2022-05-24 LAB — GLUCOSE, CAPILLARY: Glucose-Capillary: 125 mg/dL — ABNORMAL HIGH (ref 70–99)

## 2022-05-24 LAB — PROCALCITONIN: Procalcitonin: <0.1 ng/mL

## 2022-05-24 MED ORDER — PANTOPRAZOLE SODIUM 20 MG PO TBEC
20.0000 mg | DELAYED_RELEASE_TABLET | Freq: Every day | ORAL | Status: DC
  Filled 2022-05-24 (×2): qty 1

## 2022-05-24 MED ORDER — CHLORHEXIDINE GLUCONATE CLOTH 2 % EX PADS
6.0000 | MEDICATED_PAD | Freq: Every day | CUTANEOUS | Status: DC
  Administered 2022-05-24 – 2022-06-06 (×14): 6 via TOPICAL

## 2022-05-24 MED ORDER — METOPROLOL TARTRATE 5 MG/5ML IV SOLN
5.0000 mg | Freq: Once | INTRAVENOUS | Status: AC
  Administered 2022-05-24: 5 mg via INTRAVENOUS
  Filled 2022-05-24: qty 5

## 2022-05-24 MED ORDER — SODIUM CHLORIDE 0.9 % IV SOLN: INTRAVENOUS | Status: DC

## 2022-05-24 NOTE — Progress Notes (Addendum)
PROGRESS NOTE    Brandy Wheeler  OMB:[id number] DOB: 04/09/1932 DOA: 05/23/2022 PCP: Pcp, No   Brief Narrative: Brandy Wheeler is a 89 y.o. female with a history of bladder cancer s/p robotic cystectomy and ileal conduit diversion, GERD and anemia. Patient presented secondary to altered mental status and found to have evidence of sepsis secondary to UTI. Empiric antibiotics started.   Assessment and Plan:  Acute metabolic encephalopathy Present on admission. Likely secondary to UTI. CT head unremarkable for acute process.  Sepsis Present on admission. Secondary to UTI. Blood and urine cultures obtained on admission and are pending. Empiric antibiotics started. -Follow-up culture data  UTI Urinalysis suggests UTI. Complicated by recent urologic procedure. Patient started empirically on Cefepime -Continue Cefepime for now -Follow-up urine culture data  Hypercalcemia Unclear etiology, but possibly secondary to dehydration but could be related to underlying cancer. Patient is also on vitamin D supplementation. Calcium of 12.3 on admission. Improved to 11.7 today -Continue IV fluids -Repeat BMP in AM -Check vitamin D levels and PTH  Hyperkalemia Mild. Resolved.  GERD -Continue PPI (Protonix)  Anemia Appears to be post-operative. Stable.  Left tib/fib fracture Patient follows with orthopedic surgery and has a casted left leg. She is non-weight bearing of that leg -PT/OT eval tomorrow  Right ankle/leg pain Unclear etiology -Right tib/fib and ankle x-rays to rule out fracture  Oligometastatic bladder cancer Patient follows with oncology and urology. S/p robotic cystectomy with node dissection and conduit diversion.  Severe malnutrition Dietitian recommendations: Prosource Plus PO TID, each provides 100 kcals and 15g protein  Magic cup TID with meals, each supplement provides 290 kcal and 9 grams of protein  Multivitamin with minerals daily  DVT prophylaxis: Heparin  subq Code Status:   Code Status: Full Code Family Communication: Daughter and niece at bedside Disposition Plan: Discharge back to SNF likely in 2-3 days pending improvement of mental status   Consultants:  Urology  Procedures:  None  Antimicrobials: Cefepime  Ceftriaxone   Subjective: Patient has right ankle and right leg pain. No concerns.  Objective: BP (!) 140/64 (BP Location: Left Arm)   Pulse (!) 114   Temp 97.7 F (36.5 C) (Axillary)   Resp 20   Ht '5\' 3"'$  (1.6 m)   Wt 63 kg   SpO2 97%   BMI 24.60 kg/m   Examination:  General exam: Appears calm and comfortable Respiratory system: Clear to auscultation. Respiratory effort normal. Cardiovascular system: S1 & S2 heard, RRR. No murmurs, rubs, gallops or clicks. Gastrointestinal system: Abdomen is nondistended, soft and nontender. No organomegaly or masses felt. Normal bowel sounds heard. Central nervous system: Alert and oriented to person only.  Musculoskeletal: Left ankle/lower leg in cast. Tenderness of right ankle and over right tibia with overlying mass noted Skin: No cyanosis. No rashes Psychiatry: Judgement and insight appear normal. Mood & affect appropriate.    Data Reviewed: I have personally reviewed following labs and imaging studies  CBC Lab Results  Component Value Date   WBC 11.8 (H) 05/24/2022   RBC 3.37 (L) 05/24/2022   HGB 9.2 (L) 05/24/2022   HCT 30.9 (L) 05/24/2022   MCV 91.7 05/24/2022   MCH 27.3 05/24/2022   PLT 408 (H) 05/24/2022   MCHC 29.8 (L) 05/24/2022   RDW 14.8 05/24/2022   LYMPHSABS 2.6 05/23/2022   MONOABS 1.1 (H) 05/23/2022   EOSABS 0.3 05/23/2022   BASOSABS 0.1 45/36/4680     Last metabolic panel Lab Results  Component Value Date  NA 141 05/24/2022   K 4.9 05/24/2022   CL 111 05/24/2022   CO2 22 05/24/2022   BUN 46 (H) 05/24/2022   CREATININE 0.99 05/24/2022   GLUCOSE 112 (H) 05/24/2022   GFRNONAA 55 (L) 05/24/2022   CALCIUM 11.7 (H) 05/24/2022   PROT 5.6  (L) 05/24/2022   ALBUMIN 2.1 (L) 05/24/2022   BILITOT 0.6 05/24/2022   ALKPHOS 159 (H) 05/24/2022   AST 21 05/24/2022   ALT 10 05/24/2022   ANIONGAP 8 05/24/2022    GFR: Estimated Creatinine Clearance: 35.1 mL/min (by C-G formula based on SCr of 0.99 mg/dL).  Recent Results (from the past 240 hour(s))  Culture, blood (Routine X 2) w Reflex to ID Panel     Status: None (Preliminary result)   Collection Time: 05/23/22  4:00 PM   Specimen: BLOOD  Result Value Ref Range Status   Specimen Description   Final    BLOOD PORTA CATH Performed at [facility] [address], [place], [place] [address]    Special Requests   Final    BOTTLES DRAWN AEROBIC AND ANAEROBIC Blood Culture results may not be optimal due to an inadequate volume of blood received in culture bottles Performed at [facility] [address], [place], [place] [address]    Culture   Final    NO GROWTH < 12 HOURS Performed at [facility] [address], [place],  [address]    Report Status PENDING  Incomplete  Resp panel by RT-PCR (RSV, Flu A&B, Covid) Anterior Nasal Swab     Status: None   Collection Time: 05/23/22  4:02 PM   Specimen: Anterior Nasal Swab  Result Value Ref Range Status   SARS Coronavirus 2 by RT PCR NEGATIVE NEGATIVE Final    Comment: (NOTE) SARS-CoV-2 target nucleic acids are NOT DETECTED.  The SARS-CoV-2 RNA is generally detectable in upper respiratory specimens during the acute phase of infection. The lowest concentration of SARS-CoV-2 viral copies this assay can detect is 138 copies/mL. A negative result does not preclude SARS-Cov-2 infection and should not be used as the sole basis for treatment or other patient management decisions. A negative result may occur with  improper specimen collection/handling, submission of specimen other than nasopharyngeal swab, presence of viral mutation(s) within the areas targeted by this assay, and  inadequate number of viral copies(<138 copies/mL). A negative result must be combined with clinical observations, patient history, and epidemiological information. The expected result is Negative.  Fact Sheet for Patients:  EntrepreneurPulse.com.au  Fact Sheet for Healthcare Providers:  IncredibleEmployment.be  This test is no t yet approved or cleared by the [place] FDA and  has been authorized for detection and/or diagnosis of SARS-CoV-2 by FDA under an Emergency Use Authorization (EUA). This EUA will remain  in effect (meaning this test can be used) for the duration of the COVID-19 declaration under Section 564(b)(1) of the Act, 21 U.S.C.section 360bbb-3(b)(1), unless the authorization is terminated  or revoked sooner.       Influenza A by PCR NEGATIVE NEGATIVE Final   Influenza B by PCR NEGATIVE NEGATIVE Final    Comment: (NOTE) The Xpert Xpress SARS-CoV-2/FLU/RSV plus assay is intended as an aid in the diagnosis of influenza from Nasopharyngeal swab specimens and should not be used as a sole basis for treatment. Nasal washings and aspirates are unacceptable for Xpert Xpress SARS-CoV-2/FLU/RSV testing.  Fact Sheet for Patients: EntrepreneurPulse.com.au  Fact Sheet for Healthcare Providers: IncredibleEmployment.be  This  test is not yet approved or cleared by the [place] and has been authorized for detection and/or diagnosis of SARS-CoV-2 by FDA under an Emergency Use Authorization (EUA). This EUA will remain in effect (meaning this test can be used) for the duration of the COVID-19 declaration under Section 564(b)(1) of the Act, 21 U.S.C. section 360bbb-3(b)(1), unless the authorization is terminated or revoked.     Resp Syncytial Virus by PCR NEGATIVE NEGATIVE Final    Comment: (NOTE) Fact Sheet for Patients: EntrepreneurPulse.com.au  Fact Sheet for Healthcare  Providers: IncredibleEmployment.be  This test is not yet approved or cleared by the [place] FDA and has been authorized for detection and/or diagnosis of SARS-CoV-2 by FDA under an Emergency Use Authorization (EUA). This EUA will remain in effect (meaning this test can be used) for the duration of the COVID-19 declaration under Section 564(b)(1) of the Act, 21 U.S.C. section 360bbb-3(b)(1), unless the authorization is terminated or revoked.  Performed at [facility], [place] [address] [place], [place] [address]   Culture, blood (Routine X 2) w Reflex to ID Panel     Status: None (Preliminary result)   Collection Time: 05/23/22  9:47 PM   Specimen: BLOOD  Result Value Ref Range Status   Specimen Description   Final    BLOOD BLOOD RIGHT HAND Performed at [facility] [address], [place], [place] [address]    Special Requests   Final    BOTTLES DRAWN AEROBIC ONLY Blood Culture results may not be optimal due to an inadequate volume of blood received in culture bottles Performed at [facility] [address], [place], [place] [address]    Culture   Final    NO GROWTH < 12 HOURS Performed at [facility] [address], [place], [place] [address]    Report Status PENDING  Incomplete      Radiology Studies: CT ABDOMEN PELVIS W CONTRAST  Result Date: 05/23/2022 CLINICAL DATA:  Postoperative abdominal pain. Radical cystectomy and ileal conduit performed on 05/09/2022. Urothelial carcinoma of the bladder. EXAM: CT ABDOMEN AND PELVIS WITH CONTRAST TECHNIQUE: Multidetector CT imaging of the abdomen and pelvis was performed using the standard protocol following bolus administration of intravenous contrast. RADIATION DOSE REDUCTION: This exam was performed according to the departmental dose-optimization program which includes automated exposure control, adjustment of the mA and/or kV according to  patient size and/or use of iterative reconstruction technique. CONTRAST:  73m OMNIPAQUE IOHEXOL 300 MG/ML  SOLN COMPARISON:  CT abdomen and pelvis 04/29/2022. CT chest abdomen and pelvis 03/01/2022. FINDINGS: Lower chest: Chest wall emphysema present compatible with recent surgery. There is a new 5 mm left lower lobe nodule. Hepatobiliary: No focal liver abnormality is seen. No gallstones, gallbladder wall thickening, or biliary dilatation. Pancreas: Unremarkable. No pancreatic ductal dilatation or surrounding inflammatory changes. Spleen: Normal in size without focal abnormality. Adrenals/Urinary Tract: Patient is status post cystectomy with new right-sided abdominal ileal conduit/urinary diversion. There is mild bilateral hydronephrosis without obstructing calculus. Bilateral ureteral stents are present. There is no perinephric fluid collection or stranding. There is normal renal enhancement bilaterally. The adrenal glands are within normal limits. Stomach/Bowel: Appendix is not seen. No evidence of bowel wall thickening, distention, or inflammatory changes. There is a moderate-sized hiatal hernia. Stomach is nondilated. There is sigmoid colon diverticulosis. Vascular/Lymphatic: Aortic atherosclerosis. No enlarged abdominal or pelvic lymph nodes. Reproductive: Status post hysterectomy. No adnexal masses. Other: There is extensive subcutaneous abdominal and pelvic  wall emphysema compatible with recent surgery. There is no abdominal wall hernia. No focal fluid collections are identified. Musculoskeletal: Degenerative changes affect the spine. IMPRESSION: 1. Patient is status post cystectomy with right-sided ileal conduit/urinary diversion. There is mild bilateral hydronephrosis without obstructing calculus. Bilateral ureteral stents are in place. 2. Extensive subcutaneous emphysema compatible with recent surgery. 3. Moderate-sized hiatal hernia. 4. Aortic atherosclerosis. 5. There is a new 5 mm left lower lobe  nodule. Metastatic disease not excluded. Aortic Atherosclerosis (ICD10-I70.0). Electronically Signed   By: Ronney Asters M.D.   On: 05/23/2022 21:20   DG Chest 2 View  Result Date: 05/23/2022 CLINICAL DATA:  Suspected sepsis. Weakness. Patient had robotic assisted laparoscopic cystectomy with ileal conduit and pelvic lymph node dissection on 05/09/2022. EXAM: CHEST - 2 VIEW COMPARISON:  03/01/2022 FINDINGS: Patient has RIGHT-sided PowerPort, tip overlying the superior vena cava. Heart size is normal. The lungs are free of focal consolidations and pleural effusions. There is no pulmonary edema. There is subcutaneous emphysema in the soft tissues of the UPPER abdomen, likely related to recent laparoscopic procedure. IMPRESSION: 1. No evidence for acute cardiopulmonary abnormality. 2. Subcutaneous emphysema in the UPPER abdomen, likely related to recent laparoscopic procedure. Consider CT of the abdomen and pelvis as needed. Electronically Signed   By: Nolon Nations M.D.   On: 05/23/2022 16:13      LOS: 1 day    Cordelia Poche, MD [facility] 05/24/2022, 12:13 PM   If 7PM-7AM, please contact night-coverage [email or web address]

## 2022-05-24 NOTE — Progress Notes (Signed)
Subjective/Chief Complaint:  1 - Oligometastatic Bladder Cancer - s/p robotic cystectomy / node dissection / conduit diversion 1/17/2 for pT4N1Mx high grade urothelial cancer. Margins negative.  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 BUN 40's some mental status changes. CT w/o fluid collections or complicating factors, peri-op stents in expected position. Pct <.10. UA with expected small bacteruria but no fevers. Brandy Wheeler 1/31 pending. Pulm screen negative. Placed on empiric maxipime.   Today "Brandy Wheeler" is improving by objective measures. No fevers. Pct favorable. BUN trending down a bit. She continues to have sig fatigue and certainly not at baseline.   Objective: Vital signs in last 24 hours: Temp:  [97.7 F (36.5 C)-98.3 F (36.8 C)] 97.7 F (36.5 C) (02/01 1041) Pulse Rate:  [106-119] 114 (02/01 1041) Resp:  [16-39] 20 (02/01 1041) BP: (113-155)/(51-109) 140/64 (02/01 1041) SpO2:  [97 %-100 %] 97 % (02/01 1041) Weight:  [63 kg] 63 kg (01/31 1543) Last BM Date :  (uta)  Intake/Output from previous day: 01/31 0701 - 02/01 0700 In: 697.1 [I.V.:597.1; IV Piggyback:100] Out: -  Intake/Output this shift: Total I/O In: 802 [P.O.:237; I.V.:465; IV Piggyback:100] Out: 950 [Urine:950]  EXAM: NAD, Resting, arousable but only with stimulation. AOx1 at present Non-labored breathing on RA Stable regular tachycardia low 100s SNTND, RLQ Urostomy pink and patent of non-foul urine and scant mucus with Rt (red) and Lt (blue) bander stents No CVAT   Lab Results:  Recent Labs    05/23/22 1600 05/24/22 0724  WBC 15.8* 11.8*  HGB 9.7* 9.2*  HCT 31.6* 30.9*  PLT 455* 408*   BMET Recent Labs    05/23/22 1600 05/24/22 0724  NA 140 141  K 5.2* 4.9  CL 106 111  CO2 26 22  GLUCOSE 122* 112*  BUN 49* 46*  CREATININE 0.97 0.99  CALCIUM 12.3* 11.7*   PT/INR Recent Labs     05/23/22 1600  LABPROT 15.8*  INR 1.3*   ABG No results for input(s): "PHART", "HCO3" in the last 72 hours.  Invalid input(s): "PCO2", "PO2"  Studies/Results: DG Tibia/Fibula Right  Result Date: 05/24/2022 CLINICAL DATA:  Fall.  Right leg pain.  Right ankle pain. EXAM: RIGHT TIBIA AND FIBULA - 2 VIEW; RIGHT ANKLE - COMPLETE 3+ VIEW COMPARISON:  Right ankle radiographs 11/23/2013, 10/27/2013, and 10/10/2013; CT abdomen and pelvis 05/23/2022 FINDINGS: Right ankle: Interval healing of the previously seen oblique distal fibular metadiaphyseal fracture. There is increased periosteal thickening of the anterior aspect of the distal fibular diaphysis, presumably from healing of that fracture. Minimal persistent fracture line lucency is seen in the region of the prior 2015 acute fracture, presumably now the patient's chronic baseline. The ankle mortise is symmetric and intact. Small plantar calcaneal heel spur. No acute fracture is seen. Moderate talonavicular joint space narrowing. Right tibia and fibula: No acute fracture is seen within the more proximal right tibia or fibula. There is mild cortical lucency/thinning of the anterior tibial diaphyseal cortex at mid height. Mild-to-moderate medial compartment of the knee joint space narrowing. There is moderate scattered subcutaneous air about the posteromedial greater than the lateral aspect of the partially visualized distal right thigh. Note is made of extensive subcutaneous anterior abdominopelvic wall emphysema seen extending along the anterior pelvis and bilateral proximal thighs on 05/23/2022 CT, presumably extending down the right thigh to the right knee on the current radiographs. IMPRESSION: Compared to 11/23/2013  1. Interval healing of previously seen distal fibular metadiaphyseal fracture. Minimal persistent fracture line lucency is seen in the region of the prior 2015 acute fracture, presumably now the patient's chronic baseline. 2. There is lucency  with possible mild "hair on end" periosteal reaction within the anterolateral aspect of the right tibial diaphysis that is nonspecific. This may be secondary to chronic stress related changes or infectious/inflammatory process. This may be subacute to chronic posttraumatic change. It is difficult to exclude an infectious or malignant etiology. CT or MRI of the right tibia may be helpful for further evaluation. Electronically Signed   By: Yvonne Kendall M.D.   On: 05/24/2022 13:47   DG Ankle Complete Right  Result Date: 05/24/2022 CLINICAL DATA:  Fall.  Right leg pain.  Right ankle pain. EXAM: RIGHT TIBIA AND FIBULA - 2 VIEW; RIGHT ANKLE - COMPLETE 3+ VIEW COMPARISON:  Right ankle radiographs 11/23/2013, 10/27/2013, and 10/10/2013; CT abdomen and pelvis 05/23/2022 FINDINGS: Right ankle: Interval healing of the previously seen oblique distal fibular metadiaphyseal fracture. There is increased periosteal thickening of the anterior aspect of the distal fibular diaphysis, presumably from healing of that fracture. Minimal persistent fracture line lucency is seen in the region of the prior 2015 acute fracture, presumably now the patient's chronic baseline. The ankle mortise is symmetric and intact. Small plantar calcaneal heel spur. No acute fracture is seen. Moderate talonavicular joint space narrowing. Right tibia and fibula: No acute fracture is seen within the more proximal right tibia or fibula. There is mild cortical lucency/thinning of the anterior tibial diaphyseal cortex at mid height. Mild-to-moderate medial compartment of the knee joint space narrowing. There is moderate scattered subcutaneous air about the posteromedial greater than the lateral aspect of the partially visualized distal right thigh. Note is made of extensive subcutaneous anterior abdominopelvic wall emphysema seen extending along the anterior pelvis and bilateral proximal thighs on 05/23/2022 CT, presumably extending down the right thigh to  the right knee on the current radiographs. IMPRESSION: Compared to 11/23/2013 1. Interval healing of previously seen distal fibular metadiaphyseal fracture. Minimal persistent fracture line lucency is seen in the region of the prior 2015 acute fracture, presumably now the patient's chronic baseline. 2. There is lucency with possible mild "hair on end" periosteal reaction within the anterolateral aspect of the right tibial diaphysis that is nonspecific. This may be secondary to chronic stress related changes or infectious/inflammatory process. This may be subacute to chronic posttraumatic change. It is difficult to exclude an infectious or malignant etiology. CT or MRI of the right tibia may be helpful for further evaluation. Electronically Signed   By: Yvonne Kendall M.D.   On: 05/24/2022 13:47   CT ABDOMEN PELVIS W CONTRAST  Result Date: 05/23/2022 CLINICAL DATA:  Postoperative abdominal pain. Radical cystectomy and ileal conduit performed on 05/09/2022. Urothelial carcinoma of the bladder. EXAM: CT ABDOMEN AND PELVIS WITH CONTRAST TECHNIQUE: Multidetector CT imaging of the abdomen and pelvis was performed using the standard protocol following bolus administration of intravenous contrast. RADIATION DOSE REDUCTION: This exam was performed according to the departmental dose-optimization program which includes automated exposure control, adjustment of the mA and/or kV according to patient size and/or use of iterative reconstruction technique. CONTRAST:  82m OMNIPAQUE IOHEXOL 300 MG/ML  SOLN COMPARISON:  CT abdomen and pelvis 04/29/2022. CT chest abdomen and pelvis 03/01/2022. FINDINGS: Lower chest: Chest wall emphysema present compatible with recent surgery. There is a new 5 mm left lower lobe nodule. Hepatobiliary: No focal liver abnormality is seen.  No gallstones, gallbladder wall thickening, or biliary dilatation. Pancreas: Unremarkable. No pancreatic ductal dilatation or surrounding inflammatory changes.  Spleen: Normal in size without focal abnormality. Adrenals/Urinary Tract: Patient is status post cystectomy with new right-sided abdominal ileal conduit/urinary diversion. There is mild bilateral hydronephrosis without obstructing calculus. Bilateral ureteral stents are present. There is no perinephric fluid collection or stranding. There is normal renal enhancement bilaterally. The adrenal glands are within normal limits. Stomach/Bowel: Appendix is not seen. No evidence of bowel wall thickening, distention, or inflammatory changes. There is a moderate-sized hiatal hernia. Stomach is nondilated. There is sigmoid colon diverticulosis. Vascular/Lymphatic: Aortic atherosclerosis. No enlarged abdominal or pelvic lymph nodes. Reproductive: Status post hysterectomy. No adnexal masses. Other: There is extensive subcutaneous abdominal and pelvic wall emphysema compatible with recent surgery. There is no abdominal wall hernia. No focal fluid collections are identified. Musculoskeletal: Degenerative changes affect the spine. IMPRESSION: 1. Patient is status post cystectomy with right-sided ileal conduit/urinary diversion. There is mild bilateral hydronephrosis without obstructing calculus. Bilateral ureteral stents are in place. 2. Extensive subcutaneous emphysema compatible with recent surgery. 3. Moderate-sized hiatal hernia. 4. Aortic atherosclerosis. 5. There is a new 5 mm left lower lobe nodule. Metastatic disease not excluded. Aortic Atherosclerosis (ICD10-I70.0). Electronically Signed   By: Ronney Asters M.D.   On: 05/23/2022 21:20   DG Chest 2 View  Result Date: 05/23/2022 CLINICAL DATA:  Suspected sepsis. Weakness. Patient had robotic assisted laparoscopic cystectomy with ileal conduit and pelvic lymph node dissection on 05/09/2022. EXAM: CHEST - 2 VIEW COMPARISON:  03/01/2022 FINDINGS: Patient has RIGHT-sided PowerPort, tip overlying the superior vena cava. Heart size is normal. The lungs are free of focal  consolidations and pleural effusions. There is no pulmonary edema. There is subcutaneous emphysema in the soft tissues of the UPPER abdomen, likely related to recent laparoscopic procedure. IMPRESSION: 1. No evidence for acute cardiopulmonary abnormality. 2. Subcutaneous emphysema in the UPPER abdomen, likely related to recent laparoscopic procedure. Consider CT of the abdomen and pelvis as needed. Electronically Signed   By: Nolon Nations M.D.   On: 05/23/2022 16:13    Anti-infectives: Anti-infectives (From admission, onward)    Start     Dose/Rate Route Frequency Ordered Stop   05/23/22 2000  ceFEPIme (MAXIPIME) 2 g in sodium chloride 0.9 % 100 mL IVPB        2 g 200 mL/hr over 30 Minutes Intravenous Every 12 hours 05/23/22 1935     05/23/22 1930  ceFEPIme (MAXIPIME) 2 g in sodium chloride 0.9 % 100 mL IVPB  Status:  Discontinued        2 g 200 mL/hr over 30 Minutes Intravenous  Once 05/23/22 1923 05/23/22 1935   05/23/22 1615  cefTRIAXone (ROCEPHIN) 2 g in sodium chloride 0.9 % 100 mL IVPB        2 g 200 mL/hr over 30 Minutes Intravenous  Once 05/23/22 1614 05/23/22 1740       Assessment/Plan:  1 - Oligometastatic Bladder Cancer - no further cancer related intervention this admission. Favor monitoring alone with consideration of systemic therapy for gross disease.   2 - Post-OP Failure to Thrive, Dehydration, Rule Out Infection - No surgical indications. Agree with hydration, empiric early pyelo treatment pending further CX data. She is certainly not yet back to baseline.   Greatly appreciate hospitalist team comanagment.    [name] 05/24/2022

## 2022-05-24 NOTE — [place] FL2 (Signed)
[place] MEDICAID FL2 LEVEL OF CARE FORM     IDENTIFICATION  Patient Name: Jalyric Kaestner Birthdate: [birth year] Sex: female Admission Date (Current Location): 05/23/2022  [facility] and [place] Number:  Herbalist and Address:  [facility],  [place], [place]      Provider Number: [id number]  Attending Physician Name and Address:  Mariel Aloe, MD  Relative Name and Phone Number:       Current Level of Care: Hospital Recommended Level of Care: [place] Prior Approval Number:    Date Approved/Denied:   PASRR Number: [id number]  Discharge Plan: SNF    Current Diagnoses: Patient Active Problem List   Diagnosis Date Noted   Protein-calorie malnutrition, severe 05/24/2022   Sepsis due to urinary tract infection ([place]) 05/23/2022   Hypercalcemia 05/23/2022   Hyperkalemia 05/23/2022   Normocytic anemia 94/70/9628   Acute metabolic encephalopathy 36/62/9476   Malnutrition of moderate degree 05/11/2022   Surgery, elective 05/09/2022   Bladder cancer ([place]) 05/09/2022   Health education 04/30/2022   Encounter for ostomy care education 04/11/2022   Attention to urostomy [facility]) 04/11/2022   Port-A-Cath in place 11/16/2021   Malignant neoplasm of urinary bladder ([place]) 11/03/2021   Bladder tumor 10/09/2021   Fracture of distal fibula 10/19/2013    Orientation RESPIRATION BLADDER Height & Weight     Self, Time, Situation, Place  Normal Continent Weight: 63 kg Height:  '5\' 3"'$  (160 cm)  BEHAVIORAL SYMPTOMS/MOOD NEUROLOGICAL BOWEL NUTRITION STATUS      Continent Diet (regular)  AMBULATORY STATUS COMMUNICATION OF NEEDS Skin   Extensive Assist Verbally Surgical wounds (closed abd wound)                       Personal Care Assistance Level of Assistance  Bathing, Feeding, Dressing Bathing Assistance: Limited assistance Feeding assistance: Limited assistance       Functional Limitations Info  Sight, Hearing,  Speech Sight Info: Adequate Hearing Info: Adequate Speech Info: Adequate    SPECIAL CARE FACTORS FREQUENCY  PT (By licensed PT), OT (By licensed OT)     PT Frequency: 5 x weekly OT Frequency: 5 x weekly            Contractures Contractures Info: Not present    Additional Factors Info  Code Status Code Status Info: FULL Allergies Info: NKA           Current Medications (05/24/2022):  This is the current hospital active medication list Current Facility-Administered Medications  Medication Dose Route Frequency Provider Last Rate Last Admin   0.9 %  sodium chloride infusion   Intravenous Continuous Mariel Aloe, MD 125 mL/hr at 05/24/22 1131 Restarted at 05/24/22 1131   acetaminophen (TYLENOL) tablet 650 mg  650 mg Oral Q6H PRN Lenore Cordia, MD       Or   acetaminophen (TYLENOL) suppository 650 mg  650 mg Rectal Q6H PRN Lenore Cordia, MD       ceFEPIme (MAXIPIME) 2 g in sodium chloride 0.9 % 100 mL IVPB  2 g Intravenous Q12H Eudelia Bunch, RPH   Stopped at 05/24/22 5465   Chlorhexidine Gluconate Cloth 2 % PADS 6 each  6 each Topical Daily Mariel Aloe, MD   6 each at 05/24/22 0929   heparin injection 5,000 Units  5,000 Units Subcutaneous Q8H Zada Finders R, MD   5,000 Units at 05/24/22 1319   ondansetron (ZOFRAN) tablet 4 mg  4 mg Oral Q6H PRN Lenore Cordia, MD       Or   ondansetron (ZOFRAN) injection 4 mg  4 mg Intravenous Q6H PRN Lenore Cordia, MD       senna-docusate (Senokot-S) tablet 1 tablet  1 tablet Oral QHS PRN Zada Finders R, MD       sodium chloride flush (NS) 0.9 % injection 3 mL  3 mL Intravenous Q12H Lenore Cordia, MD   3 mL at 05/24/22 0930     Discharge Medications: Please see discharge summary for a list of discharge medications.  Relevant Imaging Results:  Relevant Lab Results:   Additional Information [phone]  Leeroy Cha, RN

## 2022-05-24 NOTE — Progress Notes (Signed)
Initial Nutrition Assessment  DOCUMENTATION CODES:   Severe malnutrition in context of chronic illness  INTERVENTION:   Once diet advanced: -Prosource Plus PO TID, each provides 100 kcals and 15g protein  -Magic cup TID with meals, each supplement provides 290 kcal and 9 grams of protein  -Multivitamin with minerals daily  NUTRITION DIAGNOSIS:   Severe Malnutrition related to chronic illness, cancer and cancer related treatments as evidenced by mild fat depletion, severe muscle depletion, percent weight loss.  GOAL:   Patient will meet greater than or equal to 90% of their needs  MONITOR:   Diet advancement, Weight trends, Labs, I & O's  REASON FOR ASSESSMENT:   Malnutrition Screening Tool    ASSESSMENT:   89 y.o. female with medical history significant for bladder cancer s/p robotic cystectomy and ileal conduit diversion 05/09/2022, anemia who presented to the ED from SNF for evaluation of poor oral intake and weakness.  Patient in room, no family present. Pt unable to provide any history. Alert/oriented x 1. Pt was previously seen by RD in recent admission. At that time, family had reported they wanted to provide nutrition supplements and were not interested in Ensure supplements for patient. Unable to determine if pt was taking supplements PTA at this time. Pt has had poor PO intakes for at least 3 days PTA. Currently NPO, with encephalopathy.   Per weight records, pt has lost 10 lbs since 03/07/22 (6% wt loss x 2.5 months, significant for time frame).  Medications reviewed.  Labs reviewed.  NUTRITION - FOCUSED PHYSICAL EXAM:  Flowsheet Row Most Recent Value  Orbital Region Mild depletion  Upper Arm Region Moderate depletion  Thoracic and Lumbar Region Mild depletion  Buccal Region Mild depletion  Temple Region Moderate depletion  Clavicle Bone Region Severe depletion  Clavicle and Acromion Bone Region Severe depletion  Scapular Bone Region Unable to assess   Dorsal Hand Moderate depletion  Patellar Region Mild depletion  Anterior Thigh Region Mild depletion  Posterior Calf Region Mild depletion  Edema (RD Assessment) None  Hair Reviewed  Eyes Reviewed  [unable to keep open]  Mouth Reviewed  Skin Reviewed  Nails Reviewed       Diet Order:   Diet Order             Diet NPO time specified Except for: Sips with Meds  Diet effective now                   EDUCATION NEEDS:   Not appropriate for education at this time  Skin:  Skin Assessment: Reviewed RN Assessment  Last BM:  PTA  Height:   Ht Readings from Last 1 Encounters:  05/23/22 '5\' 3"'$  (1.6 m)    Weight:   Wt Readings from Last 1 Encounters:  05/23/22 63 kg    BMI:  Body mass index is 24.6 kg/m.  Estimated Nutritional Needs:   Kcal:  1800-2000  Protein:  85-95g  Fluid:  2L/day  Clayton Bibles, MS, RD, LDN Inpatient Clinical Dietitian Contact information available via Amion

## 2022-05-24 NOTE — Progress Notes (Signed)
Rapid Response Event Note   Reason for Call :  pt status change   Initial Focused Assessment:  Tachypnea @ 26 Tachycardia @ 132 Temp 100.8 Unable to follow commands, agitation to oral suction PEERLA, purposeful movement all extremities   Interventions:  Tylenol suppository given see MAR  Plan of Care:  Transfer to Stepdown UNit   MD Notified: previously notified by bedside RN  Justice Britain, RN

## 2022-05-24 NOTE — Progress Notes (Signed)
   05/24/22 1529  Assess: MEWS Score  Temp (!) 97.5 F (36.4 C)  BP 124/71  MAP (mmHg) 86  Pulse Rate (!) 136  ECG Heart Rate (!) 137  Resp (!) 22  SpO2 95 %  O2 Device Room Air  Assess: MEWS Score  MEWS Temp 0  MEWS Systolic 0  MEWS Pulse 3  MEWS RR 1  MEWS LOC 0  MEWS Score 4  MEWS Score Color Red  Assess: if the MEWS score is Yellow or Red  Were vital signs taken at a resting state? Yes  Focused Assessment No change from prior assessment  Does the patient meet 2 or more of the SIRS criteria? Yes  Does the patient have a confirmed or suspected source of infection? Yes  Provider and Rapid Response Notified? Yes  MEWS guidelines implemented *See Row Information* Yes  Treat  MEWS Interventions Other (Comment) (MD notified, orders received and implemented)  Pain Scale PAINAD  Pain Score 0  Breathing 0  Negative Vocalization 0  Facial Expression 0  Body Language 0  Consolability 0  PAINAD Score 0  Escalate  MEWS: Escalate Red: discuss with charge nurse/RN and provider, consider discussing with RRT  Notify: Charge Nurse/RN  Name of Charge Nurse/RN Notified Janett Billow, RN  Date Charge Nurse/RN Notified 05/24/22  Time Charge Nurse/RN Notified 1530  Provider Notification  Provider Name/Title Dr Lonny Prude  Date Provider Notified 05/24/22  Time Provider Notified 1535  Method of Notification Page  Notification Reason Other (Comment) (Red MEWS)  Provider response See new orders  Date of Provider Response 05/24/22  Time of Provider Response 1620  Assess: SIRS CRITERIA  SIRS Temperature  0  SIRS Pulse 1  SIRS Respirations  1  SIRS WBC 0  SIRS Score Sum  2

## 2022-05-24 NOTE — TOC Initial Note (Signed)
Transition of Care [facility]) - Initial/Assessment Note    Patient Details  Name: Brandy Wheeler MRN: [id number] Date of Birth: October 17, 1931  [facility] CM/SW Contact:    Leeroy Cha, RN Phone Number: 05/24/2022, 8:35 AM  Clinical Narrative:                 Patient is from [facility].  Increase lethgary and decreased intake.  Recent history of renal/bladder surgery.  Will follow to return to snf.  Expected Discharge Plan: Skilled Nursing Facility Barriers to Discharge: Continued Medical Work up   Patient Goals and CMS Choice Patient states their goals for this hospitalization and ongoing recovery are:: unable to state at this time -from [employer] Medicare.gov Compare Post Acute Care list provided to:: Patient Represenative (must comment) (daughter) Choice offered to / list presented to : Adult Children      Expected Discharge Plan and Services   Discharge Planning Services: CM Consult   Living arrangements for the past 2 months: [place]                                      Prior Living Arrangements/Services Living arrangements for the past 2 months: [place] Lives with:: Facility Resident Patient language and need for interpreter reviewed:: Yes Do you feel safe going back to the place where you live?: Yes            Criminal Activity/Legal Involvement Pertinent to Current Situation/Hospitalization: No - Comment as needed  Activities of Daily Living      Permission Sought/Granted                  Emotional Assessment Appearance:: Appears stated age Attitude/Demeanor/Rapport: Unable to Assess Affect (typically observed): Unable to Assess Orientation: : Fluctuating Orientation (Suspected and/or reported Sundowners) Alcohol / Substance Use: Alcohol Use (current occassional per the daughter) Psych Involvement: No (comment)  Admission diagnosis:  Acute cystitis with hematuria [N30.01] AKI (acute  kidney injury) ([place]) [N17.9] Sepsis due to urinary tract infection ([place]) [A41.9, N39.0] Patient Active Problem List   Diagnosis Date Noted   Sepsis due to urinary tract infection ([place]) 05/23/2022   Hypercalcemia 05/23/2022   Hyperkalemia 05/23/2022   Normocytic anemia 69/48/5462   Acute metabolic encephalopathy 70/35/0093   Malnutrition of moderate degree 05/11/2022   Surgery, elective 05/09/2022   Bladder cancer ([place]) 05/09/2022   Health education 04/30/2022   Encounter for ostomy care education 04/11/2022   Attention to urostomy [facility]) 04/11/2022   Port-A-Cath in place 11/16/2021   Malignant neoplasm of urinary bladder ([place]) 11/03/2021   Bladder tumor 10/09/2021   Fracture of distal fibula 10/19/2013   PCP:  Pcp, No Pharmacy:  No Pharmacies Listed    Social Determinants of Health (SDOH) Social History: SDOH Screenings   Food Insecurity: No Food Insecurity (05/08/2022)  Housing: Low Risk  (05/08/2022)  Transportation Needs: No Transportation Needs (05/08/2022)  Utilities: Not At Risk (05/08/2022)  Tobacco Use: Low Risk  (05/10/2022)   SDOH Interventions:     Readmission Risk Interventions   No data to display

## 2022-05-24 NOTE — TOC Progression Note (Signed)
Transition of Care [facility]) - Progression Note    Patient Details  Name: Suni Jarnagin MRN: [id number] Date of Birth: [birth year]  Transition of Care [facility]) CM/SW Contact  Leeroy Cha, RN Phone Number: 05/24/2022, 1:31 PM  Clinical Narrative:    Patient is from [facility].  Fl2 updated and sent to [facility] for review.   Expected Discharge Plan: [place] Barriers to Discharge: Continued Medical Work up  Expected Discharge Plan and Services   Discharge Planning Services: CM Consult   Living arrangements for the past 2 months: [place]                                       Social Determinants of Health (SDOH) Interventions SDOH Screenings   Food Insecurity: No Food Insecurity (05/08/2022)  Housing: Low Risk  (05/08/2022)  Transportation Needs: No Transportation Needs (05/08/2022)  Utilities: Not At Risk (05/08/2022)  Tobacco Use: Low Risk  (05/10/2022)    Readmission Risk Interventions   No data to display

## 2022-05-24 NOTE — Progress Notes (Signed)
PT Cancellation Note  Patient Details Name: Brandy Wheeler MRN: [id number] DOB: 02/03/1932   Cancelled Treatment:    Reason Eval/Treat Not Completed: Patient's level of consciousness (pt sleeping very soundly, did not arouse to verbal stimulii, grimmaced to sternal rub but did not open eyes. Will attempt again tomorrow.)   [name] PT 05/24/2022  Acute Rehabilitation Services  Office [phone]

## 2022-05-25 ENCOUNTER — Inpatient Hospital Stay (HOSPITAL_COMMUNITY): Payer: Medicare HMO

## 2022-05-25 DIAGNOSIS — A419 Sepsis, unspecified organism: Secondary | ICD-10-CM | POA: Diagnosis not present

## 2022-05-25 DIAGNOSIS — E875 Hyperkalemia: Secondary | ICD-10-CM | POA: Diagnosis not present

## 2022-05-25 DIAGNOSIS — G9341 Metabolic encephalopathy: Secondary | ICD-10-CM | POA: Diagnosis not present

## 2022-05-25 LAB — RESPIRATORY PANEL BY PCR

## 2022-05-25 LAB — BASIC METABOLIC PANEL
Anion gap: 4 — ABNORMAL LOW (ref 5–15)
BUN: 50 mg/dL — ABNORMAL HIGH (ref 8–23)
CO2: 23 mmol/L (ref 22–32)
Calcium: 11.7 mg/dL — ABNORMAL HIGH (ref 8.9–10.3)
Chloride: 117 mmol/L — ABNORMAL HIGH (ref 98–111)
Creatinine, Ser: 1.09 mg/dL — ABNORMAL HIGH (ref 0.44–1.00)
GFR, Estimated: 49 mL/min — ABNORMAL LOW (ref >60)
Glucose, Bld: 128 mg/dL — ABNORMAL HIGH (ref 70–99)
Potassium: 4.4 mmol/L (ref 3.5–5.1)
Sodium: 144 mmol/L (ref 135–145)

## 2022-05-25 LAB — CBC
HCT: 25.9 % — ABNORMAL LOW (ref 36.0–46.0)
Hemoglobin: 7.8 g/dL — ABNORMAL LOW (ref 12.0–15.0)
MCH: 27.9 pg (ref 26.0–34.0)
MCHC: 30.1 g/dL (ref 30.0–36.0)
MCV: 92.5 fL (ref 80.0–100.0)
Platelets: 316 10*3/uL (ref 150–400)
RBC: 2.8 MIL/uL — ABNORMAL LOW (ref 3.87–5.11)
RDW: 14.7 % (ref 11.5–15.5)
WBC: 10.1 10*3/uL (ref 4.0–10.5)
nRBC: 0 % (ref 0.0–0.2)

## 2022-05-25 LAB — GLUCOSE, CAPILLARY
Glucose-Capillary: 100 mg/dL — ABNORMAL HIGH (ref 70–99)
Glucose-Capillary: 111 mg/dL — ABNORMAL HIGH (ref 70–99)
Glucose-Capillary: 113 mg/dL — ABNORMAL HIGH (ref 70–99)
Glucose-Capillary: 134 mg/dL — ABNORMAL HIGH (ref 70–99)
Glucose-Capillary: 95 mg/dL (ref 70–99)
Glucose-Capillary: 99 mg/dL (ref 70–99)

## 2022-05-25 LAB — LACTIC ACID, PLASMA: Lactic Acid, Venous: 0.9 mmol/L (ref 0.5–1.9)

## 2022-05-25 LAB — ALBUMIN: Albumin: 1.9 g/dL — ABNORMAL LOW (ref 3.5–5.0)

## 2022-05-25 LAB — MISC LABCORP TEST (SEND OUT): Labcorp test code: 81950

## 2022-05-25 LAB — CALCITRIOL (1,25 DI-OH VIT D): Vit D, 1,25-Dihydroxy: 44.3 pg/mL (ref 24.8–81.5)

## 2022-05-25 LAB — CALCIUM: Calcium: 10.4 mg/dL — ABNORMAL HIGH (ref 8.9–10.3)

## 2022-05-25 MED ORDER — CALCITONIN (SALMON) 200 UNIT/ML IJ SOLN
4.0000 [IU]/kg | Freq: Two times a day (BID) | INTRAMUSCULAR | Status: DC
  Administered 2022-05-25 (×2): 252 [IU] via INTRAMUSCULAR
  Filled 2022-05-25 (×3): qty 1.26

## 2022-05-25 MED ORDER — ZOLEDRONIC ACID 4 MG/100ML IV SOLN
4.0000 mg | Freq: Once | INTRAVENOUS | Status: AC
  Administered 2022-05-25: 4 mg via INTRAVENOUS
  Filled 2022-05-25: qty 100

## 2022-05-25 NOTE — Progress Notes (Addendum)
Rn messaged at [phone] about patients CT scan. Message seen by RN, no response.

## 2022-05-25 NOTE — Progress Notes (Addendum)
PROGRESS NOTE    Brandy Wheeler  FOY:[id number] DOB: 11-Jun-1931 DOA: 05/23/2022 PCP: Pcp, No   Brief Narrative: Brandy Wheeler is a 89 y.o. female with a history of bladder cancer s/p robotic cystectomy and ileal conduit diversion, GERD and anemia. Patient presented secondary to altered mental status and found to have evidence of sepsis secondary to UTI. Empiric antibiotics started. Hospitalization complicated by persistent altered mental status and severe hypercalcemia.   Assessment and Plan:  Acute metabolic encephalopathy Present on admission. Likely secondary to UTI but with hypercalcemia, likely contributor as well. CT head unremarkable for acute process. -NPO  Sepsis Present on admission. Secondary to UTI. Blood and urine cultures obtained on admission and are pending. Empiric antibiotics started. -Follow-up culture data  UTI Urinalysis suggests UTI. Complicated by recent urologic procedure. Patient started empirically on Cefepime -Continue Cefepime for now -Follow-up urine culture data  Hypercalcemia Unclear etiology, but possibly secondary to dehydration but could be related to underlying cancer. Patient is also on vitamin D supplementation. Calcium of 12.3 on admission. Corrected calcium of 13.4 today. Bisphosphonate contraindicated secondary to creatinine clearance. 24-hydroxy vitamin D level of 91.4 (wnl). -Continue IV fluids -Zometa; Calcitonin 4 units/kg BID x4 -Repeat BMP in AM -Follow-up 1,25-hydroxy vitamin D level and PTH  Elevated creatinine Mild elevation with associated elevated BUN of 50 today. Patient wit mild hydronephrosis on admission. Good urine output noted. -Strict in/out -Continue IV fluids -If continued worsening, repeat imaging (renal ultrasound)  Hyperkalemia Mild. Resolved.  GERD -Hold PPI while NPO  Anemia Appears to be post-operative. Hemoglobin of 9.7 on admission with decrease to 7.8 today. No obvious evidence of bleeding. Likely related  to dilution effect. -Trend hemoglobin -Transfuse for hemoglobin <7  Left tib/fib fracture Patient follows with orthopedic surgery and has a casted left leg. She is non-weight bearing of that leg -PT/OT eval pending improvement of mental status  Right ankle/leg pain Unclear etiology. X-ray without definitive diagnosis. -CT tib/fib  Oligometastatic bladder cancer Patient follows with oncology and urology. S/p robotic cystectomy with node dissection and conduit diversion.  Severe malnutrition Dietitian recommendations: Prosource Plus PO TID, each provides 100 kcals and 15g protein  Magic cup TID with meals, each supplement provides 290 kcal and 9 grams of protein  Multivitamin with minerals daily  DVT prophylaxis: Heparin subq Code Status:   Code Status: Full Code Family Communication: None at bedside. Daughter via telephone Disposition Plan: Discharge back to SNF likely in 3+ days pending improvement of mental status, improvement of hypercalcemia, transition to oral antibiotics if able   Consultants:  Urology  Procedures:  None  Antimicrobials: Cefepime  Ceftriaxone   Subjective: Patient unable to give history today secondary to mental status  Objective: BP (!) 133/47   Pulse (!) 106   Temp 98.1 F (36.7 C) (Axillary)   Resp 17   Ht '5\' 3"'$  (1.6 m)   Wt 63 kg   SpO2 98%   BMI 24.60 kg/m   Examination:  General exam: Appears calm and comfortable Respiratory system: Clear to auscultation. Respiratory effort normal. Cardiovascular system: S1 & S2 heard, RRR. No murmurs, rubs, gallops or clicks. Gastrointestinal system: Abdomen is nondistended, soft and nontender. Normal bowel sounds heard. Central nervous system: Alert but completely disoriented. Follows simple commands when asked to reach to squeeze my fingers Musculoskeletal: No edema. Right anterior tibial pain with overlying mass   Data Reviewed: I have personally reviewed following labs and imaging  studies  CBC Lab Results  Component Value Date  WBC 10.1 05/25/2022   RBC 2.80 (L) 05/25/2022   HGB 7.8 (L) 05/25/2022   HCT 25.9 (L) 05/25/2022   MCV 92.5 05/25/2022   MCH 27.9 05/25/2022   PLT 316 05/25/2022   MCHC 30.1 05/25/2022   RDW 14.7 05/25/2022   LYMPHSABS 2.6 05/23/2022   MONOABS 1.1 (H) 05/23/2022   EOSABS 0.3 05/23/2022   BASOSABS 0.1 42/70/6237     Last metabolic panel Lab Results  Component Value Date   NA 144 05/25/2022   K 4.4 05/25/2022   CL 117 (H) 05/25/2022   CO2 23 05/25/2022   BUN 50 (H) 05/25/2022   CREATININE 1.09 (H) 05/25/2022   GLUCOSE 128 (H) 05/25/2022   GFRNONAA 49 (L) 05/25/2022   CALCIUM 11.7 (H) 05/25/2022   PROT 5.6 (L) 05/24/2022   ALBUMIN 1.9 (L) 05/25/2022   BILITOT 0.6 05/24/2022   ALKPHOS 159 (H) 05/24/2022   AST 21 05/24/2022   ALT 10 05/24/2022   ANIONGAP 4 (L) 05/25/2022    GFR: Estimated Creatinine Clearance: 31.9 mL/min (A) (by C-G formula based on SCr of 1.09 mg/dL (H)).  Recent Results (from the past 240 hour(s))  Urine Culture (for pregnant, neutropenic or urologic patients or patients with an indwelling urinary catheter)     Status: None (Preliminary result)   Collection Time: 05/23/22  3:50 PM   Specimen: Urine, Clean Catch  Result Value Ref Range Status   Specimen Description   Final    URINE, CLEAN CATCH Performed at [facility] [address], [place], [place] [address]    Special Requests   Final    NONE Performed at [facility], [place] [address], [place], [place] [address]    Culture   Final    CULTURE REINCUBATED FOR BETTER GROWTH Performed at [facility], [place] [address], [place], [place] [address]    Report Status PENDING  Incomplete  Culture, blood (Routine X 2) w Reflex to ID Panel     Status: None (Preliminary result)   Collection Time: 05/23/22  4:00 PM   Specimen: BLOOD  Result Value Ref Range Status   Specimen Description   Final    BLOOD  PORTA CATH Performed at [facility] [address], [place], [place] [address]    Special Requests   Final    BOTTLES DRAWN AEROBIC AND ANAEROBIC Blood Culture results may not be optimal due to an inadequate volume of blood received in culture bottles Performed at [facility] [address] [place], [place] [address]    Culture   Final    NO GROWTH 2 DAYS Performed at [facility] [address], [place], [place] [address]    Report Status PENDING  Incomplete  Resp panel by RT-PCR (RSV, Flu A&B, Covid) Anterior Nasal Swab     Status: None   Collection Time: 05/23/22  4:02 PM   Specimen: Anterior Nasal Swab  Result Value Ref Range Status   SARS Coronavirus 2 by RT PCR NEGATIVE NEGATIVE Final    Comment: (NOTE) SARS-CoV-2 target nucleic acids are NOT DETECTED.  The SARS-CoV-2 RNA is generally detectable in upper respiratory specimens during the acute phase of infection. The lowest concentration of SARS-CoV-2 viral copies this assay can detect is 138 copies/mL. A negative result does not preclude SARS-Cov-2 infection and should not be used as the sole basis for treatment or other patient management decisions. A negative result may occur with  improper specimen  collection/handling, submission of specimen other than nasopharyngeal swab, presence of viral mutation(s) within the areas targeted by this assay, and inadequate number of viral copies(<138 copies/mL). A negative result must be combined with clinical observations, patient history, and epidemiological information. The expected result is Negative.  Fact Sheet for Patients:  EntrepreneurPulse.com.au  Fact Sheet for Healthcare Providers:  IncredibleEmployment.be  This test is no t yet approved or cleared by the [place] FDA and  has been authorized for detection and/or diagnosis of SARS-CoV-2 by FDA under an Emergency Use Authorization  (EUA). This EUA will remain  in effect (meaning this test can be used) for the duration of the COVID-19 declaration under Section 564(b)(1) of the Act, 21 U.S.C.section 360bbb-3(b)(1), unless the authorization is terminated  or revoked sooner.       Influenza A by PCR NEGATIVE NEGATIVE Final   Influenza B by PCR NEGATIVE NEGATIVE Final    Comment: (NOTE) The Xpert Xpress SARS-CoV-2/FLU/RSV plus assay is intended as an aid in the diagnosis of influenza from Nasopharyngeal swab specimens and should not be used as a sole basis for treatment. Nasal washings and aspirates are unacceptable for Xpert Xpress SARS-CoV-2/FLU/RSV testing.  Fact Sheet for Patients: EntrepreneurPulse.com.au  Fact Sheet for Healthcare Providers: IncredibleEmployment.be  This test is not yet approved or cleared by the [place] FDA and has been authorized for detection and/or diagnosis of SARS-CoV-2 by FDA under an Emergency Use Authorization (EUA). This EUA will remain in effect (meaning this test can be used) for the duration of the COVID-19 declaration under Section 564(b)(1) of the Act, 21 U.S.C. section 360bbb-3(b)(1), unless the authorization is terminated or revoked.     Resp Syncytial Virus by PCR NEGATIVE NEGATIVE Final    Comment: (NOTE) Fact Sheet for Patients: EntrepreneurPulse.com.au  Fact Sheet for Healthcare Providers: IncredibleEmployment.be  This test is not yet approved or cleared by the [place] FDA and has been authorized for detection and/or diagnosis of SARS-CoV-2 by FDA under an Emergency Use Authorization (EUA). This EUA will remain in effect (meaning this test can be used) for the duration of the COVID-19 declaration under Section 564(b)(1) of the Act, 21 U.S.C. section 360bbb-3(b)(1), unless the authorization is terminated or revoked.  Performed at [facility], [place] [address], [place], [place] [address]   Culture, blood (Routine X 2) w Reflex to ID Panel     Status: None (Preliminary result)   Collection Time: 05/23/22  9:47 PM   Specimen: BLOOD  Result Value Ref Range Status   Specimen Description   Final    BLOOD BLOOD RIGHT HAND Performed at [facility] [address], [place], [place] [address]    Special Requests   Final    BOTTLES DRAWN AEROBIC ONLY Blood Culture results may not be optimal due to an inadequate volume of blood received in culture bottles Performed at [facility] [address], [place], [place] [address]    Culture   Final    NO GROWTH 2 DAYS Performed at [facility] [address] [place], [place] [address]    Report Status PENDING  Incomplete  Respiratory (~20 pathogens) panel by PCR     Status: None   Collection Time: 05/24/22  6:39 PM   Specimen: Nasopharyngeal Swab; Respiratory  Result Value Ref Range Status   Adenovirus NOT DETECTED NOT DETECTED Final   Coronavirus 229E NOT DETECTED NOT DETECTED Final    Comment: (NOTE) The Coronavirus on the  Respiratory Panel, DOES NOT test for the novel  Coronavirus (2019 nCoV)    Coronavirus HKU1 NOT DETECTED NOT DETECTED Final   Coronavirus NL63 NOT DETECTED NOT DETECTED Final   Coronavirus OC43 NOT DETECTED NOT DETECTED Final   Metapneumovirus NOT DETECTED NOT DETECTED Final   Rhinovirus / Enterovirus NOT DETECTED NOT DETECTED Final   Influenza A NOT DETECTED NOT DETECTED Final   Influenza B NOT DETECTED NOT DETECTED Final   Parainfluenza Virus 1 NOT DETECTED NOT DETECTED Final   Parainfluenza Virus 2 NOT DETECTED NOT DETECTED Final   Parainfluenza Virus 3 NOT DETECTED NOT DETECTED Final   Parainfluenza Virus 4 NOT DETECTED NOT DETECTED Final   Respiratory Syncytial Virus NOT DETECTED NOT DETECTED Final   Bordetella pertussis NOT DETECTED NOT DETECTED Final   Bordetella Parapertussis NOT DETECTED NOT DETECTED Final    Chlamydophila pneumoniae NOT DETECTED NOT DETECTED Final   Mycoplasma pneumoniae NOT DETECTED NOT DETECTED Final    Comment: Performed at [facility], [place] [address] [place], [place] [address]      Radiology Studies: DG CHEST PORT 1 VIEW  Result Date: 05/24/2022 CLINICAL DATA:  Tachypnea, history of bladder carcinoma EXAM: PORTABLE CHEST 1 VIEW COMPARISON:  05/23/2022 FINDINGS: Cardiac shadow is stable. Right chest wall port is noted in satisfactory position. Aortic calcifications are seen. Lungs are well aerated bilaterally. No focal infiltrate or sizable effusion is noted. Persistent subcutaneous emphysema is noted related to prior laparoscopy IMPRESSION: No acute abnormality noted. Electronically Signed   By: Inez Catalina M.D.   On: 05/24/2022 17:13   DG Tibia/Fibula Right  Result Date: 05/24/2022 CLINICAL DATA:  Fall.  Right leg pain.  Right ankle pain. EXAM: RIGHT TIBIA AND FIBULA - 2 VIEW; RIGHT ANKLE - COMPLETE 3+ VIEW COMPARISON:  Right ankle radiographs 11/23/2013, 10/27/2013, and 10/10/2013; CT abdomen and pelvis 05/23/2022 FINDINGS: Right ankle: Interval healing of the previously seen oblique distal fibular metadiaphyseal fracture. There is increased periosteal thickening of the anterior aspect of the distal fibular diaphysis, presumably from healing of that fracture. Minimal persistent fracture line lucency is seen in the region of the prior 2015 acute fracture, presumably now the patient's chronic baseline. The ankle mortise is symmetric and intact. Small plantar calcaneal heel spur. No acute fracture is seen. Moderate talonavicular joint space narrowing. Right tibia and fibula: No acute fracture is seen within the more proximal right tibia or fibula. There is mild cortical lucency/thinning of the anterior tibial diaphyseal cortex at mid height. Mild-to-moderate medial compartment of the knee joint space narrowing. There is moderate scattered subcutaneous air about the posteromedial  greater than the lateral aspect of the partially visualized distal right thigh. Note is made of extensive subcutaneous anterior abdominopelvic wall emphysema seen extending along the anterior pelvis and bilateral proximal thighs on 05/23/2022 CT, presumably extending down the right thigh to the right knee on the current radiographs. IMPRESSION: Compared to 11/23/2013 1. Interval healing of previously seen distal fibular metadiaphyseal fracture. Minimal persistent fracture line lucency is seen in the region of the prior 2015 acute fracture, presumably now the patient's chronic baseline. 2. There is lucency with possible mild "hair on end" periosteal reaction within the anterolateral aspect of the right tibial diaphysis that is nonspecific. This may be secondary to chronic stress related changes or infectious/inflammatory process. This may be subacute to chronic posttraumatic change. It is difficult to exclude an infectious or malignant etiology. CT or MRI of the right tibia may be helpful for further evaluation.  Electronically Signed   By: Yvonne Kendall M.D.   On: 05/24/2022 13:47   DG Ankle Complete Right  Result Date: 05/24/2022 CLINICAL DATA:  Fall.  Right leg pain.  Right ankle pain. EXAM: RIGHT TIBIA AND FIBULA - 2 VIEW; RIGHT ANKLE - COMPLETE 3+ VIEW COMPARISON:  Right ankle radiographs 11/23/2013, 10/27/2013, and 10/10/2013; CT abdomen and pelvis 05/23/2022 FINDINGS: Right ankle: Interval healing of the previously seen oblique distal fibular metadiaphyseal fracture. There is increased periosteal thickening of the anterior aspect of the distal fibular diaphysis, presumably from healing of that fracture. Minimal persistent fracture line lucency is seen in the region of the prior 2015 acute fracture, presumably now the patient's chronic baseline. The ankle mortise is symmetric and intact. Small plantar calcaneal heel spur. No acute fracture is seen. Moderate talonavicular joint space narrowing. Right tibia and  fibula: No acute fracture is seen within the more proximal right tibia or fibula. There is mild cortical lucency/thinning of the anterior tibial diaphyseal cortex at mid height. Mild-to-moderate medial compartment of the knee joint space narrowing. There is moderate scattered subcutaneous air about the posteromedial greater than the lateral aspect of the partially visualized distal right thigh. Note is made of extensive subcutaneous anterior abdominopelvic wall emphysema seen extending along the anterior pelvis and bilateral proximal thighs on 05/23/2022 CT, presumably extending down the right thigh to the right knee on the current radiographs. IMPRESSION: Compared to 11/23/2013 1. Interval healing of previously seen distal fibular metadiaphyseal fracture. Minimal persistent fracture line lucency is seen in the region of the prior 2015 acute fracture, presumably now the patient's chronic baseline. 2. There is lucency with possible mild "hair on end" periosteal reaction within the anterolateral aspect of the right tibial diaphysis that is nonspecific. This may be secondary to chronic stress related changes or infectious/inflammatory process. This may be subacute to chronic posttraumatic change. It is difficult to exclude an infectious or malignant etiology. CT or MRI of the right tibia may be helpful for further evaluation. Electronically Signed   By: Yvonne Kendall M.D.   On: 05/24/2022 13:47   CT ABDOMEN PELVIS W CONTRAST  Result Date: 05/23/2022 CLINICAL DATA:  Postoperative abdominal pain. Radical cystectomy and ileal conduit performed on 05/09/2022. Urothelial carcinoma of the bladder. EXAM: CT ABDOMEN AND PELVIS WITH CONTRAST TECHNIQUE: Multidetector CT imaging of the abdomen and pelvis was performed using the standard protocol following bolus administration of intravenous contrast. RADIATION DOSE REDUCTION: This exam was performed according to the departmental dose-optimization program which includes  automated exposure control, adjustment of the mA and/or kV according to patient size and/or use of iterative reconstruction technique. CONTRAST:  11m OMNIPAQUE IOHEXOL 300 MG/ML  SOLN COMPARISON:  CT abdomen and pelvis 04/29/2022. CT chest abdomen and pelvis 03/01/2022. FINDINGS: Lower chest: Chest wall emphysema present compatible with recent surgery. There is a new 5 mm left lower lobe nodule. Hepatobiliary: No focal liver abnormality is seen. No gallstones, gallbladder wall thickening, or biliary dilatation. Pancreas: Unremarkable. No pancreatic ductal dilatation or surrounding inflammatory changes. Spleen: Normal in size without focal abnormality. Adrenals/Urinary Tract: Patient is status post cystectomy with new right-sided abdominal ileal conduit/urinary diversion. There is mild bilateral hydronephrosis without obstructing calculus. Bilateral ureteral stents are present. There is no perinephric fluid collection or stranding. There is normal renal enhancement bilaterally. The adrenal glands are within normal limits. Stomach/Bowel: Appendix is not seen. No evidence of bowel wall thickening, distention, or inflammatory changes. There is a moderate-sized hiatal hernia. Stomach is nondilated. There is  sigmoid colon diverticulosis. Vascular/Lymphatic: Aortic atherosclerosis. No enlarged abdominal or pelvic lymph nodes. Reproductive: Status post hysterectomy. No adnexal masses. Other: There is extensive subcutaneous abdominal and pelvic wall emphysema compatible with recent surgery. There is no abdominal wall hernia. No focal fluid collections are identified. Musculoskeletal: Degenerative changes affect the spine. IMPRESSION: 1. Patient is status post cystectomy with right-sided ileal conduit/urinary diversion. There is mild bilateral hydronephrosis without obstructing calculus. Bilateral ureteral stents are in place. 2. Extensive subcutaneous emphysema compatible with recent surgery. 3. Moderate-sized hiatal  hernia. 4. Aortic atherosclerosis. 5. There is a new 5 mm left lower lobe nodule. Metastatic disease not excluded. Aortic Atherosclerosis (ICD10-I70.0). Electronically Signed   By: Ronney Asters M.D.   On: 05/23/2022 21:20   DG Chest 2 View  Result Date: 05/23/2022 CLINICAL DATA:  Suspected sepsis. Weakness. Patient had robotic assisted laparoscopic cystectomy with ileal conduit and pelvic lymph node dissection on 05/09/2022. EXAM: CHEST - 2 VIEW COMPARISON:  03/01/2022 FINDINGS: Patient has RIGHT-sided PowerPort, tip overlying the superior vena cava. Heart size is normal. The lungs are free of focal consolidations and pleural effusions. There is no pulmonary edema. There is subcutaneous emphysema in the soft tissues of the UPPER abdomen, likely related to recent laparoscopic procedure. IMPRESSION: 1. No evidence for acute cardiopulmonary abnormality. 2. Subcutaneous emphysema in the UPPER abdomen, likely related to recent laparoscopic procedure. Consider CT of the abdomen and pelvis as needed. Electronically Signed   By: Nolon Nations M.D.   On: 05/23/2022 16:13      LOS: 2 days    Cordelia Poche, MD [facility] 05/25/2022, 11:26 AM   If 7PM-7AM, please contact night-coverage [email or web address]

## 2022-05-25 NOTE — Evaluation (Signed)
Occupational Therapy Evaluation Patient Details Name: Brandy Wheeler MRN: [id number] DOB: 11-27-1931 Today's Date: 05/25/2022   History of Present Illness 89 yr old female with PMH of bladder cancer s/p robotic cystectomy and ileal conduit diversion 05/09/2022, anemia, L tib/fib fx NWB (pt has current cast) who presented to the ED from SNF for evaluation due to poor oral intake and weakness. Dx of sepsis, UTI, acute encephalopathy    Clinical Impression   Patient is currently  presenting with significant deficits as she is limited by deconditioning, altered mental status with confusion, disorientation, impaired functional mobility, impaired ADL performance, generalized weakness, minimal command follow, and decreased awareness/insight. She was also noted to be fidgety, with garbled speech, inattentiveness, and resistance to activity. At current, she requires total assist for all ADLs at bed level, as well as total assist for rolling. Edge of bed & out of bed attempts were deferred for safety, given the pt's significant deficits. OT will continue to follow the pt for further services to facilitate improved ADL performance & to decrease the risk for progressive functional decline.      Recommendations for follow up therapy are one component of a multi-disciplinary discharge planning process, led by the attending physician.  Recommendations may be updated based on patient status, additional functional criteria and insurance authorization.   Follow Up Recommendations  Skilled nursing-short term rehab (<3 hours/day)     Assistance Recommended at Discharge Frequent or constant Supervision/Assistance  Patient can return home with the following Assistance with feeding;Two people to help with walking and/or transfers;Two people to help with bathing/dressing/bathroom;Direct supervision/assist for medications management    Functional Status Assessment  Patient has had a recent decline in their functional  status and demonstrates the ability to make significant improvements in function in a reasonable and predictable amount of time.        Precautions / Restrictions Precautions Precautions: Fall Required Braces or Orthoses:  (distal LLE cast) Restrictions Weight Bearing Restrictions: Yes LLE Weight Bearing: Non weight bearing      Mobility Bed Mobility Overal bed mobility: Needs Assistance Bed Mobility: Rolling Rolling: +2 for physical assistance, +2 for safety/equipment, Total assist              Transfers      General transfer comment: not attempted, given the pt's absent command follow and altered mental status          ADL either performed or assessed with clinical judgement   ADL    General ADL Comments: At current, the pt currently requires total assist for all ADLs at bed level, based on clinical judgement and her observed functional abilities       Pertinent Vitals/Pain Pain Assessment Pain Assessment: Faces Pain Score: 4  Pain Location: L ankle with sliding leg to EOB Pain Descriptors / Indicators: Discomfort Pain Intervention(s): Limited activity within patient's tolerance, Repositioned     Hand Dominance     Extremity/Trunk Assessment Upper Extremity Assessment Upper Extremity Assessment: Difficult to assess due to impaired cognition   Lower Extremity Assessment Lower Extremity Assessment: Difficult to assess due to impaired cognition        Communication     Cognition Arousal/Alertness: Awake/alert Behavior During Therapy: Restless, Anxious Overall Cognitive Status: Impaired/Different from baseline      General Comments: absent command follow, unable to state name or respond to other orientation questions, mumbled & difficult to understand speech, resistant to activity, altered mental status, fidgety  Home Living Family/patient expects to be discharged to:: Skilled nursing facility          Additional Comments:  Pt's niece was present at the end of the session and stated, prior to the pt's recent ankle fracture, the pt lived at home with her daughter, and she was independent with ADLs, ambulation, cooking, cleaning, and driving. The pt has been at a SNF receiving rehab.              OT Problem List: Decreased strength;Decreased range of motion;Decreased activity tolerance;Impaired balance (sitting and/or standing);Decreased cognition;Decreased safety awareness;Decreased knowledge of use of DME or AE;Decreased knowledge of precautions;Pain      OT Treatment/Interventions: Self-care/ADL training;Therapeutic exercise;Therapeutic activities;Cognitive remediation/compensation;Energy conservation;DME and/or AE instruction;Patient/family education;Balance training    OT Goals(Current goals can be found in the care plan section) Acute Rehab OT Goals OT Goal Formulation: Patient unable to participate in goal setting Time For Goal Achievement: 06/08/22 Potential to Achieve Goals:  (Guarded at current) ADL Goals Pt Will Perform Eating: with supervision;with set-up;sitting Pt Will Perform Grooming: with supervision;with set-up;sitting Pt Will Perform Upper Body Dressing: with supervision;sitting;with set-up Pt Will Transfer to Toilet: with min assist;bedside commode;stand pivot transfer  OT Frequency: Min 2X/week    Co-evaluation PT/OT/SLP Co-Evaluation/Treatment: Yes Reason for Co-Treatment: Necessary to address cognition/behavior during functional activity;To address functional/ADL transfers PT goals addressed during session: Mobility/safety with mobility OT goals addressed during session: ADL's and self-care      AM-PAC OT "6 Clicks" Daily Activity     Outcome Measure Help from another person eating meals?: Total Help from another person taking care of personal grooming?: Total Help from another person toileting, which includes using toliet, bedpan, or urinal?: Total Help from another person  bathing (including washing, rinsing, drying)?: Total Help from another person to put on and taking off regular upper body clothing?: Total Help from another person to put on and taking off regular lower body clothing?: Total 6 Click Score: 6   End of Session Nurse Communication: Other (comment) (Nurse cleared the pt for participation in therapy)  Activity Tolerance: Other (comment) (Limited by altered mental status) Patient left: in bed;with call bell/phone within reach;with bed alarm set;with family/visitor present  OT Visit Diagnosis: Other symptoms and signs involving cognitive function;Muscle weakness (generalized) (M62.81)                Time: 8889-1694 OT Time Calculation (min): 20 min Charges:  OT General Charges $OT Visit: 1 Visit OT Evaluation $OT Eval Moderate Complexity: 1 Mod    Auriella Wieand L Kellyn Mccary, OTR/L 05/25/2022, 4:48 PM

## 2022-05-25 NOTE — Evaluation (Signed)
Physical Therapy Evaluation Patient Details Name: Brandy Wheeler MRN: [id number] DOB: Jul 28, 1931 Today's Date: 05/25/2022  History of Present Illness  89 y.o. female with medical history significant for bladder cancer s/p robotic cystectomy and ileal conduit diversion 05/09/2022, anemia, L tib/fib fx NWB (pt has cast) who presented to the ED from SNF for evaluation of poor oral intake and weakness.  Patient is not able to provide history due to encephalopathy. Dx of sepsis, UTI.  Clinical Impression  Pt admitted with above diagnosis.  Pt currently with functional limitations due to the deficits listed below (see PT Problem List). Pt will benefit from skilled PT to increase their independence and safety with mobility to allow discharge to the venue listed below.     The patient  aroused with stimulation,  does not follow any  commands, very reflexive /resistive to attempts for mouth care,a nd  moving extremities.  Patient  comes from SNF fro rehab after L ankle fracture recently.   HR up in 130's when agitated.     Recommendations for follow up therapy are one component of a multi-disciplinary discharge planning process, led by the attending physician.  Recommendations may be updated based on patient status, additional functional criteria and insurance authorization.  Follow Up Recommendations Skilled nursing-short term rehab (<3 hours/day) Can patient physically be transported by private vehicle: No    Assistance Recommended at Discharge Frequent or constant Supervision/Assistance  Patient can return home with the following  Two people to help with walking and/or transfers;A lot of help with bathing/dressing/bathroom;Assistance with cooking/housework;Assist for transportation;Help with stairs or ramp for entrance    Equipment Recommendations None recommended by PT  Recommendations for Other Services       Functional Status Assessment Patient has had a recent decline in their functional status  and demonstrates the ability to make significant improvements in function in a reasonable and predictable amount of time.     Precautions / Restrictions Precautions Precautions: Fall Restrictions LLE Weight Bearing: Non weight bearing      Mobility  Bed Mobility   Bed Mobility: Rolling Rolling: +2 for physical assistance, +2 for safety/equipment, Total assist         General bed mobility comments: patient not participating in rolling, bed pad used. Did not atempt  sitting, patient more agitated and HR elevated    Transfers                        Ambulation/Gait                  Stairs            Wheelchair Mobility    Modified Rankin (Stroke Patients Only)       Balance                                             Pertinent Vitals/Pain Pain Assessment Faces Pain Scale: Hurts a little bit Breathing: occasional labored breathing, short period of hyperventilation Negative Vocalization: occasional moan/groan, low speech, negative/disapproving quality Facial Expression: facial grimacing Body Language: tense, distressed pacing, fidgeting Consolability: distracted or reassured by voice/touch PAINAD Score: 6    Home Living Family/patient expects to be discharged to:: Skilled nursing facility                        Prior  Function                       Hand Dominance        Extremity/Trunk Assessment        Lower Extremity Assessment RLE Deficits / Details: patient slightl;y flexes the legs LLE Deficits / Details: cast in place L foot and ankle       Communication      Cognition Arousal/Alertness: Awake/alert Behavior During Therapy: Agitated Overall Cognitive Status: Impaired/Different from baseline                                 General Comments: patient  did not follow any commands, at times stated, NoNo and some other words.  Limited attention, very resitive and reflexive  to mouth care and touching patient        General Comments General comments (skin integrity, edema, etc.): NT    Exercises     Assessment/Plan    PT Assessment Patient needs continued PT services  PT Problem List Decreased range of motion;Decreased activity tolerance;Decreased balance;Decreased mobility;Decreased knowledge of use of DME;Decreased strength;Pain       PT Treatment Interventions Patient/family education;Cognitive remediation;Balance training;Functional mobility training;Therapeutic exercise    PT Goals (Current goals can be found in the Care Plan section)  Acute Rehab PT Goals PT Goal Formulation: Patient unable to participate in goal setting Time For Goal Achievement: 06/08/22 Potential to Achieve Goals: Fair    Frequency Min 2X/week     Co-evaluation PT/OT/SLP Co-Evaluation/Treatment: Yes Reason for Co-Treatment: Necessary to address cognition/behavior during functional activity;To address functional/ADL transfers PT goals addressed during session: Mobility/safety with mobility OT goals addressed during session: ADL's and self-care       AM-PAC PT "6 Clicks" Mobility  Outcome Measure Help needed turning from your back to your side while in a flat bed without using bedrails?: Total Help needed moving from lying on your back to sitting on the side of a flat bed without using bedrails?: Total Help needed moving to and from a bed to a chair (including a wheelchair)?: Total Help needed standing up from a chair using your arms (e.g., wheelchair or bedside chair)?: Total Help needed to walk in hospital room?: Total Help needed climbing 3-5 steps with a railing? : Total 6 Click Score: 6    End of Session   Activity Tolerance: Treatment limited secondary to agitation Patient left: in bed;with call bell/phone within reach;with chair alarm set Nurse Communication: Mobility status PT Visit Diagnosis: Difficulty in walking, not elsewhere classified  (R26.2);Pain Pain - Right/Left: Left Pain - part of body: Ankle and joints of foot    Time: 9381-0175 PT Time Calculation (min) (ACUTE ONLY): 18 min   Charges:   PT Evaluation $PT Eval Low Complexity: 1 Low          [place] Office [phone] Weekend [phone]   Claretha Cooper 05/25/2022, 4:08 PM

## 2022-05-25 NOTE — Evaluation (Addendum)
OT Cancellation Note  Patient Details Name: Brandy Wheeler MRN: [id number] DOB: Jun 08, 1931   Cancelled Treatment:     Pt lethargic - Not ready for swallow evaluation.  Ebony Hail, RN, advised she will contact SLP if pt awakens adequately for evaluation.   Kathleen Lime, MS [facility] SLP Acute Rehab Services Office [phone]   Macario Golds 05/25/2022, 8:00 AM

## 2022-05-25 NOTE — Progress Notes (Signed)
Subjective/Chief Complaint:   1 - Oligometastatic Bladder Cancer - s/p robotic cystectomy / node dissection / conduit diversion 1/17/2 for pT4N1Mx high grade urothelial cancer. Margins negative.  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 BUN 40's some mental status changes. CT w/o fluid collections or complicating factors, peri-op stents in expected position. Pct <.10. UA with expected small bacteruria but no fevers. Brandy Wheeler 1/31 pending, NGTD. Prior CX 05/09/2022 e. Coli Res bactrim, sens others. Pulm screen negative. Placed on empiric maxipime.   Today "Brandy Wheeler" is stable. Transferred to stepdown yesterday as level of arousal remains very poor. No focal deficits. Labs remain encouraging.Some low grade fevers. She is acutally a bit improved today.   Objective: Vital signs in last 24 hours: Temp:  [97.5 F (36.4 C)-100.8 F (38.2 C)] 98.1 F (36.7 C) (02/02 0800) Pulse Rate:  [85-136] 106 (02/02 1000) Resp:  [14-26] 17 (02/02 1000) BP: (104-157)/(34-127) 133/47 (02/02 1000) SpO2:  [91 %-100 %] 98 % (02/02 1000) Last BM Date :  (uta)  Intake/Output from previous day: 02/01 0701 - 02/02 0700 In: 1863.3 [P.O.:237; I.V.:1426.3; IV Piggyback:200] Out: 1850 [Urine:1850] Intake/Output this shift: Total I/O In: 164.7 [I.V.:64.7; IV Piggyback:100] Out: -   EXAM: NAD, Resting, arousable but only with stimulation. AOx1 at present, but arousal improved compared to yesterday. Daughter at bedside as well.  Non-labored breathing on RA Stable regular tachycardia low 100s SNTND, RLQ Urostomy pink and patent of non-foul urine and scant mucus with Rt (red) and Lt (blue) bander stents No CVAT  Lab Results:  Recent Labs    05/24/22 0724 05/25/22 0443  WBC 11.8* 10.1  HGB 9.2* 7.8*  HCT 30.9* 25.9*  PLT 408* 316   BMET Recent Labs    05/24/22 0724 05/25/22 0443  NA 141 144  K 4.9 4.4   CL 111 117*  CO2 22 23  GLUCOSE 112* 128*  BUN 46* 50*  CREATININE 0.99 1.09*  CALCIUM 11.7* 11.7*   PT/INR Recent Labs    05/23/22 1600  LABPROT 15.8*  INR 1.3*   ABG Recent Labs    05/24/22 1740  PHART 7.37  HCO3 23.0    Studies/Results: DG CHEST PORT 1 VIEW  Result Date: 05/24/2022 CLINICAL DATA:  Tachypnea, history of bladder carcinoma EXAM: PORTABLE CHEST 1 VIEW COMPARISON:  05/23/2022 FINDINGS: Cardiac shadow is stable. Right chest wall port is noted in satisfactory position. Aortic calcifications are seen. Lungs are well aerated bilaterally. No focal infiltrate or sizable effusion is noted. Persistent subcutaneous emphysema is noted related to prior laparoscopy IMPRESSION: No acute abnormality noted. Electronically Signed   By: Inez Catalina M.D.   On: 05/24/2022 17:13   DG Tibia/Fibula Right  Result Date: 05/24/2022 CLINICAL DATA:  Fall.  Right leg pain.  Right ankle pain. EXAM: RIGHT TIBIA AND FIBULA - 2 VIEW; RIGHT ANKLE - COMPLETE 3+ VIEW COMPARISON:  Right ankle radiographs 11/23/2013, 10/27/2013, and 10/10/2013; CT abdomen and pelvis 05/23/2022 FINDINGS: Right ankle: Interval healing of the previously seen oblique distal fibular metadiaphyseal fracture. There is increased periosteal thickening of the anterior aspect of the distal fibular diaphysis, presumably from healing of that fracture. Minimal persistent fracture line lucency is seen in the region of the prior 2015 acute fracture, presumably now the patient's chronic baseline. The ankle mortise is symmetric and intact. Small plantar calcaneal heel spur. No acute fracture is seen. Moderate talonavicular joint space  narrowing. Right tibia and fibula: No acute fracture is seen within the more proximal right tibia or fibula. There is mild cortical lucency/thinning of the anterior tibial diaphyseal cortex at mid height. Mild-to-moderate medial compartment of the knee joint space narrowing. There is moderate scattered  subcutaneous air about the posteromedial greater than the lateral aspect of the partially visualized distal right thigh. Note is made of extensive subcutaneous anterior abdominopelvic wall emphysema seen extending along the anterior pelvis and bilateral proximal thighs on 05/23/2022 CT, presumably extending down the right thigh to the right knee on the current radiographs. IMPRESSION: Compared to 11/23/2013 1. Interval healing of previously seen distal fibular metadiaphyseal fracture. Minimal persistent fracture line lucency is seen in the region of the prior 2015 acute fracture, presumably now the patient's chronic baseline. 2. There is lucency with possible mild "hair on end" periosteal reaction within the anterolateral aspect of the right tibial diaphysis that is nonspecific. This may be secondary to chronic stress related changes or infectious/inflammatory process. This may be subacute to chronic posttraumatic change. It is difficult to exclude an infectious or malignant etiology. CT or MRI of the right tibia may be helpful for further evaluation. Electronically Signed   By: Yvonne Kendall M.D.   On: 05/24/2022 13:47   DG Ankle Complete Right  Result Date: 05/24/2022 CLINICAL DATA:  Fall.  Right leg pain.  Right ankle pain. EXAM: RIGHT TIBIA AND FIBULA - 2 VIEW; RIGHT ANKLE - COMPLETE 3+ VIEW COMPARISON:  Right ankle radiographs 11/23/2013, 10/27/2013, and 10/10/2013; CT abdomen and pelvis 05/23/2022 FINDINGS: Right ankle: Interval healing of the previously seen oblique distal fibular metadiaphyseal fracture. There is increased periosteal thickening of the anterior aspect of the distal fibular diaphysis, presumably from healing of that fracture. Minimal persistent fracture line lucency is seen in the region of the prior 2015 acute fracture, presumably now the patient's chronic baseline. The ankle mortise is symmetric and intact. Small plantar calcaneal heel spur. No acute fracture is seen. Moderate  talonavicular joint space narrowing. Right tibia and fibula: No acute fracture is seen within the more proximal right tibia or fibula. There is mild cortical lucency/thinning of the anterior tibial diaphyseal cortex at mid height. Mild-to-moderate medial compartment of the knee joint space narrowing. There is moderate scattered subcutaneous air about the posteromedial greater than the lateral aspect of the partially visualized distal right thigh. Note is made of extensive subcutaneous anterior abdominopelvic wall emphysema seen extending along the anterior pelvis and bilateral proximal thighs on 05/23/2022 CT, presumably extending down the right thigh to the right knee on the current radiographs. IMPRESSION: Compared to 11/23/2013 1. Interval healing of previously seen distal fibular metadiaphyseal fracture. Minimal persistent fracture line lucency is seen in the region of the prior 2015 acute fracture, presumably now the patient's chronic baseline. 2. There is lucency with possible mild "hair on end" periosteal reaction within the anterolateral aspect of the right tibial diaphysis that is nonspecific. This may be secondary to chronic stress related changes or infectious/inflammatory process. This may be subacute to chronic posttraumatic change. It is difficult to exclude an infectious or malignant etiology. CT or MRI of the right tibia may be helpful for further evaluation. Electronically Signed   By: Yvonne Kendall M.D.   On: 05/24/2022 13:47   CT ABDOMEN PELVIS W CONTRAST  Result Date: 05/23/2022 CLINICAL DATA:  Postoperative abdominal pain. Radical cystectomy and ileal conduit performed on 05/09/2022. Urothelial carcinoma of the bladder. EXAM: CT ABDOMEN AND PELVIS WITH CONTRAST TECHNIQUE: Multidetector  CT imaging of the abdomen and pelvis was performed using the standard protocol following bolus administration of intravenous contrast. RADIATION DOSE REDUCTION: This exam was performed according to the  departmental dose-optimization program which includes automated exposure control, adjustment of the mA and/or kV according to patient size and/or use of iterative reconstruction technique. CONTRAST:  104m OMNIPAQUE IOHEXOL 300 MG/ML  SOLN COMPARISON:  CT abdomen and pelvis 04/29/2022. CT chest abdomen and pelvis 03/01/2022. FINDINGS: Lower chest: Chest wall emphysema present compatible with recent surgery. There is a new 5 mm left lower lobe nodule. Hepatobiliary: No focal liver abnormality is seen. No gallstones, gallbladder wall thickening, or biliary dilatation. Pancreas: Unremarkable. No pancreatic ductal dilatation or surrounding inflammatory changes. Spleen: Normal in size without focal abnormality. Adrenals/Urinary Tract: Patient is status post cystectomy with new right-sided abdominal ileal conduit/urinary diversion. There is mild bilateral hydronephrosis without obstructing calculus. Bilateral ureteral stents are present. There is no perinephric fluid collection or stranding. There is normal renal enhancement bilaterally. The adrenal glands are within normal limits. Stomach/Bowel: Appendix is not seen. No evidence of bowel wall thickening, distention, or inflammatory changes. There is a moderate-sized hiatal hernia. Stomach is nondilated. There is sigmoid colon diverticulosis. Vascular/Lymphatic: Aortic atherosclerosis. No enlarged abdominal or pelvic lymph nodes. Reproductive: Status post hysterectomy. No adnexal masses. Other: There is extensive subcutaneous abdominal and pelvic wall emphysema compatible with recent surgery. There is no abdominal wall hernia. No focal fluid collections are identified. Musculoskeletal: Degenerative changes affect the spine. IMPRESSION: 1. Patient is status post cystectomy with right-sided ileal conduit/urinary diversion. There is mild bilateral hydronephrosis without obstructing calculus. Bilateral ureteral stents are in place. 2. Extensive subcutaneous emphysema  compatible with recent surgery. 3. Moderate-sized hiatal hernia. 4. Aortic atherosclerosis. 5. There is a new 5 mm left lower lobe nodule. Metastatic disease not excluded. Aortic Atherosclerosis (ICD10-I70.0). Electronically Signed   By: ARonney AstersM.D.   On: 05/23/2022 21:20   DG Chest 2 View  Result Date: 05/23/2022 CLINICAL DATA:  Suspected sepsis. Weakness. Patient had robotic assisted laparoscopic cystectomy with ileal conduit and pelvic lymph node dissection on 05/09/2022. EXAM: CHEST - 2 VIEW COMPARISON:  03/01/2022 FINDINGS: Patient has RIGHT-sided PowerPort, tip overlying the superior vena cava. Heart size is normal. The lungs are free of focal consolidations and pleural effusions. There is no pulmonary edema. There is subcutaneous emphysema in the soft tissues of the UPPER abdomen, likely related to recent laparoscopic procedure. IMPRESSION: 1. No evidence for acute cardiopulmonary abnormality. 2. Subcutaneous emphysema in the UPPER abdomen, likely related to recent laparoscopic procedure. Consider CT of the abdomen and pelvis as needed. Electronically Signed   By: ENolon NationsM.D.   On: 05/23/2022 16:13    Anti-infectives: Anti-infectives (From admission, onward)    Start     Dose/Rate Route Frequency Ordered Stop   05/23/22 2000  ceFEPIme (MAXIPIME) 2 g in sodium chloride 0.9 % 100 mL IVPB        2 g 200 mL/hr over 30 Minutes Intravenous Every 12 hours 05/23/22 1935     05/23/22 1930  ceFEPIme (MAXIPIME) 2 g in sodium chloride 0.9 % 100 mL IVPB  Status:  Discontinued        2 g 200 mL/hr over 30 Minutes Intravenous  Once 05/23/22 1923 05/23/22 1935   05/23/22 1615  cefTRIAXone (ROCEPHIN) 2 g in sodium chloride 0.9 % 100 mL IVPB        2 g 200 mL/hr over 30 Minutes Intravenous  Once 05/23/22 1614 05/23/22  1740       Assessment/Plan:  1 - Oligometastatic Bladder Cancer - no further cancer related intervention this admission. Favor monitoring alone with consideration of  systemic therapy for gross disease.   2 - Post-OP Failure to Thrive, Dehydration, Rule Out Infection - No surgical indications. Agree with hydration, empiric early pyelo treatment pending further CX data. She is certainly not yet back to baseline, but thankfully improving. Agree leading DX early pyelo with resultatn mental status changes.   Greatly appreciate hospitalist team comanagment. Daughter updated at bedside as well.    [name] 05/25/2022

## 2022-05-25 NOTE — Progress Notes (Signed)
1945 05/24/2022:  Patient brought from 4th floor to room 1225. Patient obtunded and difficult to awaken. Patient would attempt to open eyes, but not able to maintain eye open and respond to any mentation questions. Patient was able to follow some simple commands, wiggled her toes and gave a weak thumbs up bilaterally. Patient is very hard of hearing.   Family updated at bedside, all questions answered.    05/25/2022 0655: No new events overnight, patient continues to be difficult to awaken. Following some commands, but continues to be very weak and lethargic. No acute changes in vital signs overnight.   Blood pressure (!) 126/51, pulse 96, temperature 98.7 F (37.1 C), temperature source Axillary, resp. rate (!) 26, SpO2 100 %.

## 2022-05-26 DIAGNOSIS — E43 Unspecified severe protein-calorie malnutrition: Secondary | ICD-10-CM | POA: Diagnosis not present

## 2022-05-26 DIAGNOSIS — E875 Hyperkalemia: Secondary | ICD-10-CM | POA: Diagnosis not present

## 2022-05-26 DIAGNOSIS — C679 Malignant neoplasm of bladder, unspecified: Secondary | ICD-10-CM

## 2022-05-26 DIAGNOSIS — M86161 Other acute osteomyelitis, right tibia and fibula: Secondary | ICD-10-CM

## 2022-05-26 DIAGNOSIS — E876 Hypokalemia: Secondary | ICD-10-CM

## 2022-05-26 DIAGNOSIS — G9341 Metabolic encephalopathy: Secondary | ICD-10-CM | POA: Diagnosis not present

## 2022-05-26 DIAGNOSIS — N3001 Acute cystitis with hematuria: Secondary | ICD-10-CM | POA: Diagnosis not present

## 2022-05-26 DIAGNOSIS — A419 Sepsis, unspecified organism: Secondary | ICD-10-CM | POA: Diagnosis not present

## 2022-05-26 LAB — CBC
HCT: 24.3 % — ABNORMAL LOW (ref 36.0–46.0)
Hemoglobin: 7.1 g/dL — ABNORMAL LOW (ref 12.0–15.0)
MCH: 27.7 pg (ref 26.0–34.0)
MCHC: 29.2 g/dL — ABNORMAL LOW (ref 30.0–36.0)
MCV: 94.9 fL (ref 80.0–100.0)
Platelets: 266 10*3/uL (ref 150–400)
RBC: 2.56 MIL/uL — ABNORMAL LOW (ref 3.87–5.11)
RDW: 15.2 % (ref 11.5–15.5)
WBC: 9.4 10*3/uL (ref 4.0–10.5)
nRBC: 0 % (ref 0.0–0.2)

## 2022-05-26 LAB — BASIC METABOLIC PANEL
Anion gap: 6 (ref 5–15)
BUN: 36 mg/dL — ABNORMAL HIGH (ref 8–23)
CO2: 21 mmol/L — ABNORMAL LOW (ref 22–32)
Calcium: 9.1 mg/dL (ref 8.9–10.3)
Chloride: 120 mmol/L — ABNORMAL HIGH (ref 98–111)
Creatinine, Ser: 0.89 mg/dL (ref 0.44–1.00)
GFR, Estimated: >60 mL/min (ref >60)
Glucose, Bld: 129 mg/dL — ABNORMAL HIGH (ref 70–99)
Potassium: 4.3 mmol/L (ref 3.5–5.1)
Sodium: 147 mmol/L — ABNORMAL HIGH (ref 135–145)

## 2022-05-26 LAB — GLUCOSE, CAPILLARY
Glucose-Capillary: 106 mg/dL — ABNORMAL HIGH (ref 70–99)
Glucose-Capillary: 117 mg/dL — ABNORMAL HIGH (ref 70–99)
Glucose-Capillary: 103 mg/dL — ABNORMAL HIGH (ref 70–99)
Glucose-Capillary: 132 mg/dL — ABNORMAL HIGH (ref 70–99)
Glucose-Capillary: 150 mg/dL — ABNORMAL HIGH (ref 70–99)
Glucose-Capillary: 138 mg/dL — ABNORMAL HIGH (ref 70–99)
Glucose-Capillary: 149 mg/dL — ABNORMAL HIGH (ref 70–99)

## 2022-05-26 LAB — URINE CULTURE: Culture: >=100000 — AB

## 2022-05-26 LAB — COMPREHENSIVE METABOLIC PANEL
ALT: 8 U/L (ref 0–44)
AST: 16 U/L (ref 15–41)
Albumin: <1.5 g/dL — ABNORMAL LOW (ref 3.5–5.0)
Alkaline Phosphatase: 111 U/L (ref 38–126)
Anion gap: 4 — ABNORMAL LOW (ref 5–15)
BUN: 30 mg/dL — ABNORMAL HIGH (ref 8–23)
CO2: 18 mmol/L — ABNORMAL LOW (ref 22–32)
Calcium: 7.6 mg/dL — ABNORMAL LOW (ref 8.9–10.3)
Chloride: 126 mmol/L — ABNORMAL HIGH (ref 98–111)
Creatinine, Ser: 0.72 mg/dL (ref 0.44–1.00)
GFR, Estimated: >60 mL/min (ref >60)
Glucose, Bld: 93 mg/dL (ref 70–99)
Potassium: 3.3 mmol/L — ABNORMAL LOW (ref 3.5–5.1)
Sodium: 148 mmol/L — ABNORMAL HIGH (ref 135–145)
Total Bilirubin: 0.2 mg/dL — ABNORMAL LOW (ref 0.3–1.2)
Total Protein: 3.8 g/dL — ABNORMAL LOW (ref 6.5–8.1)

## 2022-05-26 LAB — PARATHYROID HORMONE, INTACT (NO CA): PTH: 6 pg/mL — ABNORMAL LOW (ref 15–65)

## 2022-05-26 LAB — HEMOGLOBIN AND HEMATOCRIT, BLOOD
HCT: 26.7 % — ABNORMAL LOW (ref 36.0–46.0)
Hemoglobin: 7.8 g/dL — ABNORMAL LOW (ref 12.0–15.0)

## 2022-05-26 LAB — SODIUM: Sodium: 143 mmol/L (ref 135–145)

## 2022-05-26 MED ORDER — LIP MEDEX EX OINT
1.0000 | TOPICAL_OINTMENT | CUTANEOUS | Status: DC | PRN
  Administered 2022-05-26: 1 via TOPICAL
  Filled 2022-05-26: qty 7

## 2022-05-26 MED ORDER — VANCOMYCIN HCL IN DEXTROSE 1-5 GM/200ML-% IV SOLN
1000.0000 mg | Freq: Once | INTRAVENOUS | Status: AC
  Administered 2022-05-26: 1000 mg via INTRAVENOUS
  Filled 2022-05-26: qty 200

## 2022-05-26 MED ORDER — POTASSIUM CHLORIDE 10 MEQ/100ML IV SOLN
10.0000 meq | INTRAVENOUS | Status: AC
  Administered 2022-05-26 (×4): 10 meq via INTRAVENOUS
  Filled 2022-05-26 (×4): qty 100

## 2022-05-26 MED ORDER — VANCOMYCIN HCL 750 MG/150ML IV SOLN
750.0000 mg | INTRAVENOUS | Status: DC
  Administered 2022-05-27 – 2022-06-01 (×6): 750 mg via INTRAVENOUS
  Filled 2022-05-26 (×8): qty 150

## 2022-05-26 MED ORDER — DEXTROSE-NACL 5-0.45 % IV SOLN: INTRAVENOUS | Status: DC

## 2022-05-26 MED ORDER — DEXTROSE 5 % IV SOLN: INTRAVENOUS | Status: DC

## 2022-05-26 NOTE — Progress Notes (Signed)
Notified by MRI tech that they will be unable to perform this patient's MRI as they have other STAT MRIs still pending today. Tentative plan to do MRI early 2/4.

## 2022-05-26 NOTE — Progress Notes (Signed)
SLP Cancellation Note  Patient Details Name: Brandy Wheeler MRN: [id number] DOB: Jan 05, 1932   Cancelled treatment:       Reason Eval/Treat Not Completed: Fatigue/lethargy limiting ability to participate; nursing denotes continued lethargy; ST will continue efforts.   Pat [name],M.S., CCC-SLP 05/26/2022, 1:53 PM

## 2022-05-26 NOTE — Progress Notes (Signed)
Pharmacy Antibiotic Note  Brandy Wheeler is a 89 y.o. female admitted on 05/23/2022 with sepsis.  Patient initially on cefepime but today urine culture resulted with MRSA.  MD discontinued cefepime and Pharmacy has been consulted for Vancomycin dosing for MRSA UTI.  Plan: Vancomycin 1 g IV x 1 then 750 mg IV every 24 hours (Goal AUC 400-550, eAUC 516.4, SCr used: rounded up to 0.8) Monitor clinical progress, renal function, vancomycin levels as indicated   Height: '5\' 3"'$  (160 cm) Weight: 53.4 kg (117 lb 11.6 oz) (pt weighed at 0000 05/26/22) IBW/kg (Calculated) : 52.4  Temp (24hrs), Avg:99.2 F (37.3 C), Min:98.1 F (36.7 C), Max:99.8 F (37.7 C)  Recent Labs  Lab 05/23/22 1600 05/24/22 0724 05/24/22 2346 05/25/22 0443 05/26/22 0500  WBC 15.8* 11.8*  --  10.1 9.4  CREATININE 0.97 0.99  --  1.09* 0.72  LATICACIDVEN 1.2  --  0.9  --   --     Estimated Creatinine Clearance: 40.2 mL/min (by C-G formula based on SCr of 0.72 mg/dL).    No Known Allergies  Antimicrobials this admission: 1/31 CTX x 1 1/31 cefepime >> 2/3 2/3 vancomycin >>   Dose adjustments this admission:   Microbiology results: 1/31 BCx: NGTD 1/31 UCx: MRSA    Thank you for allowing pharmacy to be a part of this patient's care.  Royetta Asal, PharmD, BCPS Clinical Pharmacist [place] Please utilize Amion for appropriate phone number to reach the unit pharmacist ([place]) 05/26/2022 11:53 AM

## 2022-05-26 NOTE — Consult Note (Signed)
[facility]    Date of Admission:  05/23/2022     Reason for Consult: Sepsis     Referring Physician: Dr Lonny Prude  Current antibiotics: Vancomycin  ASSESSMENT:    89 y.o. female admitted with:  MRSA, Strep mitis/oralis UTI:  Urine cx positive for these organisms in setting of sepsis presentation and recent urologic surgery with UA suggestive of infection.  No evidence of bacteremia at this time but presence of MRSA in urine cx could raise some concern for occult bacteremia. Cystectomy with ileal conduit diversion: Completed by urology on 05/09/22. Possible early OM vs metastatic lesion: Noted on CT right tib/fib Encephalopathy: Mental status slowly improved per family at bedside but not back at baseline.  Elevated BUN/Cr: Improving. Severe malnutrition, hypernatremia Hypercalcemia: Improved.  RECOMMENDATIONS:    Continue vancomycin per pharmacy Follow up blood cultures -- NGTD Check TTE Follow up MRI lower extremity for better assessment of abnormal tib/fib imaging and CT guided biopsy to determine if likely infection vs metastatic lesion If further work up shows concern for disseminated infection, then would consider PAC removal, however, will hold off for now pending more information to come back Will follow, Dr Baxter Flattery back on Monday.    Principal Problem:   Sepsis due to urinary tract infection (HCC) Active Problems:   Bladder cancer (HCC)   Hypercalcemia   Hyperkalemia   Normocytic anemia   Acute metabolic encephalopathy   Protein-calorie malnutrition, severe   Hypokalemia   MEDICATIONS:    Scheduled Meds:  Chlorhexidine Gluconate Cloth  6 each Topical Daily   heparin  5,000 Units Subcutaneous Q8H   sodium chloride flush  3 mL Intravenous Q12H   Continuous Infusions:  dextrose 75 mL/hr at 05/26/22 1524   [START ON 05/27/2022] vancomycin     PRN Meds:.acetaminophen **OR** acetaminophen, lip balm, ondansetron **OR** ondansetron  (ZOFRAN) IV, senna-docusate  HPI:    Brandy Wheeler is a 89 y.o. female with PMHx of bladder cancer s/p recent robotic cystectomy and ileal conduit diversion 05/09/22 with urology admitted due to encephalopathy and concern for sepsis due to urinary source.    She was febrile with elevated WBC on admission with UA showing positive nitrites, pyuria, and bacteria.  Urine and blood cultures were done and she was started on CTX.  Urine cx now growing MRSA and Strep mitis/oralis.  Blood cx are NGTD including 1 set done from her [place].  She was also started on Vancomycin today after urine cultures resulted and ceftriaxone was discontinued.  There was also report of right ankle/leg pain and imaging was done with CT showing concern for early OM vs metastatic lesion.  This was discussed with orthopedics whom favored the latter and recommended MRI and then possible CT guided biopsy.  She also had a CT abd/pelvis at admission with post-surgical changes and extensive subcutaneous emphysema c/w recent surgery.  We have been consulted for antibiotic recommendations.     Past Medical History:  Diagnosis Date   Bladder cancer ([place])    Diverticulitis    Fracture of right fibula 10/10/2013   closed   GERD (gastroesophageal reflux disease)    History of hiatal hernia    Hypertension     Social History   Tobacco Use   Smoking status: Never   Smokeless tobacco: Never  Vaping Use   Vaping Use: Never used  Substance Use Topics   Alcohol use: Yes   Drug use: No    No family history on file.  No Known Allergies  Review of Systems  Unable to perform ROS: Mental acuity    OBJECTIVE:   Blood pressure (!) 156/47, pulse (!) 102, temperature 98.2 F (36.8 C), temperature source Axillary, resp. rate 20, height '5\' 3"'$  (1.6 m), weight 53.4 kg, SpO2 100 %. Body mass index is 20.85 kg/m.  Physical Exam Constitutional:      Comments: Ill appearing woman, lying in bed, family/friends at bedside.  She is speaking  some but mostly unintelligible.   HENT:     Head: Normocephalic and atraumatic.  Eyes:     Extraocular Movements: Extraocular movements intact.     Conjunctiva/sclera: Conjunctivae normal.  Pulmonary:     Effort: Pulmonary effort is normal. No respiratory distress.  Abdominal:     General: There is no distension.     Palpations: Abdomen is soft.     Tenderness: There is no abdominal tenderness.     Comments: Urostomy bag in place and surgical incisions well healed.   Musculoskeletal:     Cervical back: Normal range of motion and neck supple.     Comments: Right tibial nodule noted but no erythema, fluctuance, drainage.  Left lower extremity in cast.   Skin:    General: Skin is warm and dry.     Findings: No rash.  Neurological:     General: No focal deficit present.     Cranial Nerves: No cranial nerve deficit.      Lab Results: Lab Results  Component Value Date   WBC 9.4 05/26/2022   HGB 7.8 (L) 05/26/2022   HCT 26.7 (L) 05/26/2022   MCV 94.9 05/26/2022   PLT 266 05/26/2022    Lab Results  Component Value Date   NA 143 05/26/2022   K 4.3 05/26/2022   CO2 21 (L) 05/26/2022   GLUCOSE 129 (H) 05/26/2022   BUN 36 (H) 05/26/2022   CREATININE 0.89 05/26/2022   CALCIUM 9.1 05/26/2022   GFRNONAA >60 05/26/2022    Lab Results  Component Value Date   ALT 8 05/26/2022   AST 16 05/26/2022   ALKPHOS 111 05/26/2022   BILITOT 0.2 (L) 05/26/2022    No results found for: "CRP"  No results found for: "ESRSEDRATE"  I have reviewed the micro and lab results in Epic.  Imaging: CT TIBIA FIBULA RIGHT WO CONTRAST  Result Date: 05/26/2022 CLINICAL DATA:  Permeative lesion in the mid tibia. History of bladder cancer. EXAM: CT OF THE LOWER RIGHT EXTREMITY WITHOUT CONTRAST TECHNIQUE: Multidetector CT imaging of the right lower extremity was performed according to the standard protocol. RADIATION DOSE REDUCTION: This exam was performed according to the departmental  dose-optimization program which includes automated exposure control, adjustment of the mA and/or kV according to patient size and/or use of iterative reconstruction technique. COMPARISON:  Radiographs 05/24/2022 FINDINGS: Bones/Joint/Cartilage CT confirms a poorly defined permeative lesion in the mid to distal diaphysis of the tibia anteriorly with a somewhat moth-eaten appearance of the cortex, substantial irregular periosteal reaction, and endosteal indistinctness in the region of concern. This extends over a an 11.9 cm excursion and primarily involves the anterior, medial, and lateral cortex of the tibial shaft. Hypodensity in the immediately adjacent soft tissues likely from edema, but without a large soft tissue mass component. In addition, there is circumferential periosteal reaction in the distal fibular metadiaphysis. The patient's original fracture in this vicinity was from 2015 and this degree of periosteal reaction and mildly moth-eaten cortex would not be a typical postoperative finding from 2015.  This is probably a similar process to that involving the tibia and is thought to be acute. Bony demineralization underlying the lateral talar process as on image 62 series 7 and image 283 series 6. Ligaments Suboptimally assessed by CT. Muscles and Tendons Gas tracks along fascia planes adjacent to some of the muscle groups particularly in the knee region. Soft tissues There is substantial gas tracking along fascia planes in the right knee (and in the left knee but only seen in a very limited manner on the left). On prior CT abdomen the patient has subcutaneous emphysema in the chest, anterior and lateral abdominal wall, along the labia, and in the anterior thighs, and presumably the gas visible on today's exam represents that process tracking down. IMPRESSION: 1. Permeative lesion in the mid to distal diaphysis of the right tibia with substantial periosteal reaction and endosteal indistinctness. Similar  periosteal reaction and milder permeative moth-eaten lesion in the distal fibular metadiaphysis. Localized edema/fluid along the regions of maximum involvement but no obvious soft tissue mass. 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 2. Bony demineralization underlying the lateral talar process. This could also be an indicator of early osteomyelitis. 3. Gas tracking along fascia planes and subcutaneous tissues in the knee regions bilaterally, likely representing the inferior extent of the subcutaneous emphysema of the chest, abdomen, and thighs shown on CT abdomen imaging. Electronically Signed   By: Van Clines M.D.   On: 05/26/2022 13:01     Imaging independently reviewed in Epic.  [facility] [phone] pager 05/26/2022, 6:42 PM   I have personally spent 80 minutes involved in face-to-face and non-face-to-face activities for this patient on the day of the visit. Professional time spent includes the following activities: Preparing to see the patient (review of tests), Obtaining and/or reviewing separately obtained history (admission/discharge record), Performing a medically appropriate examination and/or evaluation , Ordering medications/tests/procedures, referring and communicating with other health care professionals, Documenting clinical information in the EMR, Independently interpreting results (not separately reported), Communicating results to the patient/family/caregiver, Counseling and educating the patient/family/caregiver and Care coordination (not separately reported).

## 2022-05-26 NOTE — Progress Notes (Signed)
PROGRESS NOTE    [name]  TOI:[id number] DOB: 1932/01/12 DOA: 05/23/2022 PCP: Pcp, No   Brief Narrative: Brandy Wheeler is a 89 y.o. female with a history of bladder cancer s/p robotic cystectomy and ileal conduit diversion, GERD and anemia. Patient presented secondary to altered mental status and found to have evidence of sepsis secondary to UTI. Empiric antibiotics started. Hospitalization complicated by persistent altered mental status and severe hypercalcemia.   Assessment and Plan:  Acute metabolic encephalopathy Present on admission. With hypercalcemia improved, it seems mental status is more likely related to infection. CT head unremarkable for acute process. -NPO -If no improvement with change in antibiotics, will obtain an MRI -If no improvement by 2/4, will place Cortrak feeding tube (discussed with family who are in agreement)  Severe sepsis Present/criteria met on admission. Secondary to UTI. Blood and urine cultures obtained on admission. Procalcitonin negative. Urine culture is significant for MRSA and streptococcus mitas/oralis. Blood cultures are no growth to date.  UTI Urinalysis suggests UTI. Complicated by recent urologic procedure. Patient started empirically on Cefepime. Urine culture culture significant for MRSA and streptococcus mitis/oralis -Discontinue Cefepime and start Vancomycin  Hypercalcemia Appears to be more likely related to underlying cancer. Patient is also on vitamin D supplementation. Calcium of 12.3 on admission. 1,25- and 25-hydroxy vitamin D levels of 44.3 and 91.4 respectively (wnl). Zometa and calcitonin given with uncorrected calcium down to 7.6 this morning. PTH of 6 (low) -Continue IV fluids (switched to D5 water secondary to hypernatremia) -Repeat BMP in AM -Obtain PTHrP  Right ankle/leg pain CT tib/fib suggests early osteomyelitis. CT report also suggests underlying metastatic lesion. -ID consult -May need to obtain MRI but will  await ID recommendations  Elevated creatinine Mild elevation with associated elevated BUN of 50. Patient with mild hydronephrosis on admission. Good urine output noted. BUN and creatinine improved this morning. -Strict in/out -Continue IV fluids -If continued worsening, repeat imaging (renal ultrasound)  Hypernatremia Secondary to poor oral intake and isotonic saline for treatment of hypercalcemia. -D5 water  Hyperkalemia Mild. Resolved.  Hypokalemia Mild. -Potassium supplementation  GERD -Hold PPI while NPO  Anemia Appears to be post-operative. Hemoglobin of 9.7 on admission with decrease. Stable at 7.8 today. No obvious evidence of bleeding. Likely related to dilution effect. -Transfuse for hemoglobin <7  Left tib/fib fracture Patient follows with orthopedic surgery and has a casted left leg. She is non-weight bearing of that leg -PT/OT eval pending improvement of mental status  Oligometastatic bladder cancer Patient follows with oncology and urology. S/p robotic cystectomy with node dissection and conduit diversion.  Severe malnutrition Dietitian recommendations: Prosource Plus PO TID, each provides 100 kcals and 15g protein  Magic cup TID with meals, each supplement provides 290 kcal and 9 grams of protein  Multivitamin with minerals daily  DVT prophylaxis: Heparin subq Code Status:   Code Status: Full Code Family Communication: None at bedside. Daughter via telephone Disposition Plan: Discharge back to SNF likely in 3+ days pending improvement of mental status, improvement of hypercalcemia, transition to oral antibiotics if able   Consultants:  Urology  Procedures:  None  Antimicrobials: Cefepime  Ceftriaxone   Subjective: Patient responds to name. States no pain.  Objective: BP (!) 144/45   Pulse (!) 112   Temp 98.6 F (37 C) (Oral)   Resp (!) 28   Ht '5\' 3"'$  (1.6 m)   Wt 53.4 kg Comment: pt weighed at 0000 05/26/22  SpO2 100%   BMI 20.85 kg/m  Examination:  General exam: Appears mostly calm and comfortable Respiratory system: Clear to auscultation. Respiratory effort normal. Cardiovascular system: S1 & S2 heard. Slight tachycardia with regular rhythm. Gastrointestinal system: Abdomen is nondistended, soft and nontender. Normal bowel sounds heard. Central nervous system: Somnolent but arouses slightly when called. Responds to questions sometimes appropriately. Able to follow very simple, low energy requests. Musculoskeletal: LLE 2+ pedal edema.    Data Reviewed: I have personally reviewed following labs and imaging studies  CBC Lab Results  Component Value Date   WBC 9.4 05/26/2022   RBC 2.56 (L) 05/26/2022   HGB 7.1 (L) 05/26/2022   HCT 24.3 (L) 05/26/2022   MCV 94.9 05/26/2022   MCH 27.7 05/26/2022   PLT 266 05/26/2022   MCHC 29.2 (L) 05/26/2022   RDW 15.2 05/26/2022   LYMPHSABS 2.6 05/23/2022   MONOABS 1.1 (H) 05/23/2022   EOSABS 0.3 05/23/2022   BASOSABS 0.1 05/39/7673     Last metabolic panel Lab Results  Component Value Date   NA 148 (H) 05/26/2022   K 3.3 (L) 05/26/2022   CL 126 (H) 05/26/2022   CO2 18 (L) 05/26/2022   BUN 30 (H) 05/26/2022   CREATININE 0.72 05/26/2022   GLUCOSE 93 05/26/2022   GFRNONAA >60 05/26/2022   CALCIUM 7.6 (L) 05/26/2022   PROT 3.8 (L) 05/26/2022   ALBUMIN <1.5 (L) 05/26/2022   BILITOT 0.2 (L) 05/26/2022   ALKPHOS 111 05/26/2022   AST 16 05/26/2022   ALT 8 05/26/2022   ANIONGAP 4 (L) 05/26/2022    GFR: Estimated Creatinine Clearance: 40.2 mL/min (by C-G formula based on SCr of 0.72 mg/dL).  Recent Results (from the past 240 hour(s))  Urine Culture (for pregnant, neutropenic or urologic patients or patients with an indwelling urinary catheter)     Status: Abnormal   Collection Time: 05/23/22  3:50 PM   Specimen: Urine, Clean Catch  Result Value Ref Range Status   Specimen Description   Final    URINE, CLEAN CATCH Performed at [facility],  [place] [address] [place], [place] [address]    Special Requests   Final    NONE Performed at [facility], [place] [address] [place], [place] [address]    Culture (A)  Final    >=100,000 COLONIES/mL METHICILLIN RESISTANT STAPHYLOCOCCUS AUREUS 60,000 COLONIES/mL STREPTOCOCCUS MITIS/ORALIS    Report Status 05/26/2022 FINAL  Final   Organism ID, Bacteria METHICILLIN RESISTANT STAPHYLOCOCCUS AUREUS (A)  Final      Susceptibility   Methicillin resistant staphylococcus aureus - MIC*    CIPROFLOXACIN >=8 RESISTANT Resistant     GENTAMICIN <=0.5 SENSITIVE Sensitive     NITROFURANTOIN <=16 SENSITIVE Sensitive     OXACILLIN >=4 RESISTANT Resistant     TETRACYCLINE <=1 SENSITIVE Sensitive     VANCOMYCIN 1 SENSITIVE Sensitive     TRIMETH/SULFA <=10 SENSITIVE Sensitive     CLINDAMYCIN <=0.25 SENSITIVE Sensitive     RIFAMPIN <=0.5 SENSITIVE Sensitive     Inducible Clindamycin NEGATIVE Sensitive     * >=100,000 COLONIES/mL METHICILLIN RESISTANT STAPHYLOCOCCUS AUREUS  Culture, blood (Routine X 2) w Reflex to ID Panel     Status: None (Preliminary result)   Collection Time: 05/23/22  4:00 PM   Specimen: BLOOD  Result Value Ref Range Status   Specimen Description   Final    BLOOD PORTA CATH Performed at [place] [address], [place], [place] [address]    Special Requests   Final  BOTTLES DRAWN AEROBIC AND ANAEROBIC Blood Culture results may not be optimal due to an inadequate volume of blood received in culture bottles Performed at [facility], [place] [address], [place], [place] [address]    Culture   Final    NO GROWTH 3 DAYS Performed at [facility], [place] [address] [place], [place] [address]    Report Status PENDING  Incomplete  Resp panel by RT-PCR (RSV, Flu A&B, Covid) Anterior Nasal Swab     Status: None   Collection Time: 05/23/22  4:02 PM   Specimen: Anterior Nasal Swab  Result Value Ref Range Status   SARS  Coronavirus 2 by RT PCR NEGATIVE NEGATIVE Final    Comment: (NOTE) SARS-CoV-2 target nucleic acids are NOT DETECTED.  The SARS-CoV-2 RNA is generally detectable in upper respiratory specimens during the acute phase of infection. The lowest concentration of SARS-CoV-2 viral copies this assay can detect is 138 copies/mL. A negative result does not preclude SARS-Cov-2 infection and should not be used as the sole basis for treatment or other patient management decisions. A negative result may occur with  improper specimen collection/handling, submission of specimen other than nasopharyngeal swab, presence of viral mutation(s) within the areas targeted by this assay, and inadequate number of viral copies(<138 copies/mL). A negative result must be combined with clinical observations, patient history, and epidemiological information. The expected result is Negative.  Fact Sheet for Patients:  EntrepreneurPulse.com.au  Fact Sheet for Healthcare Providers:  IncredibleEmployment.be  This test is no t yet approved or cleared by the [place] FDA and  has been authorized for detection and/or diagnosis of SARS-CoV-2 by FDA under an Emergency Use Authorization (EUA). This EUA will remain  in effect (meaning this test can be used) for the duration of the COVID-19 declaration under Section 564(b)(1) of the Act, 21 U.S.C.section 360bbb-3(b)(1), unless the authorization is terminated  or revoked sooner.       Influenza A by PCR NEGATIVE NEGATIVE Final   Influenza B by PCR NEGATIVE NEGATIVE Final    Comment: (NOTE) The Xpert Xpress SARS-CoV-2/FLU/RSV plus assay is intended as an aid in the diagnosis of influenza from Nasopharyngeal swab specimens and should not be used as a sole basis for treatment. Nasal washings and aspirates are unacceptable for Xpert Xpress SARS-CoV-2/FLU/RSV testing.  Fact Sheet for  Patients: EntrepreneurPulse.com.au  Fact Sheet for Healthcare Providers: IncredibleEmployment.be  This test is not yet approved or cleared by the [place] FDA and has been authorized for detection and/or diagnosis of SARS-CoV-2 by FDA under an Emergency Use Authorization (EUA). This EUA will remain in effect (meaning this test can be used) for the duration of the COVID-19 declaration under Section 564(b)(1) of the Act, 21 U.S.C. section 360bbb-3(b)(1), unless the authorization is terminated or revoked.     Resp Syncytial Virus by PCR NEGATIVE NEGATIVE Final    Comment: (NOTE) Fact Sheet for Patients: EntrepreneurPulse.com.au  Fact Sheet for Healthcare Providers: IncredibleEmployment.be  This test is not yet approved or cleared by the [place] FDA and has been authorized for detection and/or diagnosis of SARS-CoV-2 by FDA under an Emergency Use Authorization (EUA). This EUA will remain in effect (meaning this test can be used) for the duration of the COVID-19 declaration under Section 564(b)(1) of the Act, 21 U.S.C. section 360bbb-3(b)(1), unless the authorization is terminated or revoked.  Performed at [facility], [place] [address] [place], [place] [address]   Culture, blood (Routine X 2) w Reflex  to ID Panel     Status: None (Preliminary result)   Collection Time: 05/23/22  9:47 PM   Specimen: BLOOD  Result Value Ref Range Status   Specimen Description   Final    BLOOD BLOOD RIGHT HAND Performed at [place] [address], [place], [place] [address]    Special Requests   Final    BOTTLES DRAWN AEROBIC ONLY Blood Culture results may not be optimal due to an inadequate volume of blood received in culture bottles Performed at [place] [address] [place], [place] [address]    Culture   Final    NO GROWTH 3 DAYS Performed  at [facility], [place] [address] [place], [place] [address]    Report Status PENDING  Incomplete  Respiratory (~20 pathogens) panel by PCR     Status: None   Collection Time: 05/24/22  6:39 PM   Specimen: Nasopharyngeal Swab; Respiratory  Result Value Ref Range Status   Adenovirus NOT DETECTED NOT DETECTED Final   Coronavirus 229E NOT DETECTED NOT DETECTED Final    Comment: (NOTE) The Coronavirus on the Respiratory Panel, DOES NOT test for the novel  Coronavirus (2019 nCoV)    Coronavirus HKU1 NOT DETECTED NOT DETECTED Final   Coronavirus NL63 NOT DETECTED NOT DETECTED Final   Coronavirus OC43 NOT DETECTED NOT DETECTED Final   Metapneumovirus NOT DETECTED NOT DETECTED Final   Rhinovirus / Enterovirus NOT DETECTED NOT DETECTED Final   Influenza A NOT DETECTED NOT DETECTED Final   Influenza B NOT DETECTED NOT DETECTED Final   Parainfluenza Virus 1 NOT DETECTED NOT DETECTED Final   Parainfluenza Virus 2 NOT DETECTED NOT DETECTED Final   Parainfluenza Virus 3 NOT DETECTED NOT DETECTED Final   Parainfluenza Virus 4 NOT DETECTED NOT DETECTED Final   Respiratory Syncytial Virus NOT DETECTED NOT DETECTED Final   Bordetella pertussis NOT DETECTED NOT DETECTED Final   Bordetella Parapertussis NOT DETECTED NOT DETECTED Final   Chlamydophila pneumoniae NOT DETECTED NOT DETECTED Final   Mycoplasma pneumoniae NOT DETECTED NOT DETECTED Final    Comment: Performed at [facility], [place]. [address] [place], [place] [address]      Radiology Studies: DG CHEST PORT 1 VIEW  Result Date: 05/24/2022 CLINICAL DATA:  Tachypnea, history of bladder carcinoma EXAM: PORTABLE CHEST 1 VIEW COMPARISON:  05/23/2022 FINDINGS: Cardiac shadow is stable. Right chest wall port is noted in satisfactory position. Aortic calcifications are seen. Lungs are well aerated bilaterally. No focal infiltrate or sizable effusion is noted. Persistent subcutaneous emphysema is noted related to prior laparoscopy  IMPRESSION: No acute abnormality noted. Electronically Signed   By: Inez Catalina M.D.   On: 05/24/2022 17:13   DG Tibia/Fibula Right  Result Date: 05/24/2022 CLINICAL DATA:  Fall.  Right leg pain.  Right ankle pain. EXAM: RIGHT TIBIA AND FIBULA - 2 VIEW; RIGHT ANKLE - COMPLETE 3+ VIEW COMPARISON:  Right ankle radiographs 11/23/2013, 10/27/2013, and 10/10/2013; CT abdomen and pelvis 05/23/2022 FINDINGS: Right ankle: Interval healing of the previously seen oblique distal fibular metadiaphyseal fracture. There is increased periosteal thickening of the anterior aspect of the distal fibular diaphysis, presumably from healing of that fracture. Minimal persistent fracture line lucency is seen in the region of the prior 2015 acute fracture, presumably now the patient's chronic baseline. The ankle mortise is symmetric and intact. Small plantar calcaneal heel spur. No acute fracture is seen. Moderate talonavicular joint space narrowing. Right tibia and fibula: No  acute fracture is seen within the more proximal right tibia or fibula. There is mild cortical lucency/thinning of the anterior tibial diaphyseal cortex at mid height. Mild-to-moderate medial compartment of the knee joint space narrowing. There is moderate scattered subcutaneous air about the posteromedial greater than the lateral aspect of the partially visualized distal right thigh. Note is made of extensive subcutaneous anterior abdominopelvic wall emphysema seen extending along the anterior pelvis and bilateral proximal thighs on 05/23/2022 CT, presumably extending down the right thigh to the right knee on the current radiographs. IMPRESSION: Compared to 11/23/2013 1. Interval healing of previously seen distal fibular metadiaphyseal fracture. Minimal persistent fracture line lucency is seen in the region of the prior 2015 acute fracture, presumably now the patient's chronic baseline. 2. There is lucency with possible mild "hair on end" periosteal reaction  within the anterolateral aspect of the right tibial diaphysis that is nonspecific. This may be secondary to chronic stress related changes or infectious/inflammatory process. This may be subacute to chronic posttraumatic change. It is difficult to exclude an infectious or malignant etiology. CT or MRI of the right tibia may be helpful for further evaluation. Electronically Signed   By: Yvonne Kendall M.D.   On: 05/24/2022 13:47   DG Ankle Complete Right  Result Date: 05/24/2022 CLINICAL DATA:  Fall.  Right leg pain.  Right ankle pain. EXAM: RIGHT TIBIA AND FIBULA - 2 VIEW; RIGHT ANKLE - COMPLETE 3+ VIEW COMPARISON:  Right ankle radiographs 11/23/2013, 10/27/2013, and 10/10/2013; CT abdomen and pelvis 05/23/2022 FINDINGS: Right ankle: Interval healing of the previously seen oblique distal fibular metadiaphyseal fracture. There is increased periosteal thickening of the anterior aspect of the distal fibular diaphysis, presumably from healing of that fracture. Minimal persistent fracture line lucency is seen in the region of the prior 2015 acute fracture, presumably now the patient's chronic baseline. The ankle mortise is symmetric and intact. Small plantar calcaneal heel spur. No acute fracture is seen. Moderate talonavicular joint space narrowing. Right tibia and fibula: No acute fracture is seen within the more proximal right tibia or fibula. There is mild cortical lucency/thinning of the anterior tibial diaphyseal cortex at mid height. Mild-to-moderate medial compartment of the knee joint space narrowing. There is moderate scattered subcutaneous air about the posteromedial greater than the lateral aspect of the partially visualized distal right thigh. Note is made of extensive subcutaneous anterior abdominopelvic wall emphysema seen extending along the anterior pelvis and bilateral proximal thighs on 05/23/2022 CT, presumably extending down the right thigh to the right knee on the current radiographs. IMPRESSION:  Compared to 11/23/2013 1. Interval healing of previously seen distal fibular metadiaphyseal fracture. Minimal persistent fracture line lucency is seen in the region of the prior 2015 acute fracture, presumably now the patient's chronic baseline. 2. There is lucency with possible mild "hair on end" periosteal reaction within the anterolateral aspect of the right tibial diaphysis that is nonspecific. This may be secondary to chronic stress related changes or infectious/inflammatory process. This may be subacute to chronic posttraumatic change. It is difficult to exclude an infectious or malignant etiology. CT or MRI of the right tibia may be helpful for further evaluation. Electronically Signed   By: Yvonne Kendall M.D.   On: 05/24/2022 13:47      LOS: 3 days    Cordelia Poche, MD [facility] 05/26/2022, 10:01 AM   If 7PM-7AM, please contact night-coverage [email or web address]

## 2022-05-27 ENCOUNTER — Inpatient Hospital Stay (HOSPITAL_COMMUNITY): Payer: Medicare HMO

## 2022-05-27 DIAGNOSIS — R7881 Bacteremia: Secondary | ICD-10-CM | POA: Diagnosis not present

## 2022-05-27 DIAGNOSIS — A419 Sepsis, unspecified organism: Secondary | ICD-10-CM | POA: Diagnosis not present

## 2022-05-27 DIAGNOSIS — G9341 Metabolic encephalopathy: Secondary | ICD-10-CM | POA: Diagnosis not present

## 2022-05-27 DIAGNOSIS — E875 Hyperkalemia: Secondary | ICD-10-CM | POA: Diagnosis not present

## 2022-05-27 LAB — CBC
HCT: 27.1 % — ABNORMAL LOW (ref 36.0–46.0)
Hemoglobin: 8 g/dL — ABNORMAL LOW (ref 12.0–15.0)
MCH: 26.9 pg (ref 26.0–34.0)
MCHC: 29.5 g/dL — ABNORMAL LOW (ref 30.0–36.0)
MCV: 91.2 fL (ref 80.0–100.0)
Platelets: 296 10*3/uL (ref 150–400)
RBC: 2.97 MIL/uL — ABNORMAL LOW (ref 3.87–5.11)
RDW: 14.9 % (ref 11.5–15.5)
WBC: 11 10*3/uL — ABNORMAL HIGH (ref 4.0–10.5)
nRBC: 0 % (ref 0.0–0.2)

## 2022-05-27 LAB — ECHOCARDIOGRAM COMPLETE
AR max vel: 2.71 cm2
AV Area VTI: 3.22 cm2
AV Area mean vel: 2.39 cm2
AV Mean grad: 2.5 mmHg
AV Peak grad: 4.7 mmHg
Ao pk vel: 1.09 m/s
Area-P 1/2: 5.13 cm2
Calc EF: 62.6 %
Height: 63 in
S' Lateral: 2.3 cm
Single Plane A2C EF: 67.5 %
Single Plane A4C EF: 60 %
Weight: 1883.61 oz

## 2022-05-27 LAB — GLUCOSE, CAPILLARY
Glucose-Capillary: 114 mg/dL — ABNORMAL HIGH (ref 70–99)
Glucose-Capillary: 125 mg/dL — ABNORMAL HIGH (ref 70–99)
Glucose-Capillary: 133 mg/dL — ABNORMAL HIGH (ref 70–99)
Glucose-Capillary: 143 mg/dL — ABNORMAL HIGH (ref 70–99)
Glucose-Capillary: 149 mg/dL — ABNORMAL HIGH (ref 70–99)

## 2022-05-27 LAB — COMPREHENSIVE METABOLIC PANEL
ALT: 10 U/L (ref 0–44)
AST: 20 U/L (ref 15–41)
Albumin: 1.7 g/dL — ABNORMAL LOW (ref 3.5–5.0)
Alkaline Phosphatase: 142 U/L — ABNORMAL HIGH (ref 38–126)
Anion gap: 5 (ref 5–15)
BUN: 27 mg/dL — ABNORMAL HIGH (ref 8–23)
CO2: 21 mmol/L — ABNORMAL LOW (ref 22–32)
Calcium: 8.9 mg/dL (ref 8.9–10.3)
Chloride: 116 mmol/L — ABNORMAL HIGH (ref 98–111)
Creatinine, Ser: 0.68 mg/dL (ref 0.44–1.00)
GFR, Estimated: >60 mL/min (ref >60)
Glucose, Bld: 164 mg/dL — ABNORMAL HIGH (ref 70–99)
Potassium: 3.8 mmol/L (ref 3.5–5.1)
Sodium: 142 mmol/L (ref 135–145)
Total Bilirubin: 0.3 mg/dL (ref 0.3–1.2)
Total Protein: 4.9 g/dL — ABNORMAL LOW (ref 6.5–8.1)

## 2022-05-27 MED ORDER — GADOBUTROL 1 MMOL/ML IV SOLN
5.0000 mL | Freq: Once | INTRAVENOUS | Status: AC | PRN
  Administered 2022-05-27: 5 mL via INTRAVENOUS

## 2022-05-27 NOTE — Progress Notes (Signed)
PROGRESS NOTE    Brandy Wheeler  HLK:[id number] DOB: June 19, 1931 DOA: 05/23/2022 PCP: Pcp, No   Brief Narrative: Brandy Wheeler is a 89 y.o. female with a history of bladder cancer s/p robotic cystectomy and ileal conduit diversion, GERD and anemia. Patient presented secondary to altered mental status and found to have evidence of sepsis secondary to UTI. Empiric antibiotics started. Hospitalization complicated by persistent altered mental status and severe hypercalcemia.   Assessment and Plan:  Acute metabolic encephalopathy Present on admission. With hypercalcemia improved, it seems mental status is more likely related to infection. CT head unremarkable for acute process. Patient is showing improvement after adjusting antibiotics. -NPO; SLP evaluation for possible diet advancement today -MRI brain today -If unable to advance diet today, will place Cortrak feeding tube (discussed with family who are in agreement)  Severe sepsis Present/criteria met on admission. Secondary to UTI. Blood and urine cultures obtained on admission. Procalcitonin negative. Urine culture is significant for MRSA and streptococcus mitas/oralis. Blood cultures are no growth to date.  UTI Urinalysis suggests UTI. Complicated by recent urologic procedure. Patient started empirically on Cefepime. Urine culture culture significant for MRSA and streptococcus mitis/oralis -Continue Vancomycin  Hypercalcemia Appears to be more likely related to underlying cancer. Patient is also on vitamin D supplementation. Calcium of 12.3 on admission. 1,25- and 25-hydroxy vitamin D levels of 44.3 and 91.4 respectively (wnl). PTH of 6 (low). Zometa and calcitonin given. Calcium improved. Corrected calcium of 10.7.  -D5 1/2 NS -Daily BMP -PTHrP pending  Right ankle/leg pain CT tib/fib suggests early osteomyelitis. CT report also suggests underlying metastatic lesion. Discussed with on-call [place] surgery, Dr. Grandville Silos, who  recommended MRI vs CT guided biopsy. Discussed with IR who recommended MRI followed by CT-guided biopsy. ID consulted -MRI tib/fib w/wo contrast pending -ID recommendations: Transthoracic Echocardiogram, follow-up blood cultures  Elevated creatinine Mild elevation with associated elevated BUN of 50. Patient with mild hydronephrosis on admission. Good urine output noted. BUN and creatinine improved. -Strict in/out -Continue IV fluids  Hypernatremia Secondary to poor oral intake and isotonic saline for treatment of hypercalcemia. -D5 water  Abdominal tenderness Unclear etiology. No associated emesis. Patient with documented bowel movements. -Abdominal x-ray  Hyperkalemia Mild. Resolved.  Hypokalemia Mild. Resolved with potassium supplementation  GERD -Hold PPI while NPO  Anemia Appears to be post-operative. Hemoglobin of 9.7 on admission with decrease. Stablized. No obvious evidence of bleeding. Likely related to dilution effect. -Transfuse for hemoglobin <7  Left tib/fib fracture Patient follows with orthopedic surgery and has a casted left leg. She is non-weight bearing of that leg -PT/OT eval pending improvement of mental status  Oligometastatic bladder cancer Patient follows with oncology and urology. S/p robotic cystectomy with node dissection and conduit diversion.  Severe malnutrition Dietitian recommendations: Prosource Plus PO TID, each provides 100 kcals and 15g protein  Magic cup TID with meals, each supplement provides 290 kcal and 9 grams of protein  Multivitamin with minerals daily  DVT prophylaxis: Heparin subq Code Status:   Code Status: Full Code Family Communication: None at bedside. Daughter/son-in-law via telephone Disposition Plan: Discharge back to SNF likely in 3+ days pending improvement of mental status, improvement of hypercalcemia, transition to oral antibiotics if able   Consultants:  Urology Infectious disease  Procedures:   None  Antimicrobials: Cefepime  Ceftriaxone Vancomycin   Subjective: Patient expresses a desire to get up. Not able to get much else regarding history.  Objective: BP (!) 174/88   Pulse 100   Temp 98.5  F (36.9 C) (Axillary)   Resp (!) 21   Ht '5\' 3"'$  (1.6 m)   Wt 53.4 kg Comment: pt weighed at 0000 05/26/22  SpO2 99%   BMI 20.85 kg/m   Examination:  General exam: Appears calm and comfortable Respiratory system: Clear to auscultation. Respiratory effort normal. Cardiovascular system: S1 & S2 heard, RRR. No murmurs. Gastrointestinal system: Abdomen is nondistended, soft and nontender. Normal bowel sounds heard. Central nervous system: Alert and oriented to person. Some dysarthria noted.    Data Reviewed: I have personally reviewed following labs and imaging studies  CBC Lab Results  Component Value Date   WBC 9.4 05/26/2022   RBC 2.56 (L) 05/26/2022   HGB 7.8 (L) 05/26/2022   HCT 26.7 (L) 05/26/2022   MCV 94.9 05/26/2022   MCH 27.7 05/26/2022   PLT 266 05/26/2022   MCHC 29.2 (L) 05/26/2022   RDW 15.2 05/26/2022   LYMPHSABS 2.6 05/23/2022   MONOABS 1.1 (H) 05/23/2022   EOSABS 0.3 05/23/2022   BASOSABS 0.1 42/39/5320     Last metabolic panel Lab Results  Component Value Date   NA 142 05/27/2022   K 3.8 05/27/2022   CL 116 (H) 05/27/2022   CO2 21 (L) 05/27/2022   BUN 27 (H) 05/27/2022   CREATININE 0.68 05/27/2022   GLUCOSE 164 (H) 05/27/2022   GFRNONAA >60 05/27/2022   CALCIUM 8.9 05/27/2022   PROT 4.9 (L) 05/27/2022   ALBUMIN 1.7 (L) 05/27/2022   BILITOT 0.3 05/27/2022   ALKPHOS 142 (H) 05/27/2022   AST 20 05/27/2022   ALT 10 05/27/2022   ANIONGAP 5 05/27/2022    GFR: Estimated Creatinine Clearance: 40.2 mL/min (by C-G formula based on SCr of 0.68 mg/dL).  Recent Results (from the past 240 hour(s))  Urine Culture (for pregnant, neutropenic or urologic patients or patients with an indwelling urinary catheter)     Status: Abnormal   Collection  Time: 05/23/22  3:50 PM   Specimen: Urine, Clean Catch  Result Value Ref Range Status   Specimen Description   Final    URINE, CLEAN CATCH Performed at [facility], [place] [address], [place], [place] [address]    Special Requests   Final    NONE Performed at [facility] [place] [address] [place], [place] [address]    Culture (A)  Final    >=100,000 COLONIES/mL METHICILLIN RESISTANT STAPHYLOCOCCUS AUREUS 60,000 COLONIES/mL STREPTOCOCCUS MITIS/ORALIS    Report Status 05/26/2022 FINAL  Final   Organism ID, Bacteria METHICILLIN RESISTANT STAPHYLOCOCCUS AUREUS (A)  Final      Susceptibility   Methicillin resistant staphylococcus aureus - MIC*    CIPROFLOXACIN >=8 RESISTANT Resistant     GENTAMICIN <=0.5 SENSITIVE Sensitive     NITROFURANTOIN <=16 SENSITIVE Sensitive     OXACILLIN >=4 RESISTANT Resistant     TETRACYCLINE <=1 SENSITIVE Sensitive     VANCOMYCIN 1 SENSITIVE Sensitive     TRIMETH/SULFA <=10 SENSITIVE Sensitive     CLINDAMYCIN <=0.25 SENSITIVE Sensitive     RIFAMPIN <=0.5 SENSITIVE Sensitive     Inducible Clindamycin NEGATIVE Sensitive     * >=100,000 COLONIES/mL METHICILLIN RESISTANT STAPHYLOCOCCUS AUREUS  Culture, blood (Routine X 2) w Reflex to ID Panel     Status: None (Preliminary result)   Collection Time: 05/23/22  4:00 PM   Specimen: BLOOD  Result Value Ref Range Status   Specimen Description   Final    BLOOD PORTA CATH Performed at [facility]  [address], [place], [place] [address]    Special Requests   Final    BOTTLES DRAWN AEROBIC AND ANAEROBIC Blood Culture results may not be optimal due to an inadequate volume of blood received in culture bottles Performed at [place] [address] [place], [place] [address]    Culture   Final    NO GROWTH 3 DAYS Performed at [facility], [place] [address], [place], [place] [address]    Report Status PENDING  Incomplete  Resp  panel by RT-PCR (RSV, Flu A&B, Covid) Anterior Nasal Swab     Status: None   Collection Time: 05/23/22  4:02 PM   Specimen: Anterior Nasal Swab  Result Value Ref Range Status   SARS Coronavirus 2 by RT PCR NEGATIVE NEGATIVE Final    Comment: (NOTE) SARS-CoV-2 target nucleic acids are NOT DETECTED.  The SARS-CoV-2 RNA is generally detectable in upper respiratory specimens during the acute phase of infection. The lowest concentration of SARS-CoV-2 viral copies this assay can detect is 138 copies/mL. A negative result does not preclude SARS-Cov-2 infection and should not be used as the sole basis for treatment or other patient management decisions. A negative result may occur with  improper specimen collection/handling, submission of specimen other than nasopharyngeal swab, presence of viral mutation(s) within the areas targeted by this assay, and inadequate number of viral copies(<138 copies/mL). A negative result must be combined with clinical observations, patient history, and epidemiological information. The expected result is Negative.  Fact Sheet for Patients:  EntrepreneurPulse.com.au  Fact Sheet for Healthcare Providers:  IncredibleEmployment.be  This test is no t yet approved or cleared by the [place] FDA and  has been authorized for detection and/or diagnosis of SARS-CoV-2 by FDA under an Emergency Use Authorization (EUA). This EUA will remain  in effect (meaning this test can be used) for the duration of the COVID-19 declaration under Section 564(b)(1) of the Act, 21 U.S.C.section 360bbb-3(b)(1), unless the authorization is terminated  or revoked sooner.       Influenza A by PCR NEGATIVE NEGATIVE Final   Influenza B by PCR NEGATIVE NEGATIVE Final    Comment: (NOTE) The Xpert Xpress SARS-CoV-2/FLU/RSV plus assay is intended as an aid in the diagnosis of influenza from Nasopharyngeal swab specimens and should not be used as a  sole basis for treatment. Nasal washings and aspirates are unacceptable for Xpert Xpress SARS-CoV-2/FLU/RSV testing.  Fact Sheet for Patients: EntrepreneurPulse.com.au  Fact Sheet for Healthcare Providers: IncredibleEmployment.be  This test is not yet approved or cleared by the [place] FDA and has been authorized for detection and/or diagnosis of SARS-CoV-2 by FDA under an Emergency Use Authorization (EUA). This EUA will remain in effect (meaning this test can be used) for the duration of the COVID-19 declaration under Section 564(b)(1) of the Act, 21 U.S.C. section 360bbb-3(b)(1), unless the authorization is terminated or revoked.     Resp Syncytial Virus by PCR NEGATIVE NEGATIVE Final    Comment: (NOTE) Fact Sheet for Patients: EntrepreneurPulse.com.au  Fact Sheet for Healthcare Providers: IncredibleEmployment.be  This test is not yet approved or cleared by the [place] FDA and has been authorized for detection and/or diagnosis of SARS-CoV-2 by FDA under an Emergency Use Authorization (EUA). This EUA will remain in effect (meaning this test can be used) for the duration of the COVID-19 declaration under Section 564(b)(1) of the Act, 21 U.S.C. section 360bbb-3(b)(1), unless the authorization is terminated or revoked.  Performed at [facility],  [place] [address], [place], [place] [address]   Culture, blood (Routine X 2) w Reflex to ID Panel     Status: None (Preliminary result)   Collection Time: 05/23/22  9:47 PM   Specimen: BLOOD  Result Value Ref Range Status   Specimen Description   Final    BLOOD BLOOD RIGHT HAND Performed at [facility] [address], [place], [place] [address]    Special Requests   Final    BOTTLES DRAWN AEROBIC ONLY Blood Culture results may not be optimal due to an inadequate volume of blood received in culture  bottles Performed at [facility] [address] [place], [place] [address]    Culture   Final    NO GROWTH 3 DAYS Performed at [facility], [place] [address] [place], [place] [address]    Report Status PENDING  Incomplete  Respiratory (~20 pathogens) panel by PCR     Status: None   Collection Time: 05/24/22  6:39 PM   Specimen: Nasopharyngeal Swab; Respiratory  Result Value Ref Range Status   Adenovirus NOT DETECTED NOT DETECTED Final   Coronavirus 229E NOT DETECTED NOT DETECTED Final    Comment: (NOTE) The Coronavirus on the Respiratory Panel, DOES NOT test for the novel  Coronavirus (2019 nCoV)    Coronavirus HKU1 NOT DETECTED NOT DETECTED Final   Coronavirus NL63 NOT DETECTED NOT DETECTED Final   Coronavirus OC43 NOT DETECTED NOT DETECTED Final   Metapneumovirus NOT DETECTED NOT DETECTED Final   Rhinovirus / Enterovirus NOT DETECTED NOT DETECTED Final   Influenza A NOT DETECTED NOT DETECTED Final   Influenza B NOT DETECTED NOT DETECTED Final   Parainfluenza Virus 1 NOT DETECTED NOT DETECTED Final   Parainfluenza Virus 2 NOT DETECTED NOT DETECTED Final   Parainfluenza Virus 3 NOT DETECTED NOT DETECTED Final   Parainfluenza Virus 4 NOT DETECTED NOT DETECTED Final   Respiratory Syncytial Virus NOT DETECTED NOT DETECTED Final   Bordetella pertussis NOT DETECTED NOT DETECTED Final   Bordetella Parapertussis NOT DETECTED NOT DETECTED Final   Chlamydophila pneumoniae NOT DETECTED NOT DETECTED Final   Mycoplasma pneumoniae NOT DETECTED NOT DETECTED Final    Comment: Performed at [facility], [place]. [address] [place], [place] [address]      Radiology Studies: CT TIBIA FIBULA RIGHT WO CONTRAST  Result Date: 05/26/2022 CLINICAL DATA:  Permeative lesion in the mid tibia. History of bladder cancer. EXAM: CT OF THE LOWER RIGHT EXTREMITY WITHOUT CONTRAST TECHNIQUE: Multidetector CT imaging of the right lower extremity was performed according to the  standard protocol. RADIATION DOSE REDUCTION: This exam was performed according to the departmental dose-optimization program which includes automated exposure control, adjustment of the mA and/or kV according to patient size and/or use of iterative reconstruction technique. COMPARISON:  Radiographs 05/24/2022 FINDINGS: Bones/Joint/Cartilage CT confirms a poorly defined permeative lesion in the mid to distal diaphysis of the tibia anteriorly with a somewhat moth-eaten appearance of the cortex, substantial irregular periosteal reaction, and endosteal indistinctness in the region of concern. This extends over a an 11.9 cm excursion and primarily involves the anterior, medial, and lateral cortex of the tibial shaft. Hypodensity in the immediately adjacent soft tissues likely from edema, but without a large soft tissue mass component. In addition, there is circumferential periosteal reaction in the distal fibular metadiaphysis. The patient's original fracture in this vicinity was from 2015 and this degree of periosteal reaction and mildly moth-eaten cortex would not be a  typical postoperative finding from 2015. This is probably a similar process to that involving the tibia and is thought to be acute. Bony demineralization underlying the lateral talar process as on image 62 series 7 and image 283 series 6. Ligaments Suboptimally assessed by CT. Muscles and Tendons Gas tracks along fascia planes adjacent to some of the muscle groups particularly in the knee region. Soft tissues There is substantial gas tracking along fascia planes in the right knee (and in the left knee but only seen in a very limited manner on the left). On prior CT abdomen the patient has subcutaneous emphysema in the chest, anterior and lateral abdominal wall, along the labia, and in the anterior thighs, and presumably the gas visible on today's exam represents that process tracking down. IMPRESSION: 1. Permeative lesion in the mid to distal diaphysis  of the right tibia with substantial periosteal reaction and endosteal indistinctness. Similar periosteal reaction and milder permeative moth-eaten lesion in the distal fibular metadiaphysis. Localized edema/fluid along the regions of maximum involvement but no obvious soft tissue mass. 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 2. Bony demineralization underlying the lateral talar process. This could also be an indicator of early osteomyelitis. 3. Gas tracking along fascia planes and subcutaneous tissues in the knee regions bilaterally, likely representing the inferior extent of the subcutaneous emphysema of the chest, abdomen, and thighs shown on CT abdomen imaging. Electronically Signed   By: Van Clines M.D.   On: 05/26/2022 13:01      LOS: 4 days    Cordelia Poche, MD [facility] 05/27/2022, 7:44 AM   If 7PM-7AM, please contact night-coverage [email or web address]

## 2022-05-27 NOTE — Evaluation (Signed)
Clinical/Bedside Swallow Evaluation Patient Details  Name: Brandy Wheeler MRN: [id number] Date of Birth: [birth year]  Today's Date: 05/27/2022 Time: SLP Start Time (ACUTE ONLY): 22 SLP Stop Time (ACUTE ONLY): 3220 SLP Time Calculation (min) (ACUTE ONLY): 15 min  Past Medical History:  Past Medical History:  Diagnosis Date   Bladder cancer ([place])    Diverticulitis    Fracture of right fibula 10/10/2013   closed   GERD (gastroesophageal reflux disease)    History of hiatal hernia    Hypertension    Past Surgical History:  Past Surgical History:  Procedure Laterality Date   ABDOMINAL HYSTERECTOMY  1978   APPENDECTOMY     AGE 88   CYSTOSCOPY W/ URETERAL STENT PLACEMENT Right 10/09/2021   Procedure: POSSIBLE BILATERAL  RETROGRADE POSSIBLE RIGHT Melene Muller STENT;  Surgeon: Lucas Mallow, MD;  Location: [facility];  Service: Urology;  Laterality: Right;   CYSTOSCOPY WITH INJECTION N/A 05/09/2022   Procedure: CYSTOSCOPY WITH INJECTION OF INDOCYANINE GREEN DYE;  Surgeon: [name], MD;  Location: [facility];  Service: Urology;  Laterality: N/A;   DIAGNOSTIC LAPAROSCOPY  1974   IR IMAGING GUIDED PORT INSERTION  11/15/2021   LYMPH NODE DISSECTION Bilateral 05/09/2022   Procedure: PELVIC LYMPH NODE DISSECTION;  Surgeon: [name], MD;  Location: [facility];  Service: Urology;  Laterality: Bilateral;   ROBOT ASSISTED LAPAROSCOPIC COMPLETE CYSTECT ILEAL CONDUIT N/A 05/09/2022   Procedure: XI ROBOTIC ASSISTED LAPAROSCOPIC COMPLETE CYSTECT ILEAL CONDUIT;  Surgeon: [name], MD;  Location: [facility];  Service: Urology;  Laterality: N/A;  [place]   to lift uterus prior to getting pregnant   TRANSURETHRAL RESECTION OF BLADDER TUMOR Bilateral 10/09/2021   Procedure: TRANSURETHRAL RESECTION OF BLADDER TUMOR (TURBT) POSSIBLE BILATERAL RETROGRADE POSSIBLE RIGHT URETERAL STENT;  Surgeon: Lucas Mallow, MD;  Location: [facility];  Service: Urology;  Laterality: Bilateral;   HPI:   Brandy Wheeler is an 89 y.o. female who presented to ED from SNF secondary to altered mental status and was found to have evidence of sepsis secondary to UTI. Prior medical history of bladder cancer s/p robotic cystectomy and ileal conduit diversion, GERD and anemia.    Assessment / Plan / Recommendation  Clinical Impression  Pt participated in clinical swallowing assessment. Granddaughter was at bedside. Pt intermittently followed one-step commands; she appeared to have a left gaze preference.  She demonstrated delays in onset of oral motor patterns for swallowing, intermittent poor oral recognition and holding of POs. At other moments she was able to recognize and effectively manipulate purees and the straw for drinking water - there were no s/s of aspiration throughout the assessment. Mental status is primary obstacle for eating at this time; airway protection was consistently adequate. Recommend beginning a dysphagia 1 diet, thin liquids; crush meds when able. Pt will need careful hand-feeding at this time. Recommendations D/W RN and granddaughter. SLP will follow for safety, diet progression. SLP Visit Diagnosis: Dysphagia, unspecified (R13.10)    Aspiration Risk    unknown   Diet Recommendation   Dysphagia 1/thin liquids  Medication Administration: Crushed with puree    Other  Recommendations Oral Care Recommendations: Oral care BID    Recommendations for follow up therapy are one component of a multi-disciplinary discharge planning process, led by the attending physician.  Recommendations may be updated based on patient status, additional functional criteria and insurance authorization.  Follow up Recommendations Other (comment) (tba)  Frequency and Duration min 2x/week  2 weeks       Prognosis Prognosis for Safe Diet Advancement: Fair Barriers to Reach Goals: Cognitive deficits      Swallow Study   General Date of Onset: 05/23/22 HPI: Brandy Wheeler is an 89 y.o.  female who presented to ED from SNF secondary to altered mental status and was found to have evidence of sepsis secondary to UTI. Prior medical history of bladder cancer s/p robotic cystectomy and ileal conduit diversion, GERD and anemia. Type of Study: Bedside Swallow Evaluation Previous Swallow Assessment: no Diet Prior to this Study: NPO Temperature Spikes Noted: No Respiratory Status: Nasal cannula History of Recent Intubation: No Behavior/Cognition: Confused;Distractible Oral Cavity Assessment: Other (comment) (not following commands for assessment) Oral Care Completed by SLP: No Oral Cavity - Dentition: Adequate natural dentition Self-Feeding Abilities: Total assist Patient Positioning: Upright in bed Baseline Vocal Quality: Low vocal intensity Volitional Cough: Cognitively unable to elicit Volitional Swallow: Unable to elicit    Oral/Motor/Sensory Function Overall Oral Motor/Sensory Function: Other (comment) (symmetric at baseline)   Ice Chips Ice chips: Within functional limits   Thin Liquid Thin Liquid: Impaired Presentation: Straw Oral Phase Impairments: Poor awareness of bolus    Nectar Thick Nectar Thick Liquid: Not tested   Honey Thick Honey Thick Liquid: Not tested   Puree Puree: Impaired Presentation: Spoon Oral Phase Impairments: Poor awareness of bolus Oral Phase Functional Implications: Oral holding   Solid     Solid: Not tested      Brandy Wheeler 05/27/2022,5:09 PM  Estill Bamberg L. Tivis Ringer, MA CCC/SLP Clinical Specialist - [place] Office number [phone]

## 2022-05-27 NOTE — Progress Notes (Signed)
  Echocardiogram 2D Echocardiogram has been performed.  Brandy Wheeler 05/27/2022, 8:33 AM

## 2022-05-28 ENCOUNTER — Inpatient Hospital Stay (HOSPITAL_COMMUNITY): Payer: Medicare HMO

## 2022-05-28 ENCOUNTER — Encounter (HOSPITAL_COMMUNITY): Payer: Self-pay | Admitting: Internal Medicine

## 2022-05-28 DIAGNOSIS — A419 Sepsis, unspecified organism: Secondary | ICD-10-CM | POA: Diagnosis not present

## 2022-05-28 DIAGNOSIS — E875 Hyperkalemia: Secondary | ICD-10-CM | POA: Diagnosis not present

## 2022-05-28 DIAGNOSIS — N390 Urinary tract infection, site not specified: Secondary | ICD-10-CM | POA: Diagnosis not present

## 2022-05-28 DIAGNOSIS — G9341 Metabolic encephalopathy: Secondary | ICD-10-CM | POA: Diagnosis not present

## 2022-05-28 LAB — CBC
HCT: 25.6 % — ABNORMAL LOW (ref 36.0–46.0)
Hemoglobin: 7.9 g/dL — ABNORMAL LOW (ref 12.0–15.0)
MCH: 27.5 pg (ref 26.0–34.0)
MCHC: 30.9 g/dL (ref 30.0–36.0)
MCV: 89.2 fL (ref 80.0–100.0)
Platelets: 265 10*3/uL (ref 150–400)
RBC: 2.87 MIL/uL — ABNORMAL LOW (ref 3.87–5.11)
RDW: 14.8 % (ref 11.5–15.5)
WBC: 11.3 10*3/uL — ABNORMAL HIGH (ref 4.0–10.5)
nRBC: 0 % (ref 0.0–0.2)

## 2022-05-28 LAB — CULTURE, BLOOD (ROUTINE X 2)
Culture: NO GROWTH
Culture: NO GROWTH

## 2022-05-28 LAB — BASIC METABOLIC PANEL
Anion gap: 7 (ref 5–15)
BUN: 23 mg/dL (ref 8–23)
CO2: 19 mmol/L — ABNORMAL LOW (ref 22–32)
Calcium: 8.3 mg/dL — ABNORMAL LOW (ref 8.9–10.3)
Chloride: 113 mmol/L — ABNORMAL HIGH (ref 98–111)
Creatinine, Ser: 0.68 mg/dL (ref 0.44–1.00)
GFR, Estimated: >60 mL/min (ref >60)
Glucose, Bld: 157 mg/dL — ABNORMAL HIGH (ref 70–99)
Potassium: 3.3 mmol/L — ABNORMAL LOW (ref 3.5–5.1)
Sodium: 139 mmol/L (ref 135–145)

## 2022-05-28 LAB — GLUCOSE, CAPILLARY
Glucose-Capillary: 134 mg/dL — ABNORMAL HIGH (ref 70–99)
Glucose-Capillary: 139 mg/dL — ABNORMAL HIGH (ref 70–99)
Glucose-Capillary: 130 mg/dL — ABNORMAL HIGH (ref 70–99)
Glucose-Capillary: 147 mg/dL — ABNORMAL HIGH (ref 70–99)
Glucose-Capillary: 154 mg/dL — ABNORMAL HIGH (ref 70–99)
Glucose-Capillary: 149 mg/dL — ABNORMAL HIGH (ref 70–99)

## 2022-05-28 LAB — CALCIUM, IONIZED: Calcium, Ionized, Serum: 5.5 mg/dL (ref 4.5–5.6)

## 2022-05-28 MED ORDER — PROSOURCE PLUS PO LIQD
30.0000 mL | Freq: Three times a day (TID) | ORAL | Status: DC
  Administered 2022-05-28 – 2022-06-05 (×12): 30 mL via ORAL
  Filled 2022-05-28 (×17): qty 30

## 2022-05-28 MED ORDER — POTASSIUM CHLORIDE 20 MEQ PO PACK
40.0000 meq | PACK | Freq: Once | ORAL | Status: AC
  Administered 2022-05-28: 40 meq via ORAL
  Filled 2022-05-28: qty 2

## 2022-05-28 MED ORDER — HEPARIN SODIUM (PORCINE) 5000 UNIT/ML IJ SOLN
5000.0000 [IU] | Freq: Three times a day (TID) | INTRAMUSCULAR | Status: DC
  Administered 2022-05-29 – 2022-06-01 (×9): 5000 [IU] via SUBCUTANEOUS
  Filled 2022-05-28 (×10): qty 1

## 2022-05-28 MED ORDER — PANTOPRAZOLE SODIUM 40 MG PO TBEC
40.0000 mg | DELAYED_RELEASE_TABLET | Freq: Every day | ORAL | Status: DC
  Administered 2022-05-28 – 2022-06-06 (×9): 40 mg via ORAL
  Filled 2022-05-28 (×9): qty 1

## 2022-05-28 NOTE — Progress Notes (Signed)
PT Cancellation Note  Patient Details Name: Brandy Wheeler MRN: [id number] DOB: 1931/11/27   Cancelled Treatment:    Reason Eval/Treat Not Completed: Patient going for MRI.will check back another time.  [place] Office [phone] Weekend [phone]  Claretha Cooper 05/28/2022, 1:45 PM

## 2022-05-28 NOTE — Progress Notes (Signed)
[facility]    Date of Admission:  05/23/2022      ID: Jaclynn Laumann is a 89 y.o. female with   Principal Problem:   Sepsis due to urinary tract infection ([place]) Active Problems:   Bladder cancer ([place])   Hypercalcemia   Hyperkalemia   Normocytic anemia   Acute metabolic encephalopathy   Protein-calorie malnutrition, severe   Hypokalemia   Acute cystitis with hematuria    Subjective: Niece at bedside states patient is more alert, only answer questions 50% appropriately. Underwent MRI of lower extremity concern for tissue mass. IR consented patient/family for biopsy  Medications:   (feeding supplement) PROSource Plus  30 mL Oral TID BM   Chlorhexidine Gluconate Cloth  6 each Topical Daily   [START ON 05/29/2022] heparin  5,000 Units Subcutaneous Q8H   pantoprazole  40 mg Oral Daily   sodium chloride flush  3 mL Intravenous Q12H    Objective: Vital signs in last 24 hours: Temp:  [97.4 F (36.3 C)-99.1 F (37.3 C)] 98.5 F (36.9 C) (02/05 1600) Pulse Rate:  [99-123] 109 (02/05 1623) Resp:  [15-29] 26 (02/05 1623) BP: (118-168)/(42-96) 153/69 (02/05 1621) SpO2:  [95 %-100 %] 100 % (02/05 1623) Weight:  [54 kg] 54 kg (02/05 0500)  Physical Exam  Constitutional:  oriented to person,only. appears well-developed and well-nourished. No distress.  HENT: [place]/AT, PERRLA, no scleral icterus Mouth/Throat: Oropharynx is clear and moist. No oropharyngeal exudate.  Cardiovascular: Normal rate, regular rhythm and normal heart sounds. Exam reveals no gallop and no friction rub.  No murmur heard.  Chest wall = portacath is c/d/i Pulmonary/Chest: Effort normal and breath sounds normal. No respiratory distress.  has no wheezes.  Neck = supple, no nuchal rigidity Abdominal: Soft. Bowel sounds are normal.  exhibits no distension. There is no tenderness.  Lymphadenopathy: no cervical adenopathy. No axillary adenopathy Neurological: alert and oriented to person,  place, and time.  Skin: Skin is warm and dry. No rash noted. No erythema.  KGM:WNUU lower leg in hard cast Psychiatric: a normal mood and affect.  behavior is normal.    Lab Results Recent Labs    05/27/22 0449 05/27/22 0805 05/28/22 0555  WBC  --  11.0* 11.3*  HGB  --  8.0* 7.9*  HCT  --  27.1* 25.6*  NA 142  --  139  K 3.8  --  3.3*  CL 116*  --  113*  CO2 21*  --  19*  BUN 27*  --  23  CREATININE 0.68  --  0.68   Liver Panel Recent Labs    05/26/22 0500 05/27/22 0449  PROT 3.8* 4.9*  ALBUMIN <1.5* 1.7*  AST 16 20  ALT 8 10  ALKPHOS 111 142*  BILITOT 0.2* 0.3   Sedimentation Rate No results for input(s): "ESRSEDRATE" in the last 72 hours. C-Reactive Protein No results for input(s): "CRP" in the last 72 hours.  Microbiology: 1/31 MRSA uti, blood cx NGTD Studies/Results: MR BRAIN WO CONTRAST  Result Date: 05/28/2022 CLINICAL DATA:  Mental status change, cause. EXAM: MRI HEAD WITHOUT CONTRAST TECHNIQUE: Multiplanar, multiecho pulse sequences of the brain and surrounding structures were obtained without intravenous contrast. COMPARISON:  None Available. FINDINGS: Brain: No acute infarction, hemorrhage, hydrocephalus, extra-axial collection or mass lesion. 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 Scattered foci of T2 hyperintensity are seen within the white matter of the cerebral hemispheres, nonspecific, most likely  related to chronic small vessel ischemia. Vascular: Normal flow voids. Skull and upper cervical spine: Normal marrow signal. Sinuses/Orbits: Decrease volume and opacification of the right maxillary sinus with mild lowering of the right orbital floor, suggesting silent sinus syndrome. Mild right mastoid effusion. Other: None. IMPRESSION: 1. No acute intracranial abnormality. 2. Mild chronic microvascular ischemic changes of the white matter. 3. 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 Electronically Signed   By: Pedro Earls M.D.   On: 05/28/2022 15:28   MR TIBIA FIBULA RIGHT W WO CONTRAST  Result Date: 05/28/2022 CLINICAL DATA:  Bone mass or bone pain, leg, aggressive features on x-ray. Primary tip tibial lesion on CT. History of bladder cancer. EXAM: MRI OF LOWER RIGHT EXTREMITY WITHOUT AND WITH CONTRAST TECHNIQUE: Multiplanar, multisequence MR imaging of the right lower leg was performed both before and after administration of intravenous contrast. CONTRAST:  66m GADAVIST GADOBUTROL 1 MMOL/ML IV SOLN COMPARISON:  Radiographs 05/24/2022.  CT 05/25/2022. FINDINGS: Bones/Joint/Cartilage Both lower legs are included on the coronal images. There are extensive widespread osseous abnormalities and both lower legs. The lesion of concern in the mid right tibial diaphysis measures approximately 4.9 cm in length and is associated with permeative destruction of the cortex anteriorly, periosteal reaction and an enhancing soft tissue mass measuring approximately 2.8 x 2.5 cm transverse on image 39/20. Similar appearing permeative lesion involving the distal right fibular diaphysis extends up to 4.1 cm in length. There are numerous additional smaller lesions throughout the right tibia as well as the visualized right tarsal bones. In the left lower leg, the marrow abnormalities are more diffuse, with near complete involvement of the tibia and a large distal fibular lesion. No large joint effusions or definite pathologic fractures are identified. Ligaments No significant ligamentous abnormalities on large field-of-view imaging. Muscles and Tendons Multifocal periosteal reaction associated with the osseous lesions described above. No intramuscular masses are identified in the right lower leg. Incidental imaging of the left lower leg demonstrates extensive edema throughout the musculature. Soft tissues The soft tissue gas around the right knee seen on the recent CT is not  clearly visualized. No focal soft tissue masses or fluid collections are identified aside from those associated with the permeative bone lesions. As above, there is periosteal edema and enhancement as well a small amount of subcutaneous edema in the mid right lower leg. In the visualized left lower leg, there is more diffuse soft tissue edema as well as apparent soft tissue emphysema medially in the proximal lower leg. IMPRESSION: 1. Widespread osseous abnormalities throughout both lower legs. This pattern can be seen with metastatic disease and multiple myeloma. As suggested on CT, given previous soft tissue findings and admission for sepsis, findings could reflect multifocal osteomyelitis. Correlate clinically and consider further systemic assessment with whole-body bone scan or PET-CT. 2. No focal soft tissue masses or fluid collections are identified in the right lower leg aside from those associated with the permeative bone lesions. 3. More extensive soft tissue abnormalities throughout the visualized left lower leg with soft tissue emphysema and edema. Electronically Signed   By: WRichardean SaleM.D.   On: 05/28/2022 08:22   DG Abd Portable 1V  Result Date: 05/27/2022 CLINICAL DATA:  Abdominal tenderness, history of ileal conduit urinary diversion EXAM: PORTABLE ABDOMEN - 1 VIEW COMPARISON:  None Available. FINDINGS: Nonobstructive pattern of bowel gas. Ureteral stent catheters in expected position for ileal conduit urinary diversion. No  free air in the abdomen on supine radiographs. IMPRESSION: Nonobstructive pattern of bowel gas. Ureteral stent catheters in expected position for ileal conduit urinary diversion. Electronically Signed   By: Delanna Ahmadi M.D.   On: 05/27/2022 16:18   ECHOCARDIOGRAM COMPLETE  Result Date: 05/27/2022    ECHOCARDIOGRAM REPORT   Patient Name:   Trenna Testa Date of Exam: 05/27/2022 Medical Rec #:  [id number]   Height:       63.0 in Accession #:    [id number]  Weight:       117.7  lb Date of Birth:  Jul 31, 1931   BSA:          1.544 m Patient Age:    28 years    BP:           178/57 mmHg Patient Gender: F           HR:           93 bpm. Exam Location:  Inpatient Procedure: 2D Echo Indications:    bacteremia  History:        Patient has no prior history of Echocardiogram examinations.  Sonographer:    Harvie Junior Referring Phys: [id number] [place]  1. Left ventricular ejection fraction, by estimation, is 60 to 65%. The left ventricle has normal function. The left ventricle has no regional wall motion abnormalities. Indeterminate diastolic filling due to E-A fusion.  2. Right ventricular systolic function is normal. The right ventricular size is normal.  3. No evidence of mitral valve regurgitation.  4. There is mild calcification of the aortic valve. Aortic valve regurgitation is not visualized.  5. The inferior vena cava is normal in size with greater than 50% respiratory variability, suggesting right atrial pressure of 3 mmHg. Comparison(s): No prior Echocardiogram. Conclusion(s)/Recommendation(s): Normal biventricular function without evidence of hemodynamically significant valvular heart disease. Windows challenging. No frank signs of valvular vegetations. If high concern, recommend a TEE. FINDINGS  Left Ventricle: Left ventricular ejection fraction, by estimation, is 60 to 65%. The left ventricle has normal function. The left ventricle has no regional wall motion abnormalities. The left ventricular internal cavity size was normal in size. There is  no left ventricular hypertrophy. Indeterminate diastolic filling due to E-A fusion. Right Ventricle: The right ventricular size is normal. Right ventricular systolic function is normal. Left Atrium: Left atrial size was normal in size. Right Atrium: Right atrial size was normal in size. Pericardium: There is no evidence of pericardial effusion. Mitral Valve: No evidence of mitral valve regurgitation. Tricuspid Valve: Tricuspid  valve regurgitation is not demonstrated. Aortic Valve: There is mild calcification of the aortic valve. Aortic valve regurgitation is not visualized. Aortic valve mean gradient measures 2.5 mmHg. Aortic valve peak gradient measures 4.7 mmHg. Aortic valve area, by VTI measures 3.22 cm. Pulmonic Valve: Pulmonic valve regurgitation is not visualized. Aorta: The aortic root and ascending aorta are structurally normal, with no evidence of dilitation. Venous: The inferior vena cava is normal in size with greater than 50% respiratory variability, suggesting right atrial pressure of 3 mmHg. IAS/Shunts: No atrial level shunt detected by color flow Doppler.  LEFT VENTRICLE PLAX 2D LVIDd:         3.60 cm     Diastology LVIDs:         2.30 cm     LV e' medial:    6.96 cm/s LV PW:         0.90 cm     LV E/e' medial:  12.6 LV IVS:        0.90 cm     LV e' lateral:   12.10 cm/s LVOT diam:     1.90 cm     LV E/e' lateral: 7.2 LV SV:         54 LV SV Index:   35 LVOT Area:     2.84 cm  LV Volumes (MOD) LV vol d, MOD A2C: 59.4 ml LV vol d, MOD A4C: 77.8 ml LV vol s, MOD A2C: 19.3 ml LV vol s, MOD A4C: 31.1 ml LV SV MOD A2C:     40.1 ml LV SV MOD A4C:     77.8 ml LV SV MOD BP:      43.3 ml RIGHT VENTRICLE RV Basal diam:  2.40 cm RV Mid diam:    1.90 cm RV S prime:     17.40 cm/s TAPSE (M-mode): 1.6 cm LEFT ATRIUM             Index        RIGHT ATRIUM          Index LA diam:        2.90 cm 1.88 cm/m   RA Area:     7.82 cm LA Vol (A2C):   32.2 ml 20.86 ml/m  RA Volume:   14.40 ml 9.33 ml/m LA Vol (A4C):   21.3 ml 13.80 ml/m LA Biplane Vol: 26.6 ml 17.23 ml/m  AORTIC VALVE                    PULMONIC VALVE AV Area (Vmax):    2.71 cm     PV Vmax:       1.17 m/s AV Area (Vmean):   2.39 cm     PV Peak grad:  5.5 mmHg AV Area (VTI):     3.22 cm AV Vmax:           108.65 cm/s AV Vmean:          74.500 cm/s AV VTI:            0.168 m AV Peak Grad:      4.7 mmHg AV Mean Grad:      2.5 mmHg LVOT Vmax:         104.00 cm/s LVOT Vmean:         62.700 cm/s LVOT VTI:          0.191 m LVOT/AV VTI ratio: 1.14  AORTA Ao Root diam: 3.10 cm Ao Asc diam:  3.60 cm MITRAL VALVE MV Area (PHT): 5.13 cm     SHUNTS MV Decel Time: 148 msec     Systemic VTI:  0.19 m MV E velocity: 87.50 cm/s   Systemic Diam: 1.90 cm MV A velocity: 123.00 cm/s MV E/A ratio:  0.71 Landscape architect signed by Phineas Inches Signature Date/Time: 05/27/2022/12:29:31 PM    Final      Assessment/Plan: MRSA uti = continue on vancomycin  Right tibial lesion = along with tissue for path, please send aerobic/anaerobic culture, but suspect that malignancy is higher on the differential  [facility] Pager: [phone]  05/28/2022, 5:38 PM

## 2022-05-28 NOTE — Progress Notes (Signed)
Referring Physician(s): Nettey,R  Supervising Physician: Markus Daft  Patient Status:  [facility] - In-pt  Chief Complaint: Right leg pain, bony lesions, bladder cancer   Subjective: Pt known to IR team from port a cath placement on 11/15/21. She is an 89 yo female with PMH of GERD, HTN , hiatal hernia, anemia, diverticular disease, and bladder cancer, s/p robotic cystectomy/ileal conduit diversion 05/09/22. She was admitted to [facility] on 05/23/22 with AMS, sepsis secondary to UTI (e coli/ MRSA), hypercalcemia. Pt also has casted left leg secondary to left tib/fib fx. Pt has undergone RLE imaging due to pain with latest MRI revealing:  1. Widespread osseous abnormalities throughout both lower legs. This pattern can be seen with metastatic disease and multiple myeloma. As suggested on CT, given previous soft tissue findings and admission for sepsis, findings could reflect multifocal osteomyelitis. Correlate clinically and consider further systemic assessment with whole-body bone scan or PET-CT. 2. No focal soft tissue masses or fluid collections are identified in the right lower leg aside from those associated with the permeative bone lesions. 3. More extensive soft tissue abnormalities throughout the visualized left lower leg with soft tissue emphysema and edema.   She is afebrile, WBC 11.3, hgb 7.9, plts 265k, creat nl, PT 15.8/INR 1.3; request now received from primary team for image guided biopsy of the rt tibial lesion for further evaluation. Pt is on IV vancomycin. She denies fever,HA, CP, cough , abd/back pain,N/V or bleeding. She does have some dyspnea with exertion, rt leg pain.        Past Medical History:  Diagnosis Date   Bladder cancer ([place])    Diverticulitis    Fracture of right fibula 10/10/2013   closed   GERD (gastroesophageal reflux disease)    History of hiatal hernia    Hypertension    Past Surgical History:  Procedure Laterality Date   ABDOMINAL HYSTERECTOMY   1978   APPENDECTOMY     AGE 69   CYSTOSCOPY W/ URETERAL STENT PLACEMENT Right 10/09/2021   Procedure: POSSIBLE BILATERAL  RETROGRADE POSSIBLE RIGHT Melene Muller STENT;  Surgeon: Lucas Mallow, MD;  Location: [facility];  Service: Urology;  Laterality: Right;   CYSTOSCOPY WITH INJECTION N/A 05/09/2022   Procedure: CYSTOSCOPY WITH INJECTION OF INDOCYANINE GREEN DYE;  Surgeon: [name], MD;  Location: [facility];  Service: Urology;  Laterality: N/A;   DIAGNOSTIC LAPAROSCOPY  1974   IR IMAGING GUIDED PORT INSERTION  11/15/2021   LYMPH NODE DISSECTION Bilateral 05/09/2022   Procedure: PELVIC LYMPH NODE DISSECTION;  Surgeon: [name], MD;  Location: [facility];  Service: Urology;  Laterality: Bilateral;   ROBOT ASSISTED LAPAROSCOPIC COMPLETE CYSTECT ILEAL CONDUIT N/A 05/09/2022   Procedure: XI ROBOTIC ASSISTED LAPAROSCOPIC COMPLETE CYSTECT ILEAL CONDUIT;  Surgeon: [name], MD;  Location: [facility];  Service: Urology;  Laterality: N/A;  [place]   to lift uterus prior to getting pregnant   TRANSURETHRAL RESECTION OF BLADDER TUMOR Bilateral 10/09/2021   Procedure: TRANSURETHRAL RESECTION OF BLADDER TUMOR (TURBT) POSSIBLE BILATERAL RETROGRADE POSSIBLE RIGHT URETERAL STENT;  Surgeon: Lucas Mallow, MD;  Location: [facility];  Service: Urology;  Laterality: Bilateral;     Allergies: Patient has no known allergies.  Medications: Prior to Admission medications   Medication Sig Start Date End Date Taking? Authorizing Provider  Cholecalciferol (VITAMIN D3 PO) Take 1 tablet by mouth in the morning.   Yes [provider]  KLOR-CON 10 10 MEQ tablet Take  20 mEq by mouth in the morning and at bedtime.   Yes [provider]  Multiple Minerals-Vitamins (BONE DENSITY BUILDER PO) Take 1 tablet by mouth See admin instructions. Metagenics Bone Builder Forte tablets- Take 1 tablet by mouth in the morning with breakfast   Yes [provider]  omeprazole  (PRILOSEC) 20 MG capsule Take 20 mg by mouth daily before breakfast.   Yes [provider]  oxyCODONE-acetaminophen (PERCOCET) 5-325 MG tablet Take 1 tablet by mouth every 6 (six) hours as needed for severe pain or moderate pain (post-operatively). 05/17/22  Yes [name], MD  vitamin B-12 (CYANOCOBALAMIN) 500 MCG tablet Take 500 mcg by mouth in the morning.   Yes [provider]  potassium chloride (KLOR-CON M) 10 MEQ tablet Take 2 tablets (20 mEq total) by mouth 2 (two) times daily for 7 days. Patient not taking: Reported on 05/24/2022 04/29/22 05/24/22  Molpus, Jenny Reichmann, MD     Vital Signs: BP (!) 149/44 (BP Location: Left Arm)   Pulse 99   Temp 97.6 F (36.4 C) (Axillary)   Resp (!) 21   Ht '5\' 3"'$  (1.6 m)   Wt 119 lb 0.8 oz (54 kg)   SpO2 100%   BMI 21.09 kg/m   Physical Exam awake, oriented to birthdate but not year, location, day of week, sl dysarthria; chest- CTA bilat; intact rt chest wall PAC; heart- RRR;  abd- soft,+BS, intact ostomy with yellow urine in bag; left lower leg in cast; some right ant tibial tenderness to palpation/nodule present   Imaging: MR TIBIA FIBULA RIGHT W WO CONTRAST  Result Date: 05/28/2022 CLINICAL DATA:  Bone mass or bone pain, leg, aggressive features on x-ray. Primary tip tibial lesion on CT. History of bladder cancer. EXAM: MRI OF LOWER RIGHT EXTREMITY WITHOUT AND WITH CONTRAST TECHNIQUE: Multiplanar, multisequence MR imaging of the right lower leg was performed both before and after administration of intravenous contrast. CONTRAST:  35m GADAVIST GADOBUTROL 1 MMOL/ML IV SOLN COMPARISON:  Radiographs 05/24/2022.  CT 05/25/2022. FINDINGS: Bones/Joint/Cartilage Both lower legs are included on the coronal images. There are extensive widespread osseous abnormalities and both lower legs. The lesion of concern in the mid right tibial diaphysis measures approximately 4.9 cm in length and is associated with permeative destruction of the cortex  anteriorly, periosteal reaction and an enhancing soft tissue mass measuring approximately 2.8 x 2.5 cm transverse on image 39/20. Similar appearing permeative lesion involving the distal right fibular diaphysis extends up to 4.1 cm in length. There are numerous additional smaller lesions throughout the right tibia as well as the visualized right tarsal bones. In the left lower leg, the marrow abnormalities are more diffuse, with near complete involvement of the tibia and a large distal fibular lesion. No large joint effusions or definite pathologic fractures are identified. Ligaments No significant ligamentous abnormalities on large field-of-view imaging. Muscles and Tendons Multifocal periosteal reaction associated with the osseous lesions described above. No intramuscular masses are identified in the right lower leg. Incidental imaging of the left lower leg demonstrates extensive edema throughout the musculature. Soft tissues The soft tissue gas around the right knee seen on the recent CT is not clearly visualized. No focal soft tissue masses or fluid collections are identified aside from those associated with the permeative bone lesions. As above, there is periosteal edema and enhancement as well a small amount of subcutaneous edema in the mid right lower leg. In the visualized left lower leg, there is more diffuse soft tissue edema  as well as apparent soft tissue emphysema medially in the proximal lower leg. IMPRESSION: 1. Widespread osseous abnormalities throughout both lower legs. This pattern can be seen with metastatic disease and multiple myeloma. As suggested on CT, given previous soft tissue findings and admission for sepsis, findings could reflect multifocal osteomyelitis. Correlate clinically and consider further systemic assessment with whole-body bone scan or PET-CT. 2. No focal soft tissue masses or fluid collections are identified in the right lower leg aside from those associated with the  permeative bone lesions. 3. More extensive soft tissue abnormalities throughout the visualized left lower leg with soft tissue emphysema and edema. Electronically Signed   By: Richardean Sale M.D.   On: 05/28/2022 08:22   DG Abd Portable 1V  Result Date: 05/27/2022 CLINICAL DATA:  Abdominal tenderness, history of ileal conduit urinary diversion EXAM: PORTABLE ABDOMEN - 1 VIEW COMPARISON:  None Available. FINDINGS: Nonobstructive pattern of bowel gas. Ureteral stent catheters in expected position for ileal conduit urinary diversion. No free air in the abdomen on supine radiographs. IMPRESSION: Nonobstructive pattern of bowel gas. Ureteral stent catheters in expected position for ileal conduit urinary diversion. Electronically Signed   By: Delanna Ahmadi M.D.   On: 05/27/2022 16:18   ECHOCARDIOGRAM COMPLETE  Result Date: 05/27/2022    ECHOCARDIOGRAM REPORT   Patient Name:   Brandy Wheeler Date of Exam: 05/27/2022 Medical Rec #:  [id number]   Height:       63.0 in Accession #:    [id number]  Weight:       117.7 lb Date of Birth:  04-03-1932   BSA:          1.544 m Patient Age:    17 years    BP:           178/57 mmHg Patient Gender: F           HR:           93 bpm. Exam Location:  Inpatient Procedure: 2D Echo Indications:    bacteremia  History:        Patient has no prior history of Echocardiogram examinations.  Sonographer:    Harvie Junior Referring Phys: [id number] [place]  1. Left ventricular ejection fraction, by estimation, is 60 to 65%. The left ventricle has normal function. The left ventricle has no regional wall motion abnormalities. Indeterminate diastolic filling due to E-A fusion.  2. Right ventricular systolic function is normal. The right ventricular size is normal.  3. No evidence of mitral valve regurgitation.  4. There is mild calcification of the aortic valve. Aortic valve regurgitation is not visualized.  5. The inferior vena cava is normal in size with greater than 50% respiratory  variability, suggesting right atrial pressure of 3 mmHg. Comparison(s): No prior Echocardiogram. Conclusion(s)/Recommendation(s): Normal biventricular function without evidence of hemodynamically significant valvular heart disease. Windows challenging. No frank signs of valvular vegetations. If high concern, recommend a TEE. FINDINGS  Left Ventricle: Left ventricular ejection fraction, by estimation, is 60 to 65%. The left ventricle has normal function. The left ventricle has no regional wall motion abnormalities. The left ventricular internal cavity size was normal in size. There is  no left ventricular hypertrophy. Indeterminate diastolic filling due to E-A fusion. Right Ventricle: The right ventricular size is normal. Right ventricular systolic function is normal. Left Atrium: Left atrial size was normal in size. Right Atrium: Right atrial size was normal in size. Pericardium: There is no evidence of pericardial effusion. Mitral Valve: No  evidence of mitral valve regurgitation. Tricuspid Valve: Tricuspid valve regurgitation is not demonstrated. Aortic Valve: There is mild calcification of the aortic valve. Aortic valve regurgitation is not visualized. Aortic valve mean gradient measures 2.5 mmHg. Aortic valve peak gradient measures 4.7 mmHg. Aortic valve area, by VTI measures 3.22 cm. Pulmonic Valve: Pulmonic valve regurgitation is not visualized. Aorta: The aortic root and ascending aorta are structurally normal, with no evidence of dilitation. Venous: The inferior vena cava is normal in size with greater than 50% respiratory variability, suggesting right atrial pressure of 3 mmHg. IAS/Shunts: No atrial level shunt detected by color flow Doppler.  LEFT VENTRICLE PLAX 2D LVIDd:         3.60 cm     Diastology LVIDs:         2.30 cm     LV e' medial:    6.96 cm/s LV PW:         0.90 cm     LV E/e' medial:  12.6 LV IVS:        0.90 cm     LV e' lateral:   12.10 cm/s LVOT diam:     1.90 cm     LV E/e' lateral: 7.2  LV SV:         54 LV SV Index:   35 LVOT Area:     2.84 cm  LV Volumes (MOD) LV vol d, MOD A2C: 59.4 ml LV vol d, MOD A4C: 77.8 ml LV vol s, MOD A2C: 19.3 ml LV vol s, MOD A4C: 31.1 ml LV SV MOD A2C:     40.1 ml LV SV MOD A4C:     77.8 ml LV SV MOD BP:      43.3 ml RIGHT VENTRICLE RV Basal diam:  2.40 cm RV Mid diam:    1.90 cm RV S prime:     17.40 cm/s TAPSE (M-mode): 1.6 cm LEFT ATRIUM             Index        RIGHT ATRIUM          Index LA diam:        2.90 cm 1.88 cm/m   RA Area:     7.82 cm LA Vol (A2C):   32.2 ml 20.86 ml/m  RA Volume:   14.40 ml 9.33 ml/m LA Vol (A4C):   21.3 ml 13.80 ml/m LA Biplane Vol: 26.6 ml 17.23 ml/m  AORTIC VALVE                    PULMONIC VALVE AV Area (Vmax):    2.71 cm     PV Vmax:       1.17 m/s AV Area (Vmean):   2.39 cm     PV Peak grad:  5.5 mmHg AV Area (VTI):     3.22 cm AV Vmax:           108.65 cm/s AV Vmean:          74.500 cm/s AV VTI:            0.168 m AV Peak Grad:      4.7 mmHg AV Mean Grad:      2.5 mmHg LVOT Vmax:         104.00 cm/s LVOT Vmean:        62.700 cm/s LVOT VTI:          0.191 m LVOT/AV VTI ratio: 1.14  AORTA Ao Root diam: 3.10 cm Ao  Asc diam:  3.60 cm MITRAL VALVE MV Area (PHT): 5.13 cm     SHUNTS MV Decel Time: 148 msec     Systemic VTI:  0.19 m MV E velocity: 87.50 cm/s   Systemic Diam: 1.90 cm MV A velocity: 123.00 cm/s MV E/A ratio:  0.71 Mary Scientist, physiological signed by Phineas Inches Signature Date/Time: 05/27/2022/12:29:31 PM    Final    CT TIBIA FIBULA RIGHT WO CONTRAST  Result Date: 05/26/2022 CLINICAL DATA:  Permeative lesion in the mid tibia. History of bladder cancer. EXAM: CT OF THE LOWER RIGHT EXTREMITY WITHOUT CONTRAST TECHNIQUE: Multidetector CT imaging of the right lower extremity was performed according to the standard protocol. RADIATION DOSE REDUCTION: This exam was performed according to the departmental dose-optimization program which includes automated exposure control, adjustment of the mA and/or kV according to  patient size and/or use of iterative reconstruction technique. COMPARISON:  Radiographs 05/24/2022 FINDINGS: Bones/Joint/Cartilage CT confirms a poorly defined permeative lesion in the mid to distal diaphysis of the tibia anteriorly with a somewhat moth-eaten appearance of the cortex, substantial irregular periosteal reaction, and endosteal indistinctness in the region of concern. This extends over a an 11.9 cm excursion and primarily involves the anterior, medial, and lateral cortex of the tibial shaft. Hypodensity in the immediately adjacent soft tissues likely from edema, but without a large soft tissue mass component. In addition, there is circumferential periosteal reaction in the distal fibular metadiaphysis. The patient's original fracture in this vicinity was from 2015 and this degree of periosteal reaction and mildly moth-eaten cortex would not be a typical postoperative finding from 2015. This is probably a similar process to that involving the tibia and is thought to be acute. Bony demineralization underlying the lateral talar process as on image 62 series 7 and image 283 series 6. Ligaments Suboptimally assessed by CT. Muscles and Tendons Gas tracks along fascia planes adjacent to some of the muscle groups particularly in the knee region. Soft tissues There is substantial gas tracking along fascia planes in the right knee (and in the left knee but only seen in a very limited manner on the left). On prior CT abdomen the patient has subcutaneous emphysema in the chest, anterior and lateral abdominal wall, along the labia, and in the anterior thighs, and presumably the gas visible on today's exam represents that process tracking down. IMPRESSION: 1. Permeative lesion in the mid to distal diaphysis of the right tibia with substantial periosteal reaction and endosteal indistinctness. Similar periosteal reaction and milder permeative moth-eaten lesion in the distal fibular metadiaphysis. Localized edema/fluid  along the regions of maximum involvement but no obvious soft tissue mass. 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 2. Bony demineralization underlying the lateral talar process. This could also be an indicator of early osteomyelitis. 3. Gas tracking along fascia planes and subcutaneous tissues in the knee regions bilaterally, likely representing the inferior extent of the subcutaneous emphysema of the chest, abdomen, and thighs shown on CT abdomen imaging. Electronically Signed   By: Van Clines M.D.   On: 05/26/2022 13:01   DG CHEST PORT 1 VIEW  Result Date: 05/24/2022 CLINICAL DATA:  Tachypnea, history of bladder carcinoma EXAM: PORTABLE CHEST 1 VIEW COMPARISON:  05/23/2022 FINDINGS: Cardiac shadow is stable. Right chest wall port is noted in satisfactory position. Aortic calcifications are seen. Lungs are well aerated bilaterally. No focal infiltrate or sizable effusion is  noted. Persistent subcutaneous emphysema is noted related to prior laparoscopy IMPRESSION: No acute abnormality noted. Electronically Signed   By: Inez Catalina M.D.   On: 05/24/2022 17:13    Labs:  CBC: Recent Labs    05/25/22 0443 05/26/22 0500 05/26/22 1109 05/27/22 0805 05/28/22 0555  WBC 10.1 9.4  --  11.0* 11.3*  HGB 7.8* 7.1* 7.8* 8.0* 7.9*  HCT 25.9* 24.3* 26.7* 27.1* 25.6*  PLT 316 266  --  296 265    COAGS: Recent Labs    05/23/22 1600  INR 1.3*    BMP: Recent Labs    05/26/22 0500 05/26/22 1109 05/26/22 1743 05/27/22 0449 05/28/22 0555  NA 148* 147* 143 142 139  K 3.3* 4.3  --  3.8 3.3*  CL 126* 120*  --  116* 113*  CO2 18* 21*  --  21* 19*  GLUCOSE 93 129*  --  164* 157*  BUN 30* 36*  --  27* 23  CALCIUM 7.6* 9.1  --  8.9 8.3*  CREATININE 0.72 0.89  --  0.68 0.68  GFRNONAA >60 >60  --  >60 >60    LIVER FUNCTION TESTS: Recent Labs     05/23/22 1600 05/24/22 0724 05/25/22 0443 05/26/22 0500 05/27/22 0449  BILITOT 0.7 0.6  --  0.2* 0.3  AST 24 21  --  16 20  ALT 13 10  --  8 10  ALKPHOS 187* 159*  --  111 142*  PROT 5.9* 5.6*  --  3.8* 4.9*  ALBUMIN 2.4* 2.1* 1.9* <1.5* 1.7*    Assessment and Plan: Pt known to IR team from port a cath placement on 11/15/21. She is an 89 yo female with PMH of GERD, HTN , hiatal hernia, anemia, diverticular disease, and bladder cancer, s/p robotic cystectomy/ileal conduit diversion 05/09/22. She was admitted to [facility] on 05/23/22 with AMS, sepsis secondary to UTI (e coli/ MRSA), hypercalcemia. Pt also has casted left leg secondary to left tib/fib fx. Pt has undergone RLE imaging due to pain with latest MRI revealing:  1. Widespread osseous abnormalities throughout both lower legs. This pattern can be seen with metastatic disease and multiple myeloma. As suggested on CT, given previous soft tissue findings and admission for sepsis, findings could reflect multifocal osteomyelitis. Correlate clinically and consider further systemic assessment with whole-body bone scan or PET-CT. 2. No focal soft tissue masses or fluid collections are identified in the right lower leg aside from those associated with the permeative bone lesions. 3. More extensive soft tissue abnormalities throughout the visualized left lower leg with soft tissue emphysema and edema.   She is afebrile, WBC 11.3, hgb 7.9, plts 265k, creat nl, PT 15.8/INR 1.3; request now received from primary team for image guided biopsy of the rt tibial lesion for further evaluation. Pt is on IV vancomycin. Imaging studies have been reviewed by Dr. Anselm Pancoast. Risks and benefits of procedure was discussed with the patient/daughter [facility]  including, but not limited to bleeding, infection, damage to adjacent structures or low yield requiring additional tests.  All of the questions were answered and there is agreement to proceed.  Consent signed and  in chart. Procedure tent planned for 2/6.     Electronically Signed: D. Rowe Robert, PA-C 05/28/2022, 2:43 PM   I spent a total of 25 minutes  at the the patient's bedside AND on the patient's hospital floor or unit, greater than 50% of which was counseling/coordinating care for image guided biopsy of right tibial lesion  Patient ID: Leza Apsey, female   DOB: 1931/05/04, 89 y.o.   MRN: [id number]

## 2022-05-28 NOTE — TOC Progression Note (Addendum)
Transition of Care [facility]) - Progression Note    Patient Details  Name: Brandy Wheeler MRN: [id number] Date of Birth: [birth year]  Transition of Care [facility]) CM/SW [place], RN Phone Number: 05/28/2022, 3:23 PM  Clinical Narrative:   Per chart review patient has hx of bladder cancer, admitted to ICU on 2/1, plan to d/c to SNF when medically stable. Awaiting PT eval.   TOC will continue to follow for d/c needs.    Expected Discharge Plan: [place] Barriers to Discharge: Continued Medical Work up  Expected Discharge Plan and Services   Discharge Planning Services: CM Consult   Living arrangements for the past 2 months: [place]                                       Social Determinants of Health (SDOH) Interventions SDOH Screenings   Food Insecurity: No Food Insecurity (05/08/2022)  Housing: Low Risk  (05/08/2022)  Transportation Needs: No Transportation Needs (05/08/2022)  Utilities: Not At Risk (05/08/2022)  Tobacco Use: Low Risk  (05/10/2022)    Readmission Risk Interventions     No data to display

## 2022-05-28 NOTE — Progress Notes (Signed)
PROGRESS NOTE    Brandy Wheeler  ENI:[id number] DOB: [birth year] DOA: 05/23/2022 PCP: Pcp, No   Brief Narrative: Brandy Wheeler is a 89 y.o. female with a history of bladder cancer s/p robotic cystectomy and ileal conduit diversion, GERD and anemia. Patient presented secondary to altered mental status and found to have evidence of sepsis secondary to UTI. Empiric antibiotics started. Hospitalization complicated by persistent altered mental status and severe hypercalcemia.   Assessment and Plan:  Acute metabolic encephalopathy Present on admission. With hypercalcemia improved, it seems mental status is more likely related to infection. CT head unremarkable for acute process. Patient is showing improvement after adjusting antibiotics. SLP has advanced patient to a dysphagia 1 diet. -MRI brain today  Severe sepsis Present/criteria met on admission. Secondary to UTI. Blood and urine cultures obtained on admission. Procalcitonin negative. Urine culture is significant for MRSA and streptococcus mitas/oralis. Blood cultures are no growth to date.  UTI Urinalysis suggests UTI. Complicated by recent urologic procedure. Patient started empirically on Cefepime. Urine culture culture significant for MRSA and streptococcus mitis/oralis -Continue Vancomycin  Hypercalcemia Appears to be more likely related to underlying cancer. Patient is also on vitamin D supplementation. Calcium of 12.3 on admission. 1,25- and 25-hydroxy vitamin D levels of 44.3 and 91.4 respectively (wnl). PTH of 6 (low). Zometa and calcitonin given. Calcium improved. Corrected calcium of 10.1. -D5 1/2 NS -Daily C/BMP -PTHrP pending  Right ankle/leg pain CT tib/fib suggests early osteomyelitis. CT report also suggests underlying metastatic lesion. Discussed with on-call [place] surgery, Dr. Grandville Silos, who recommended MRI vs CT guided biopsy. Discussed with IR who recommended MRI followed by CT-guided biopsy. ID consulted.  MRI tib/fib consistent with possible metastatic lesions vs multifocal osteomyelitis. Transthoracic Echocardiogram significant for no evidence of vegetation. -IR consult for bone biopsy -ID recommendations: follow-up blood cultures. Pending today  Elevated creatinine, resolved Mild elevation with associated elevated BUN of 50. Patient with mild hydronephrosis on admission. Good urine output noted. BUN and creatinine improved. -Strict in/out  Leukocytosis Initial improvement, now slightly elevated. No associated fevers -CBC in AM  Hypernatremia, resolved Secondary to poor oral intake and isotonic saline for treatment of hypercalcemia. Resolved with D5 water.  Abdominal tenderness Unclear etiology. No associated emesis. Patient with documented bowel movements. Abdominal x-ray unremarkable. Tenderness appears to be resolved today.  Hyperkalemia Mild. Resolved.  Hypokalemia Mild. Resolved with potassium supplementation  GERD -Resume Protonix  Anemia Appears to be post-operative. Hemoglobin of 9.7 on admission with decrease. Stablized. No obvious evidence of bleeding. Likely related to dilution effect. -Transfuse for hemoglobin <7  Left tib/fib fracture Patient follows with orthopedic surgery and has a casted left leg. She is non-weight bearing of that leg -PT/OT eval  Oligometastatic bladder cancer Patient follows with oncology and urology. S/p robotic cystectomy with node dissection and conduit diversion.  Severe malnutrition Dietitian recommendations: Prosource Plus PO TID, each provides 100 kcals and 15g protein  Magic cup TID with meals, each supplement provides 290 kcal and 9 grams of protein  Multivitamin with minerals daily   DVT prophylaxis: Heparin subq Code Status:   Code Status: Full Code Family Communication: None at bedside. Daughter/son-in-law via telephone (2/5) Disposition Plan: Discharge back to SNF likely in 2-3 days pending improvement of mental status,  improvement of hypercalcemia, transition to oral antibiotics if able   Consultants:  Urology Infectious disease  Procedures:  None  Antimicrobials: Cefepime  Ceftriaxone Vancomycin   Subjective: Patient reports no concerns this morning.  Objective: BP (!) 148/69 (BP  Location: Left Arm)   Pulse (!) 111   Temp (!) 97.4 F (36.3 C) (Axillary)   Resp 19   Ht '5\' 3"'$  (1.6 m)   Wt 54 kg   SpO2 100%   BMI 21.09 kg/m   Examination:  General exam: Appears calm and comfortable Respiratory system: Clear to auscultation. Respiratory effort normal. Cardiovascular system: S1 & S2 heard, RRR. Gastrointestinal system: Abdomen is nondistended, soft and nontender. Normal bowel sounds heard. Central nervous system: Alert and oriented to person and place. Speech still slightly slurred.    Data Reviewed: I have personally reviewed following labs and imaging studies  CBC Lab Results  Component Value Date   WBC 11.3 (H) 05/28/2022   RBC 2.87 (L) 05/28/2022   HGB 7.9 (L) 05/28/2022   HCT 25.6 (L) 05/28/2022   MCV 89.2 05/28/2022   MCH 27.5 05/28/2022   PLT 265 05/28/2022   MCHC 30.9 05/28/2022   RDW 14.8 05/28/2022   LYMPHSABS 2.6 05/23/2022   MONOABS 1.1 (H) 05/23/2022   EOSABS 0.3 05/23/2022   BASOSABS 0.1 81/85/6314     Last metabolic panel Lab Results  Component Value Date   NA 139 05/28/2022   K 3.3 (L) 05/28/2022   CL 113 (H) 05/28/2022   CO2 19 (L) 05/28/2022   BUN 23 05/28/2022   CREATININE 0.68 05/28/2022   GLUCOSE 157 (H) 05/28/2022   GFRNONAA >60 05/28/2022   CALCIUM 8.3 (L) 05/28/2022   PROT 4.9 (L) 05/27/2022   ALBUMIN 1.7 (L) 05/27/2022   BILITOT 0.3 05/27/2022   ALKPHOS 142 (H) 05/27/2022   AST 20 05/27/2022   ALT 10 05/27/2022   ANIONGAP 7 05/28/2022    GFR: Estimated Creatinine Clearance: 40.2 mL/min (by C-G formula based on SCr of 0.68 mg/dL).  Recent Results (from the past 240 hour(s))  Urine Culture (for pregnant, neutropenic or  urologic patients or patients with an indwelling urinary catheter)     Status: Abnormal   Collection Time: 05/23/22  3:50 PM   Specimen: Urine, Clean Catch  Result Value Ref Range Status   Specimen Description   Final    URINE, CLEAN CATCH Performed at [facility], [place] [address] [place], [place] [address]    Special Requests   Final    NONE Performed at [facility], [place] [address] [place], [place] [address]    Culture (A)  Final    >=100,000 COLONIES/mL METHICILLIN RESISTANT STAPHYLOCOCCUS AUREUS 60,000 COLONIES/mL STREPTOCOCCUS MITIS/ORALIS    Report Status 05/26/2022 FINAL  Final   Organism ID, Bacteria METHICILLIN RESISTANT STAPHYLOCOCCUS AUREUS (A)  Final      Susceptibility   Methicillin resistant staphylococcus aureus - MIC*    CIPROFLOXACIN >=8 RESISTANT Resistant     GENTAMICIN <=0.5 SENSITIVE Sensitive     NITROFURANTOIN <=16 SENSITIVE Sensitive     OXACILLIN >=4 RESISTANT Resistant     TETRACYCLINE <=1 SENSITIVE Sensitive     VANCOMYCIN 1 SENSITIVE Sensitive     TRIMETH/SULFA <=10 SENSITIVE Sensitive     CLINDAMYCIN <=0.25 SENSITIVE Sensitive     RIFAMPIN <=0.5 SENSITIVE Sensitive     Inducible Clindamycin NEGATIVE Sensitive     * >=100,000 COLONIES/mL METHICILLIN RESISTANT STAPHYLOCOCCUS AUREUS  Culture, blood (Routine X 2) w Reflex to ID Panel     Status: None   Collection Time: 05/23/22  4:00 PM   Specimen: BLOOD  Result Value Ref Range Status   Specimen Description   Final    BLOOD PORTA CATH Performed  at [facility], [place] [address] [place], [place] [address]    Special Requests   Final    BOTTLES DRAWN AEROBIC AND ANAEROBIC Blood Culture results may not be optimal due to an inadequate volume of blood received in culture bottles Performed at [place] [address] [place], [place] [address]    Culture   Final    NO GROWTH 5 DAYS Performed at [facility],  [place] [address], [place], [place] [address]    Report Status 05/28/2022 FINAL  Final  Resp panel by RT-PCR (RSV, Flu A&B, Covid) Anterior Nasal Swab     Status: None   Collection Time: 05/23/22  4:02 PM   Specimen: Anterior Nasal Swab  Result Value Ref Range Status   SARS Coronavirus 2 by RT PCR NEGATIVE NEGATIVE Final    Comment: (NOTE) SARS-CoV-2 target nucleic acids are NOT DETECTED.  The SARS-CoV-2 RNA is generally detectable in upper respiratory specimens during the acute phase of infection. The lowest concentration of SARS-CoV-2 viral copies this assay can detect is 138 copies/mL. A negative result does not preclude SARS-Cov-2 infection and should not be used as the sole basis for treatment or other patient management decisions. A negative result may occur with  improper specimen collection/handling, submission of specimen other than nasopharyngeal swab, presence of viral mutation(s) within the areas targeted by this assay, and inadequate number of viral copies(<138 copies/mL). A negative result must be combined with clinical observations, patient history, and epidemiological information. The expected result is Negative.  Fact Sheet for Patients:  EntrepreneurPulse.com.au  Fact Sheet for Healthcare Providers:  IncredibleEmployment.be  This test is no t yet approved or cleared by the [place] FDA and  has been authorized for detection and/or diagnosis of SARS-CoV-2 by FDA under an Emergency Use Authorization (EUA). This EUA will remain  in effect (meaning this test can be used) for the duration of the COVID-19 declaration under Section 564(b)(1) of the Act, 21 U.S.C.section 360bbb-3(b)(1), unless the authorization is terminated  or revoked sooner.       Influenza A by PCR NEGATIVE NEGATIVE Final   Influenza B by PCR NEGATIVE NEGATIVE Final    Comment: (NOTE) The Xpert Xpress SARS-CoV-2/FLU/RSV plus assay is intended as an aid in the  diagnosis of influenza from Nasopharyngeal swab specimens and should not be used as a sole basis for treatment. Nasal washings and aspirates are unacceptable for Xpert Xpress SARS-CoV-2/FLU/RSV testing.  Fact Sheet for Patients: EntrepreneurPulse.com.au  Fact Sheet for Healthcare Providers: IncredibleEmployment.be  This test is not yet approved or cleared by the [place] FDA and has been authorized for detection and/or diagnosis of SARS-CoV-2 by FDA under an Emergency Use Authorization (EUA). This EUA will remain in effect (meaning this test can be used) for the duration of the COVID-19 declaration under Section 564(b)(1) of the Act, 21 U.S.C. section 360bbb-3(b)(1), unless the authorization is terminated or revoked.     Resp Syncytial Virus by PCR NEGATIVE NEGATIVE Final    Comment: (NOTE) Fact Sheet for Patients: EntrepreneurPulse.com.au  Fact Sheet for Healthcare Providers: IncredibleEmployment.be  This test is not yet approved or cleared by the [place] FDA and has been authorized for detection and/or diagnosis of SARS-CoV-2 by FDA under an Emergency Use Authorization (EUA). This EUA will remain in effect (meaning this test can be used) for the duration of the COVID-19 declaration under Section 564(b)(1) of the Act, 21 U.S.C. section 360bbb-3(b)(1), unless the authorization is terminated or  revoked.  Performed at [facility], [place] [address] [place], [place] [address]   Culture, blood (Routine X 2) w Reflex to ID Panel     Status: None   Collection Time: 05/23/22  9:47 PM   Specimen: BLOOD  Result Value Ref Range Status   Specimen Description   Final    BLOOD BLOOD RIGHT HAND Performed at [facility] [address] [place], [place] [address]    Special Requests   Final    BOTTLES DRAWN AEROBIC ONLY Blood Culture results may not be optimal due to  an inadequate volume of blood received in culture bottles Performed at [facility] [address], [place], [place] [address]    Culture   Final    NO GROWTH 5 DAYS Performed at [facility], [place] [address], [place], [place] [address]    Report Status 05/28/2022 FINAL  Final  Respiratory (~20 pathogens) panel by PCR     Status: None   Collection Time: 05/24/22  6:39 PM   Specimen: Nasopharyngeal Swab; Respiratory  Result Value Ref Range Status   Adenovirus NOT DETECTED NOT DETECTED Final   Coronavirus 229E NOT DETECTED NOT DETECTED Final    Comment: (NOTE) The Coronavirus on the Respiratory Panel, DOES NOT test for the novel  Coronavirus (2019 nCoV)    Coronavirus HKU1 NOT DETECTED NOT DETECTED Final   Coronavirus NL63 NOT DETECTED NOT DETECTED Final   Coronavirus OC43 NOT DETECTED NOT DETECTED Final   Metapneumovirus NOT DETECTED NOT DETECTED Final   Rhinovirus / Enterovirus NOT DETECTED NOT DETECTED Final   Influenza A NOT DETECTED NOT DETECTED Final   Influenza B NOT DETECTED NOT DETECTED Final   Parainfluenza Virus 1 NOT DETECTED NOT DETECTED Final   Parainfluenza Virus 2 NOT DETECTED NOT DETECTED Final   Parainfluenza Virus 3 NOT DETECTED NOT DETECTED Final   Parainfluenza Virus 4 NOT DETECTED NOT DETECTED Final   Respiratory Syncytial Virus NOT DETECTED NOT DETECTED Final   Bordetella pertussis NOT DETECTED NOT DETECTED Final   Bordetella Parapertussis NOT DETECTED NOT DETECTED Final   Chlamydophila pneumoniae NOT DETECTED NOT DETECTED Final   Mycoplasma pneumoniae NOT DETECTED NOT DETECTED Final    Comment: Performed at [facility], [place] [address], [place],  [address]      Radiology Studies: MR TIBIA FIBULA RIGHT W WO CONTRAST  Result Date: 05/28/2022 CLINICAL DATA:  Bone mass or bone pain, leg, aggressive features on x-ray. Primary tip tibial lesion on CT. History of bladder cancer. EXAM: MRI OF LOWER RIGHT EXTREMITY WITHOUT AND  WITH CONTRAST TECHNIQUE: Multiplanar, multisequence MR imaging of the right lower leg was performed both before and after administration of intravenous contrast. CONTRAST:  51m GADAVIST GADOBUTROL 1 MMOL/ML IV SOLN COMPARISON:  Radiographs 05/24/2022.  CT 05/25/2022. FINDINGS: Bones/Joint/Cartilage Both lower legs are included on the coronal images. There are extensive widespread osseous abnormalities and both lower legs. The lesion of concern in the mid right tibial diaphysis measures approximately 4.9 cm in length and is associated with permeative destruction of the cortex anteriorly, periosteal reaction and an enhancing soft tissue mass measuring approximately 2.8 x 2.5 cm transverse on image 39/20. Similar appearing permeative lesion involving the distal right fibular diaphysis extends up to 4.1 cm in length. There are numerous additional smaller lesions throughout the right tibia as well as the visualized right tarsal bones. In the left lower leg, the marrow abnormalities are more diffuse, with near  complete involvement of the tibia and a large distal fibular lesion. No large joint effusions or definite pathologic fractures are identified. Ligaments No significant ligamentous abnormalities on large field-of-view imaging. Muscles and Tendons Multifocal periosteal reaction associated with the osseous lesions described above. No intramuscular masses are identified in the right lower leg. Incidental imaging of the left lower leg demonstrates extensive edema throughout the musculature. Soft tissues The soft tissue gas around the right knee seen on the recent CT is not clearly visualized. No focal soft tissue masses or fluid collections are identified aside from those associated with the permeative bone lesions. As above, there is periosteal edema and enhancement as well a small amount of subcutaneous edema in the mid right lower leg. In the visualized left lower leg, there is more diffuse soft tissue edema as well  as apparent soft tissue emphysema medially in the proximal lower leg. IMPRESSION: 1. Widespread osseous abnormalities throughout both lower legs. This pattern can be seen with metastatic disease and multiple myeloma. As suggested on CT, given previous soft tissue findings and admission for sepsis, findings could reflect multifocal osteomyelitis. Correlate clinically and consider further systemic assessment with whole-body bone scan or PET-CT. 2. No focal soft tissue masses or fluid collections are identified in the right lower leg aside from those associated with the permeative bone lesions. 3. More extensive soft tissue abnormalities throughout the visualized left lower leg with soft tissue emphysema and edema. Electronically Signed   By: Richardean Sale M.D.   On: 05/28/2022 08:22   DG Abd Portable 1V  Result Date: 05/27/2022 CLINICAL DATA:  Abdominal tenderness, history of ileal conduit urinary diversion EXAM: PORTABLE ABDOMEN - 1 VIEW COMPARISON:  None Available. FINDINGS: Nonobstructive pattern of bowel gas. Ureteral stent catheters in expected position for ileal conduit urinary diversion. No free air in the abdomen on supine radiographs. IMPRESSION: Nonobstructive pattern of bowel gas. Ureteral stent catheters in expected position for ileal conduit urinary diversion. Electronically Signed   By: Delanna Ahmadi M.D.   On: 05/27/2022 16:18   ECHOCARDIOGRAM COMPLETE  Result Date: 05/27/2022    ECHOCARDIOGRAM REPORT   Patient Name:   Henri Hsiung Date of Exam: 05/27/2022 Medical Rec #:  [id number]   Height:       63.0 in Accession #:    [id number]  Weight:       117.7 lb Date of Birth:  May 26, 1931   BSA:          1.544 m Patient Age:    29 years    BP:           178/57 mmHg Patient Gender: F           HR:           93 bpm. Exam Location:  Inpatient Procedure: 2D Echo Indications:    bacteremia  History:        Patient has no prior history of Echocardiogram examinations.  Sonographer:    Harvie Junior Referring  Phys: [id number] [place]  1. Left ventricular ejection fraction, by estimation, is 60 to 65%. The left ventricle has normal function. The left ventricle has no regional wall motion abnormalities. Indeterminate diastolic filling due to E-A fusion.  2. Right ventricular systolic function is normal. The right ventricular size is normal.  3. No evidence of mitral valve regurgitation.  4. There is mild calcification of the aortic valve. Aortic valve regurgitation is not visualized.  5. The inferior vena cava is normal in size  with greater than 50% respiratory variability, suggesting right atrial pressure of 3 mmHg. Comparison(s): No prior Echocardiogram. Conclusion(s)/Recommendation(s): Normal biventricular function without evidence of hemodynamically significant valvular heart disease. Windows challenging. No frank signs of valvular vegetations. If high concern, recommend a TEE. FINDINGS  Left Ventricle: Left ventricular ejection fraction, by estimation, is 60 to 65%. The left ventricle has normal function. The left ventricle has no regional wall motion abnormalities. The left ventricular internal cavity size was normal in size. There is  no left ventricular hypertrophy. Indeterminate diastolic filling due to E-A fusion. Right Ventricle: The right ventricular size is normal. Right ventricular systolic function is normal. Left Atrium: Left atrial size was normal in size. Right Atrium: Right atrial size was normal in size. Pericardium: There is no evidence of pericardial effusion. Mitral Valve: No evidence of mitral valve regurgitation. Tricuspid Valve: Tricuspid valve regurgitation is not demonstrated. Aortic Valve: There is mild calcification of the aortic valve. Aortic valve regurgitation is not visualized. Aortic valve mean gradient measures 2.5 mmHg. Aortic valve peak gradient measures 4.7 mmHg. Aortic valve area, by VTI measures 3.22 cm. Pulmonic Valve: Pulmonic valve regurgitation is not  visualized. Aorta: The aortic root and ascending aorta are structurally normal, with no evidence of dilitation. Venous: The inferior vena cava is normal in size with greater than 50% respiratory variability, suggesting right atrial pressure of 3 mmHg. IAS/Shunts: No atrial level shunt detected by color flow Doppler.  LEFT VENTRICLE PLAX 2D LVIDd:         3.60 cm     Diastology LVIDs:         2.30 cm     LV e' medial:    6.96 cm/s LV PW:         0.90 cm     LV E/e' medial:  12.6 LV IVS:        0.90 cm     LV e' lateral:   12.10 cm/s LVOT diam:     1.90 cm     LV E/e' lateral: 7.2 LV SV:         54 LV SV Index:   35 LVOT Area:     2.84 cm  LV Volumes (MOD) LV vol d, MOD A2C: 59.4 ml LV vol d, MOD A4C: 77.8 ml LV vol s, MOD A2C: 19.3 ml LV vol s, MOD A4C: 31.1 ml LV SV MOD A2C:     40.1 ml LV SV MOD A4C:     77.8 ml LV SV MOD BP:      43.3 ml RIGHT VENTRICLE RV Basal diam:  2.40 cm RV Mid diam:    1.90 cm RV S prime:     17.40 cm/s TAPSE (M-mode): 1.6 cm LEFT ATRIUM             Index        RIGHT ATRIUM          Index LA diam:        2.90 cm 1.88 cm/m   RA Area:     7.82 cm LA Vol (A2C):   32.2 ml 20.86 ml/m  RA Volume:   14.40 ml 9.33 ml/m LA Vol (A4C):   21.3 ml 13.80 ml/m LA Biplane Vol: 26.6 ml 17.23 ml/m  AORTIC VALVE                    PULMONIC VALVE AV Area (Vmax):    2.71 cm     PV Vmax:  1.17 m/s AV Area (Vmean):   2.39 cm     PV Peak grad:  5.5 mmHg AV Area (VTI):     3.22 cm AV Vmax:           108.65 cm/s AV Vmean:          74.500 cm/s AV VTI:            0.168 m AV Peak Grad:      4.7 mmHg AV Mean Grad:      2.5 mmHg LVOT Vmax:         104.00 cm/s LVOT Vmean:        62.700 cm/s LVOT VTI:          0.191 m LVOT/AV VTI ratio: 1.14  AORTA Ao Root diam: 3.10 cm Ao Asc diam:  3.60 cm MITRAL VALVE MV Area (PHT): 5.13 cm     SHUNTS MV Decel Time: 148 msec     Systemic VTI:  0.19 m MV E velocity: 87.50 cm/s   Systemic Diam: 1.90 cm MV A velocity: 123.00 cm/s MV E/A ratio:  0.71 Mary [employer] promotion account executive signed by Phineas Inches Signature Date/Time: 05/27/2022/12:29:31 PM    Final       LOS: 5 days    Cordelia Poche, MD [facility] 05/28/2022, 8:53 AM   If 7PM-7AM, please contact night-coverage [email or web address]

## 2022-05-29 ENCOUNTER — Inpatient Hospital Stay (HOSPITAL_COMMUNITY): Payer: Medicare HMO

## 2022-05-29 DIAGNOSIS — A419 Sepsis, unspecified organism: Secondary | ICD-10-CM | POA: Diagnosis not present

## 2022-05-29 DIAGNOSIS — G9341 Metabolic encephalopathy: Secondary | ICD-10-CM | POA: Diagnosis not present

## 2022-05-29 DIAGNOSIS — N3001 Acute cystitis with hematuria: Secondary | ICD-10-CM | POA: Diagnosis not present

## 2022-05-29 LAB — COMPREHENSIVE METABOLIC PANEL
ALT: 10 U/L (ref 0–44)
AST: 24 U/L (ref 15–41)
Albumin: 1.7 g/dL — ABNORMAL LOW (ref 3.5–5.0)
Alkaline Phosphatase: 144 U/L — ABNORMAL HIGH (ref 38–126)
Anion gap: 4 — ABNORMAL LOW (ref 5–15)
BUN: 22 mg/dL (ref 8–23)
CO2: 19 mmol/L — ABNORMAL LOW (ref 22–32)
Calcium: 7.9 mg/dL — ABNORMAL LOW (ref 8.9–10.3)
Chloride: 116 mmol/L — ABNORMAL HIGH (ref 98–111)
Creatinine, Ser: 0.68 mg/dL (ref 0.44–1.00)
GFR, Estimated: >60 mL/min (ref >60)
Glucose, Bld: 148 mg/dL — ABNORMAL HIGH (ref 70–99)
Potassium: 3.8 mmol/L (ref 3.5–5.1)
Sodium: 139 mmol/L (ref 135–145)
Total Bilirubin: 0.4 mg/dL (ref 0.3–1.2)
Total Protein: 4.9 g/dL — ABNORMAL LOW (ref 6.5–8.1)

## 2022-05-29 LAB — CBC
HCT: 24.6 % — ABNORMAL LOW (ref 36.0–46.0)
Hemoglobin: 7.4 g/dL — ABNORMAL LOW (ref 12.0–15.0)
MCH: 26.8 pg (ref 26.0–34.0)
MCHC: 30.1 g/dL (ref 30.0–36.0)
MCV: 89.1 fL (ref 80.0–100.0)
Platelets: 243 10*3/uL (ref 150–400)
RBC: 2.76 MIL/uL — ABNORMAL LOW (ref 3.87–5.11)
RDW: 14.9 % (ref 11.5–15.5)
WBC: 11.5 10*3/uL — ABNORMAL HIGH (ref 4.0–10.5)
nRBC: 0 % (ref 0.0–0.2)

## 2022-05-29 LAB — GLUCOSE, CAPILLARY
Glucose-Capillary: 153 mg/dL — ABNORMAL HIGH (ref 70–99)
Glucose-Capillary: 149 mg/dL — ABNORMAL HIGH (ref 70–99)
Glucose-Capillary: 148 mg/dL — ABNORMAL HIGH (ref 70–99)
Glucose-Capillary: 125 mg/dL — ABNORMAL HIGH (ref 70–99)
Glucose-Capillary: 133 mg/dL — ABNORMAL HIGH (ref 70–99)

## 2022-05-29 MED ORDER — FENTANYL CITRATE (PF) 100 MCG/2ML IJ SOLN
INTRAMUSCULAR | Status: AC | PRN
  Administered 2022-05-29: 50 ug via INTRAVENOUS

## 2022-05-29 MED ORDER — FENTANYL CITRATE (PF) 100 MCG/2ML IJ SOLN
INTRAMUSCULAR | Status: AC
  Filled 2022-05-29: qty 2

## 2022-05-29 MED ORDER — MIDAZOLAM HCL 2 MG/2ML IJ SOLN
INTRAMUSCULAR | Status: AC
  Filled 2022-05-29: qty 4

## 2022-05-29 NOTE — Progress Notes (Addendum)
PROGRESS NOTE    Brandy Wheeler  LNL:[id number] DOB: 09-Jul-1931 DOA: 05/23/2022 PCP: Pcp, No   Brief Narrative: Brandy Wheeler is a 89 y.o. female with a history of bladder cancer s/p robotic cystectomy and ileal conduit diversion, GERD and anemia. Patient presented secondary to altered mental status and found to have evidence of sepsis secondary to UTI. Empiric antibiotics started. Hospitalization complicated by persistent altered mental status and severe hypercalcemia. Right tibia/fibula pain with evidence of possible osteomyelitis vs possible malignancy.   Assessment and Plan:  Acute metabolic encephalopathy Present on admission. With hypercalcemia improved, it seems mental status is more likely related to infection. CT head unremarkable for acute process. Patient is showing improvement after adjusting antibiotics. SLP has advanced patient to a dysphagia 1 diet. -MRI brain today  Severe sepsis Present/criteria met on admission. Secondary to UTI. Blood and urine cultures obtained on admission. Procalcitonin negative. Urine culture is significant for MRSA and streptococcus mitas/oralis. Blood cultures are no growth to date.  UTI Urinalysis suggests UTI. Complicated by recent urologic procedure. Patient started empirically on Cefepime. Urine culture culture significant for MRSA and streptococcus mitis/oralis -Continue Vancomycin  Hypercalcemia Appears to be more likely related to underlying cancer. Patient is also on vitamin D supplementation. Calcium of 12.3 on admission. 1,25- and 25-hydroxy vitamin D levels of 44.3 and 91.4 respectively (wnl). PTH of 6 (low). Zometa and calcitonin given. Calcium improved. Corrected calcium of 10.1. -D5 1/2 NS for associated hypernatremia on NS IV fluids -Daily C/BMP -PTHrP (2/3) pending  Right ankle/leg pain CT tib/fib suggests early osteomyelitis. CT report also suggests underlying metastatic lesion. Discussed with on-call [facility] surgery,  Dr. Grandville Silos, who recommended MRI vs CT guided biopsy. Discussed with IR who recommended MRI followed by CT-guided biopsy. ID consulted. MRI tib/fib consistent with possible metastatic lesions vs multifocal osteomyelitis. Transthoracic Echocardiogram significant for no evidence of vegetation. -IR consulted for bone biopsy/culture -ID recommendations: awaiting bone cultures  Elevated creatinine, resolved Mild elevation with associated elevated BUN of 50. Patient with mild hydronephrosis on admission. Good urine output noted. BUN and creatinine improved. -Strict in/out  Leukocytosis Initial improvement, now slightly elevated. No associated fevers -CBC in AM  Hypernatremia, resolved Secondary to poor oral intake and isotonic saline for treatment of hypercalcemia. Resolved with D5 water.  Abdominal tenderness Unclear etiology. No associated emesis. Patient with documented bowel movements. Abdominal x-ray unremarkable. Tenderness appears to be resolved.  Hyperkalemia Mild. Resolved.  Hypokalemia Mild. Resolved with potassium supplementation  GERD -Resume Protonix  Anemia Appears to be post-operative. Hemoglobin of 9.7 on admission with decrease. Stablized. No obvious evidence of bleeding. Likely related to dilution effect. Downward trend again today. -Transfuse for hemoglobin <7 -CBC in AM  Left tib/fib fracture Patient follows with orthopedic surgery and has a casted left leg. She is non-weight bearing of that leg -PT/OT eval  Oligometastatic bladder cancer Patient follows with oncology and urology. S/p robotic cystectomy with node dissection and conduit diversion.  Severe malnutrition Dietitian recommendations: Prosource Plus PO TID, each provides 100 kcals and 15g protein  Magic cup TID with meals, each supplement provides 290 kcal and 9 grams of protein  Multivitamin with minerals daily   DVT prophylaxis: Heparin subq Code Status:   Code Status: Full Code Family  Communication: Niece at bedside. Daughter via telephone (2/6) Disposition Plan: Discharge back to SNF likely in 3+ days pending improvement of mental status, improvement of hypercalcemia, transition to oral antibiotics if able   Consultants:  Urology Infectious disease  Procedures:  None  Antimicrobials: Cefepime  Ceftriaxone Vancomycin   Subjective: No issues today per patient.  Objective: BP (!) 137/51 (BP Location: Right Arm)   Pulse (!) 102   Temp (!) 97.5 F (36.4 C) (Oral)   Resp (!) 22   Ht '5\' 3"'$  (1.6 m)   Wt 54.5 kg   SpO2 100%   BMI 21.27 kg/m   Examination:  General exam: Appears calm and comfortable Respiratory system: Clear to auscultation. Respiratory effort normal. Cardiovascular system: S1 & S2 heard, RRR. Gastrointestinal system: Abdomen is nondistended, soft and nontender. Normal bowel sounds heard. Central nervous system: Alert and oriented to person and place.    Data Reviewed: I have personally reviewed following labs and imaging studies  CBC Lab Results  Component Value Date   WBC 11.5 (H) 05/29/2022   RBC 2.76 (L) 05/29/2022   HGB 7.4 (L) 05/29/2022   HCT 24.6 (L) 05/29/2022   MCV 89.1 05/29/2022   MCH 26.8 05/29/2022   PLT 243 05/29/2022   MCHC 30.1 05/29/2022   RDW 14.9 05/29/2022   LYMPHSABS 2.6 05/23/2022   MONOABS 1.1 (H) 05/23/2022   EOSABS 0.3 05/23/2022   BASOSABS 0.1 08/65/7846     Last metabolic panel Lab Results  Component Value Date   NA 139 05/29/2022   K 3.8 05/29/2022   CL 116 (H) 05/29/2022   CO2 19 (L) 05/29/2022   BUN 22 05/29/2022   CREATININE 0.68 05/29/2022   GLUCOSE 148 (H) 05/29/2022   GFRNONAA >60 05/29/2022   CALCIUM 7.9 (L) 05/29/2022   PROT 4.9 (L) 05/29/2022   ALBUMIN 1.7 (L) 05/29/2022   BILITOT 0.4 05/29/2022   ALKPHOS 144 (H) 05/29/2022   AST 24 05/29/2022   ALT 10 05/29/2022   ANIONGAP 4 (L) 05/29/2022    GFR: Estimated Creatinine Clearance: 40.2 mL/min (by C-G formula based on  SCr of 0.68 mg/dL).  Recent Results (from the past 240 hour(s))  Urine Culture (for pregnant, neutropenic or urologic patients or patients with an indwelling urinary catheter)     Status: Abnormal   Collection Time: 05/23/22  3:50 PM   Specimen: Urine, Clean Catch  Result Value Ref Range Status   Specimen Description   Final    URINE, CLEAN CATCH Performed at [facility], [place] [address], [place], [place] [address]    Special Requests   Final    NONE Performed at [facility], [place] [address] [place], [place] [address]    Culture (A)  Final    >=100,000 COLONIES/mL METHICILLIN RESISTANT STAPHYLOCOCCUS AUREUS 60,000 COLONIES/mL STREPTOCOCCUS MITIS/ORALIS    Report Status 05/26/2022 FINAL  Final   Organism ID, Bacteria METHICILLIN RESISTANT STAPHYLOCOCCUS AUREUS (A)  Final      Susceptibility   Methicillin resistant staphylococcus aureus - MIC*    CIPROFLOXACIN >=8 RESISTANT Resistant     GENTAMICIN <=0.5 SENSITIVE Sensitive     NITROFURANTOIN <=16 SENSITIVE Sensitive     OXACILLIN >=4 RESISTANT Resistant     TETRACYCLINE <=1 SENSITIVE Sensitive     VANCOMYCIN 1 SENSITIVE Sensitive     TRIMETH/SULFA <=10 SENSITIVE Sensitive     CLINDAMYCIN <=0.25 SENSITIVE Sensitive     RIFAMPIN <=0.5 SENSITIVE Sensitive     Inducible Clindamycin NEGATIVE Sensitive     * >=100,000 COLONIES/mL METHICILLIN RESISTANT STAPHYLOCOCCUS AUREUS  Culture, blood (Routine X 2) w Reflex to ID Panel     Status: None   Collection Time: 05/23/22  4:00 PM   Specimen: BLOOD  Result  Value Ref Range Status   Specimen Description   Final    BLOOD PORTA CATH Performed at [place] [address] [place], [place] [address]    Special Requests   Final    BOTTLES DRAWN AEROBIC AND ANAEROBIC Blood Culture results may not be optimal due to an inadequate volume of blood received in culture bottles Performed at [place] [address] [place], [place] [address]    Culture   Final    NO GROWTH 5 DAYS Performed at [facility], [place] [address] [place], [place] [address]    Report Status 05/28/2022 FINAL  Final  Resp panel by RT-PCR (RSV, Flu A&B, Covid) Anterior Nasal Swab     Status: None   Collection Time: 05/23/22  4:02 PM   Specimen: Anterior Nasal Swab  Result Value Ref Range Status   SARS Coronavirus 2 by RT PCR NEGATIVE NEGATIVE Final    Comment: (NOTE) SARS-CoV-2 target nucleic acids are NOT DETECTED.  The SARS-CoV-2 RNA is generally detectable in upper respiratory specimens during the acute phase of infection. The lowest concentration of SARS-CoV-2 viral copies this assay can detect is 138 copies/mL. A negative result does not preclude SARS-Cov-2 infection and should not be used as the sole basis for treatment or other patient management decisions. A negative result may occur with  improper specimen collection/handling, submission of specimen other than nasopharyngeal swab, presence of viral mutation(s) within the areas targeted by this assay, and inadequate number of viral copies(<138 copies/mL). A negative result must be combined with clinical observations, patient history, and epidemiological information. The expected result is Negative.  Fact Sheet for Patients:  EntrepreneurPulse.com.au  Fact Sheet for Healthcare Providers:  IncredibleEmployment.be  This test is no t yet approved or cleared by the [place] FDA and  has been authorized for detection and/or diagnosis of SARS-CoV-2 by FDA under an Emergency Use Authorization (EUA). This EUA will remain  in effect (meaning this test can be used) for the duration of the COVID-19 declaration under Section 564(b)(1) of the Act, 21 U.S.C.section 360bbb-3(b)(1), unless the authorization is terminated  or revoked sooner.       Influenza A by PCR NEGATIVE NEGATIVE Final   Influenza B by PCR NEGATIVE  NEGATIVE Final    Comment: (NOTE) The Xpert Xpress SARS-CoV-2/FLU/RSV plus assay is intended as an aid in the diagnosis of influenza from Nasopharyngeal swab specimens and should not be used as a sole basis for treatment. Nasal washings and aspirates are unacceptable for Xpert Xpress SARS-CoV-2/FLU/RSV testing.  Fact Sheet for Patients: EntrepreneurPulse.com.au  Fact Sheet for Healthcare Providers: IncredibleEmployment.be  This test is not yet approved or cleared by the [place] FDA and has been authorized for detection and/or diagnosis of SARS-CoV-2 by FDA under an Emergency Use Authorization (EUA). This EUA will remain in effect (meaning this test can be used) for the duration of the COVID-19 declaration under Section 564(b)(1) of the Act, 21 U.S.C. section 360bbb-3(b)(1), unless the authorization is terminated or revoked.     Resp Syncytial Virus by PCR NEGATIVE NEGATIVE Final    Comment: (NOTE) Fact Sheet for Patients: EntrepreneurPulse.com.au  Fact Sheet for Healthcare Providers: IncredibleEmployment.be  This test is not yet approved or cleared by the [place] FDA and has been authorized for detection and/or diagnosis of SARS-CoV-2 by FDA under an Emergency Use Authorization (EUA). This EUA will remain in effect (meaning this test can be used) for the duration of the  COVID-19 declaration under Section 564(b)(1) of the Act, 21 U.S.C. section 360bbb-3(b)(1), unless the authorization is terminated or revoked.  Performed at [facility], [place] [address] [place], [place] [address]   Culture, blood (Routine X 2) w Reflex to ID Panel     Status: None   Collection Time: 05/23/22  9:47 PM   Specimen: BLOOD  Result Value Ref Range Status   Specimen Description   Final    BLOOD BLOOD RIGHT HAND Performed at [place] [address], [place], [place]  [address]    Special Requests   Final    BOTTLES DRAWN AEROBIC ONLY Blood Culture results may not be optimal due to an inadequate volume of blood received in culture bottles Performed at [place] [address] [place], [place] [address]    Culture   Final    NO GROWTH 5 DAYS Performed at [facility], [place] [address], [place], [place] [address]    Report Status 05/28/2022 FINAL  Final  Respiratory (~20 pathogens) panel by PCR     Status: None   Collection Time: 05/24/22  6:39 PM   Specimen: Nasopharyngeal Swab; Respiratory  Result Value Ref Range Status   Adenovirus NOT DETECTED NOT DETECTED Final   Coronavirus 229E NOT DETECTED NOT DETECTED Final    Comment: (NOTE) The Coronavirus on the Respiratory Panel, DOES NOT test for the novel  Coronavirus (2019 nCoV)    Coronavirus HKU1 NOT DETECTED NOT DETECTED Final   Coronavirus NL63 NOT DETECTED NOT DETECTED Final   Coronavirus OC43 NOT DETECTED NOT DETECTED Final   Metapneumovirus NOT DETECTED NOT DETECTED Final   Rhinovirus / Enterovirus NOT DETECTED NOT DETECTED Final   Influenza A NOT DETECTED NOT DETECTED Final   Influenza B NOT DETECTED NOT DETECTED Final   Parainfluenza Virus 1 NOT DETECTED NOT DETECTED Final   Parainfluenza Virus 2 NOT DETECTED NOT DETECTED Final   Parainfluenza Virus 3 NOT DETECTED NOT DETECTED Final   Parainfluenza Virus 4 NOT DETECTED NOT DETECTED Final   Respiratory Syncytial Virus NOT DETECTED NOT DETECTED Final   Bordetella pertussis NOT DETECTED NOT DETECTED Final   Bordetella Parapertussis NOT DETECTED NOT DETECTED Final   Chlamydophila pneumoniae NOT DETECTED NOT DETECTED Final   Mycoplasma pneumoniae NOT DETECTED NOT DETECTED Final    Comment: Performed at [facility], [place] [address] [place], [place] [address]      Radiology Studies: MR BRAIN WO CONTRAST  Result Date: 05/28/2022 CLINICAL DATA:  Mental status change, cause. EXAM: MRI HEAD WITHOUT CONTRAST  TECHNIQUE: Multiplanar, multiecho pulse sequences of the brain and surrounding structures were obtained without intravenous contrast. COMPARISON:  None Available. FINDINGS: Brain: No acute infarction, hemorrhage, hydrocephalus, extra-axial collection or mass lesion. 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 Scattered foci of T2 hyperintensity are seen within the white matter of the cerebral hemispheres, nonspecific, most likely related to chronic small vessel ischemia. Vascular: Normal flow voids. Skull and upper cervical spine: Normal marrow signal. Sinuses/Orbits: Decrease volume and opacification of the right maxillary sinus with mild lowering of the right orbital floor, suggesting silent sinus syndrome. Mild right mastoid effusion. Other: None. IMPRESSION: 1. No acute intracranial abnormality. 2. Mild chronic microvascular ischemic changes of the white matter. 3. 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 Electronically Signed   By: Sandre Kitty.D.  On: 05/28/2022 15:28   MR TIBIA FIBULA RIGHT W WO CONTRAST  Result Date: 05/28/2022 CLINICAL DATA:  Bone mass or bone pain, leg, aggressive features on x-ray. Primary tip tibial lesion on CT. History of bladder cancer. EXAM: MRI OF LOWER RIGHT EXTREMITY WITHOUT AND WITH CONTRAST TECHNIQUE: Multiplanar, multisequence MR imaging of the right lower leg was performed both before and after administration of intravenous contrast. CONTRAST:  66m GADAVIST GADOBUTROL 1 MMOL/ML IV SOLN COMPARISON:  Radiographs 05/24/2022.  CT 05/25/2022. FINDINGS: Bones/Joint/Cartilage Both lower legs are included on the coronal images. There are extensive widespread osseous abnormalities and both lower legs. The lesion of concern in the mid right tibial diaphysis measures approximately 4.9 cm in length and is associated with permeative destruction of the cortex anteriorly,  periosteal reaction and an enhancing soft tissue mass measuring approximately 2.8 x 2.5 cm transverse on image 39/20. Similar appearing permeative lesion involving the distal right fibular diaphysis extends up to 4.1 cm in length. There are numerous additional smaller lesions throughout the right tibia as well as the visualized right tarsal bones. In the left lower leg, the marrow abnormalities are more diffuse, with near complete involvement of the tibia and a large distal fibular lesion. No large joint effusions or definite pathologic fractures are identified. Ligaments No significant ligamentous abnormalities on large field-of-view imaging. Muscles and Tendons Multifocal periosteal reaction associated with the osseous lesions described above. No intramuscular masses are identified in the right lower leg. Incidental imaging of the left lower leg demonstrates extensive edema throughout the musculature. Soft tissues The soft tissue gas around the right knee seen on the recent CT is not clearly visualized. No focal soft tissue masses or fluid collections are identified aside from those associated with the permeative bone lesions. As above, there is periosteal edema and enhancement as well a small amount of subcutaneous edema in the mid right lower leg. In the visualized left lower leg, there is more diffuse soft tissue edema as well as apparent soft tissue emphysema medially in the proximal lower leg. IMPRESSION: 1. Widespread osseous abnormalities throughout both lower legs. This pattern can be seen with metastatic disease and multiple myeloma. As suggested on CT, given previous soft tissue findings and admission for sepsis, findings could reflect multifocal osteomyelitis. Correlate clinically and consider further systemic assessment with whole-body bone scan or PET-CT. 2. No focal soft tissue masses or fluid collections are identified in the right lower leg aside from those associated with the permeative bone  lesions. 3. More extensive soft tissue abnormalities throughout the visualized left lower leg with soft tissue emphysema and edema. Electronically Signed   By: WRichardean SaleM.D.   On: 05/28/2022 08:22      LOS: 6 days    RCordelia Poche MD [facility] 05/29/2022, 12:35 PM   If 7PM-7AM, please contact night-coverage [email or web address]

## 2022-05-29 NOTE — TOC Progression Note (Signed)
Transition of Care [facility]) - Progression Note    Patient Details  Name: Brandy Wheeler MRN: [id number] Date of Birth: October 21, 1931  [facility]) CM/SW [place], RN Phone Number:[phone]  05/29/2022, 4:01 PM  Clinical Narrative:    CM has verified that patient is patient comes from [facility] and the plan is to return when medically stable for discharge.   [place] called daughter Brandy Wheeler to verify patient from [facility] to return to [facility]. There is no answer. Voicemail has been left for daughter, Will await return call.   1600 Daughter Brandy Wheeler returned call and has confirmed that the plan is for patient to return to [place].   Expected Discharge Plan: [place] Barriers to Discharge: Continued Medical Work up  Expected Discharge Plan and Services   Discharge Planning Services: CM Consult   Living arrangements for the past 2 months: [place]                                       Social Determinants of Health (SDOH) Interventions SDOH Screenings   Food Insecurity: No Food Insecurity (05/28/2022)  Housing: Low Risk  (05/28/2022)  Transportation Needs: No Transportation Needs (05/28/2022)  Utilities: Not At Risk (05/28/2022)  Tobacco Use: Low Risk  (05/28/2022)    Readmission Risk Interventions     No data to display

## 2022-05-29 NOTE — Progress Notes (Signed)
Report given to [place] on [facility]. Notified daughter Brandy Wheeler of transfer to room 1617.

## 2022-05-29 NOTE — Progress Notes (Signed)
Pharmacy Antibiotic Note  Brandy Wheeler is a 89 y.o. female admitted on 05/23/2022 with sepsis.  Patient initially on cefepime but today urine culture resulted with MRSA.  MD discontinued cefepime and Pharmacy has been consulted for Vancomycin dosing for MRSA UTI.  Plan: Continue Vancomycin 750 mg IV q24h (SCr rounded to 0.8, eAUC 516) Measure Vanc levels as needed.  Goal AUC = 400 - 550  Follow up renal function, culture results, and clinical course.  Height: '5\' 3"'$  (160 cm) Weight: 54.5 kg (120 lb 1.6 oz) IBW/kg (Calculated) : 52.4  Temp (24hrs), Avg:98.2 F (36.8 C), Min:97.5 F (36.4 C), Max:98.9 F (37.2 C)  Recent Labs  Lab 05/23/22 1600 05/24/22 0724 05/24/22 2346 05/25/22 0443 05/26/22 0500 05/26/22 1109 05/27/22 0449 05/27/22 0805 05/28/22 0555 05/29/22 0540  WBC 15.8*   < >  --  10.1 9.4  --   --  11.0* 11.3* 11.5*  CREATININE 0.97   < >  --  1.09* 0.72 0.89 0.68  --  0.68 0.68  LATICACIDVEN 1.2  --  0.9  --   --   --   --   --   --   --    < > = values in this interval not displayed.     Estimated Creatinine Clearance: 40.2 mL/min (by C-G formula based on SCr of 0.68 mg/dL).    No Known Allergies  Antimicrobials this admission: 1/31 CTX x 1 1/31 cefepime>> 2/3 2/3 Vancomycin >>   Microbiology results: 1/31 UCx:  > 100k MRSA, 60k strep mitis/oralis F 1/31 BCx: NGF 2/1 resp panel neg   Thank you for allowing pharmacy to be a part of this patient's care.  Gretta Arab PharmD, BCPS [facility] main pharmacy [phone] 05/29/2022 10:31 AM

## 2022-05-29 NOTE — Progress Notes (Signed)
Occupational Therapy Treatment Patient Details Name: Meredith Kilbride MRN: [id number] DOB: 28-Apr-1931 Today's Date: 05/29/2022   History of present illness (89 y.o. female with PMH of bladder CA s/p robotic cystectomy & ileal conduit diversion 05/09/2022, anemia, L tib/fib fx NWB (pt has cast) who presented to the ED from SN[facility] for evaluation of poor oral intake & weakness. Dx of sepsis, UTI.)   OT comments  Patient presented with improved attention, command follow, ability to converse and answer questions, and generally improved overall cognition today, as compared to her presentation during the last session. She progressed to sitting edge of bed for a respectable duration while performing grooming and upper body dressing tasks. She needed mod assist x2 for bed mobility. She will continue to benefit from further OT services to facilitate progressive ADL performance.    Recommendations for follow up therapy are one component of a multi-disciplinary discharge planning process, led by the attending physician.  Recommendations may be updated based on patient status, additional functional criteria and insurance authorization.    Follow Up Recommendations  Skilled nursing-short term rehab (<3 hours/day)     Assistance Recommended at Discharge Frequent or constant Supervision/Assistance  Patient can return home with the following  Assistance with feeding;Two people to help with walking and/or transfers;Two people to help with bathing/dressing/bathroom;Direct supervision/assist for medications management   Equipment Recommendations  Other (comment)       Precautions / Restrictions Precautions Precautions: Fall Required Braces or Orthoses:  (distal LLE cast) Restrictions Weight Bearing Restrictions: Yes LLE Weight Bearing: Non weight bearing       Mobility Bed Mobility Overal bed mobility: Needs Assistance Bed Mobility: Rolling, Supine to Sit, Sit to Supine Rolling: Mod assist, +2 for physical  assistance   Supine to sit: Mod assist, +2 for physical assistance Sit to supine: Mod assist, +2 for physical assistance   General bed mobility comments: Pt required general cues for sequencing, including advancing BLE off the bed, reaching for the bed rail and trunk shifting. She needed assist for LLE management, given weight of cast           ADL either performed or assessed with clinical judgement   ADL Overall ADL's : Needs assistance/impaired   Eating/Feeding Details (indicate cue type and reason): she is currently NPO, however based on clinical judgement, she would currently require min to mod assist for feeding Grooming: Sitting;Wash/dry face;Wash/dry hands;Oral care;Brushing hair Grooming Details (indicate cue type and reason): She performed grooming seated EOB, requiring min to occasional mod assist for tasks. She presented with intermittent shakiness of her distal BUE, requiring assist for device and item management, including opening lids and applying products. She further needed occasional assist for thoroughness with tasks and min verbal cues for short term recall.         Upper Body Dressing : Maximal assistance;Cueing for sequencing Upper Body Dressing Details (indicate cue type and reason): She doffed a hospital gown with mod assist, then required max assist for donning another clean one seated EOB. Lower Body Dressing: Total assistance;Bed level                        Cognition Arousal/Alertness: Awake/alert   Overall Cognitive Status: Impaired/Different from baseline            General Comments: Improved cognition as compared to prior session, able to follow 1 step commands with occasional repetition and slightly increased time, improved attention to tasks, able to answer simple questions and make  simple phrases                   Pertinent Vitals/ Pain       Pain Assessment Pain Assessment: No/denies pain         Frequency  Min 2X/week         Progress Toward Goals  OT Goals(current goals can now be found in the care plan section)  Progress towards OT goals: Progressing toward goals  Acute Rehab OT Goals OT Goal Formulation: With patient Time For Goal Achievement: 06/08/22 Potential to Achieve Goals: Good  Plan Discharge plan remains appropriate       AM-PAC OT "6 Clicks" Daily Activity     Outcome Measure   Help from another person eating meals?: A Lot Help from another person taking care of personal grooming?: A Lot Help from another person toileting, which includes using toliet, bedpan, or urinal?: A Lot Help from another person bathing (including washing, rinsing, drying)?: A Lot Help from another person to put on and taking off regular upper body clothing?: A Lot Help from another person to put on and taking off regular lower body clothing?: Total 6 Click Score: 11    End of Session    OT Visit Diagnosis: Other symptoms and signs involving cognitive function;Muscle weakness (generalized) (M62.81)   Activity Tolerance Patient tolerated treatment well   Patient Left in bed;with call bell/phone within reach;with bed alarm set;with family/visitor present   Nurse Communication Other (comment) (The nurse cleared the pt for participation in the session)        Time: 6712-4580 OT Time Calculation (min): 31 min  Charges: OT General Charges $OT Visit: 1 Visit OT Treatments $Self Care/Home Management : 8-22 mins $Therapeutic Activity: 8-22 mins     Leota Sauers, OTR/L 05/29/2022, 1:18 PM

## 2022-05-29 NOTE — Procedures (Signed)
Interventional Radiology Procedure:   Indications: Bladder cancer with bone lesions.  Metastatic disease vs osteomyelitis  Procedure: CT guided biopsy of right anterior bone lesion  Findings: Soft tissue and bone biopsies obtained from lesion in right anterior tibia.   Complications: No immediate complications noted.     EBL: Minimal  Plan: Return to inpatient floor.    Elisa Kutner R. Anselm Pancoast, MD  Pager: [phone]

## 2022-05-29 NOTE — Progress Notes (Signed)
PT Cancellation Note  Patient Details Name: Brandy Wheeler MRN: [id number] DOB: Aug 25, 1931   Cancelled Treatment:    Reason Eval/Treat Not Completed: Other (comment)  Pt off for biopsy early. Spoke with RN and pt is  back but received sedating medications for procedure and is sedated and unable to participate.  Will f/u later date. Abran Richard, PT Acute Rehab [facility] [phone]  Karlton Lemon 05/29/2022, 3:16 PM

## 2022-05-29 NOTE — Progress Notes (Signed)
SLP Cancellation Note  Patient Details Name: Brandy Wheeler MRN: [id number] DOB: 03-09-1932   Cancelled treatment:       Reason Eval/Treat Not Completed: Other (comment) (patient NPO for IR procedure. SLP will f/u when patient back on PO's)   Sonia Baller, MA, CCC-SLP Speech Therapy

## 2022-05-30 DIAGNOSIS — N390 Urinary tract infection, site not specified: Secondary | ICD-10-CM | POA: Diagnosis not present

## 2022-05-30 DIAGNOSIS — A419 Sepsis, unspecified organism: Secondary | ICD-10-CM | POA: Diagnosis not present

## 2022-05-30 LAB — COMPREHENSIVE METABOLIC PANEL
ALT: 10 U/L (ref 0–44)
AST: 26 U/L (ref 15–41)
Albumin: 1.7 g/dL — ABNORMAL LOW (ref 3.5–5.0)
Alkaline Phosphatase: 154 U/L — ABNORMAL HIGH (ref 38–126)
Anion gap: 7 (ref 5–15)
BUN: 22 mg/dL (ref 8–23)
CO2: 18 mmol/L — ABNORMAL LOW (ref 22–32)
Calcium: 7.5 mg/dL — ABNORMAL LOW (ref 8.9–10.3)
Chloride: 111 mmol/L (ref 98–111)
Creatinine, Ser: 0.59 mg/dL (ref 0.44–1.00)
GFR, Estimated: >60 mL/min (ref >60)
Glucose, Bld: 145 mg/dL — ABNORMAL HIGH (ref 70–99)
Potassium: 3.5 mmol/L (ref 3.5–5.1)
Sodium: 136 mmol/L (ref 135–145)
Total Bilirubin: 0.3 mg/dL (ref 0.3–1.2)
Total Protein: 4.7 g/dL — ABNORMAL LOW (ref 6.5–8.1)

## 2022-05-30 LAB — GLUCOSE, CAPILLARY
Glucose-Capillary: 126 mg/dL — ABNORMAL HIGH (ref 70–99)
Glucose-Capillary: 139 mg/dL — ABNORMAL HIGH (ref 70–99)
Glucose-Capillary: 143 mg/dL — ABNORMAL HIGH (ref 70–99)
Glucose-Capillary: 157 mg/dL — ABNORMAL HIGH (ref 70–99)

## 2022-05-30 LAB — PREPARE RBC (CROSSMATCH)

## 2022-05-30 LAB — IRON AND TIBC
Iron: 12 ug/dL — ABNORMAL LOW (ref 28–170)
Saturation Ratios: 10 % — ABNORMAL LOW (ref 10.4–31.8)
TIBC: 122 ug/dL — ABNORMAL LOW (ref 250–450)
UIBC: 110 ug/dL

## 2022-05-30 LAB — CBC
HCT: 23.5 % — ABNORMAL LOW (ref 36.0–46.0)
Hemoglobin: 7.1 g/dL — ABNORMAL LOW (ref 12.0–15.0)
MCH: 26.7 pg (ref 26.0–34.0)
MCHC: 30.2 g/dL (ref 30.0–36.0)
MCV: 88.3 fL (ref 80.0–100.0)
Platelets: 225 10*3/uL (ref 150–400)
RBC: 2.66 MIL/uL — ABNORMAL LOW (ref 3.87–5.11)
RDW: 15 % (ref 11.5–15.5)
WBC: 11 10*3/uL — ABNORMAL HIGH (ref 4.0–10.5)
nRBC: 0 % (ref 0.0–0.2)

## 2022-05-30 LAB — HEMOGLOBIN AND HEMATOCRIT, BLOOD
HCT: 26.6 % — ABNORMAL LOW (ref 36.0–46.0)
Hemoglobin: 8.4 g/dL — ABNORMAL LOW (ref 12.0–15.0)

## 2022-05-30 MED ORDER — ALUM & MAG HYDROXIDE-SIMETH 200-200-20 MG/5ML PO SUSP
30.0000 mL | ORAL | Status: DC | PRN
  Administered 2022-05-30: 30 mL via ORAL
  Filled 2022-05-30: qty 30

## 2022-05-30 MED ORDER — SODIUM CHLORIDE 0.9% IV SOLUTION: Freq: Once | INTRAVENOUS | Status: AC

## 2022-05-30 NOTE — Progress Notes (Signed)
PROGRESS NOTE  Brandy Wheeler  NID:[id number] DOB: Jan 09, 1932 DOA: 05/23/2022 PCP: Pcp, No   Brief Narrative: Patient is 89 year old female with history of bladder cancer status post robotic cystectomy, ileal conduit diversion, GERD, anemia who presented for the evaluation of altered mental status.  She was found to have sepsis secondary to UTI.  Started on antibiotics.  Hospital course remarkable for persistent altered mental status, severe hyperglycemia, right lower extremity pain with suspicion of malignancy versus osteomyelitis.  ID consulted and following.  Underwent CT-guided bone biopsy of right anterior bone lesion by IR on 2/6.  Assessment & Plan:  Principal Problem:   Sepsis due to urinary tract infection ([place]) Active Problems:   Bladder cancer (HCC)   Hypercalcemia   Hyperkalemia   Normocytic anemia   Acute metabolic encephalopathy   Protein-calorie malnutrition, severe   Hypokalemia   Acute cystitis with hematuria   Acute metabolic encephalopathy: Unclear etiology.  Initially thought to be from hypercalcemia  which has resolved.  CT head did not show any acute intracranial abnormalities.  MRI did not show any acute findings, showed chronic microvascular changes, chronic infarcts.  Minimize narcotics, sedatives.  Severe sepsis/UTI: Present on admission.  Procalcitonin negative.  Urine culture showed MRSA and Streptococcus mitis/oralis.  Blood cultures have not shown any growth.  ID following.  Continue vancomycin. History of recent urological procedure.  Has mild leukocytosis.  Right lower extremity lesion/right ankle/leg pain: CT tibia/fibula suggested possible osteomyelitis.  Case was discussed with on-call for orthopedics, Dr. Grandville Silos who recommended MRI versus CT-guided biopsy.  MRI tibia/fibula showed widespread osseous abnormalities throughout both lower legs with suspicion of metastatic disease / multiple myeloma.  Multifocal osteomyelitis also possible.  TTE did not show  any vegetation.  Underwent IR guided bone biopsy/culture.  Will follow-up cytology, culture.  Aerobic/anaerobic culture did not show any organism Elevated alkaline phosphatase likely from bone lesions  Hypercalcemia: Calcium of 12.3 on admission.  Vitamin D level normal.  PT is low.  Given Zometa, calcitonin.  Calcium level currently stable.  PTHrP pending.  Normocytic anemia: Hemoglobin in the range of 7.1.  Will check iron studies.  Will give her a unit of blood transfusion.  Will check FOBT.  No evidence of acute blood loss.  Patient was given IV fluids, hemodilution could also be a possibility.  Left tibia/fibula fracture: Follows with orthopedic surgery, has casted left leg, non weightbearing on left side.  PT/OT recommending SNF on discharge.  Oligometastatic bladder cancer: Follows with oncology and urology.  Recent history of robotic cystectomy with node dissection and conduit diversion  Severe protein calorie malnutrition: Nutrition is following  GERD: Continue Protonix  Goals of care: Elderly patient with multiple comorbidities, history of bladder cancer now with possible another malignancy.  Overall prognosis is poor.  Goals of care discussed with patient at bedside, she states she does not want to be resuscitated.  I called the daughter and discussed about this and the daughter says her mom is not in a good state of mind and she makes a decision for her and she wants to put her as full code. we requested palliative care evaluation. PT/OT recommending SNF on discharge       Nutrition Problem: Severe Malnutrition Etiology: chronic illness, cancer and cancer related treatments    DVT prophylaxis:heparin injection 5,000 Units Start: 05/29/22 2200     Code Status: Full Code  Family Communication: Called and discussed with daughter on phone on 2/7  Patient status: Inpatient  Patient is from :  Home  Anticipated discharge to:SNF  Estimated DC date:not  sure   Consultants: ID  Procedures: Bone biopsy  Antimicrobials:  Anti-infectives (From admission, onward)    Start     Dose/Rate Route Frequency Ordered Stop   05/27/22 1000  vancomycin (VANCOREADY) IVPB 750 mg/150 mL        750 mg 150 mL/hr over 60 Minutes Intravenous Every 24 hours 05/26/22 1147     05/26/22 1115  vancomycin (VANCOCIN) IVPB 1000 mg/200 mL premix        1,000 mg 200 mL/hr over 60 Minutes Intravenous  Once 05/26/22 1022 05/26/22 1241   05/23/22 2000  ceFEPIme (MAXIPIME) 2 g in sodium chloride 0.9 % 100 mL IVPB  Status:  Discontinued        2 g 200 mL/hr over 30 Minutes Intravenous Every 12 hours 05/23/22 1935 05/26/22 1002   05/23/22 1930  ceFEPIme (MAXIPIME) 2 g in sodium chloride 0.9 % 100 mL IVPB  Status:  Discontinued        2 g 200 mL/hr over 30 Minutes Intravenous  Once 05/23/22 1923 05/23/22 1935   05/23/22 1615  cefTRIAXone (ROCEPHIN) 2 g in sodium chloride 0.9 % 100 mL IVPB        2 g 200 mL/hr over 30 Minutes Intravenous  Once 05/23/22 1614 05/23/22 1740       Subjective: Patient seen and examined at bedside today.  Hemodynamically stable.  She was overall comfortable, lying in bed, appears weak but not in distress.  She denies any nausea, vomiting or abdominal pain.  On room air.  She is mostly oriented and knows the current month.  Obeys commands.  Objective: Vitals:   05/30/22 0202 05/30/22 0408 05/30/22 0500 05/30/22 1021  BP: (!) 140/49 (!) 134/51  (!) 128/52  Pulse: (!) 103 (!) 108  (!) 106  Resp: '16 18  19  '$ Temp: 98.2 F (36.8 C) 98.3 F (36.8 C)  98 F (36.7 C)  TempSrc: Oral Oral  Oral  SpO2: 100% 98%  98%  Weight:   55.5 kg   Height:        Intake/Output Summary (Last 24 hours) at 05/30/2022 1136 Last data filed at 05/30/2022 0407 Gross per 24 hour  Intake 270 ml  Output 1250 ml  Net -980 ml   Filed Weights   05/29/22 0500 05/29/22 0652 05/30/22 0500  Weight: 54.6 kg 54.5 kg 55.5 kg    Examination:  General exam: Overall  comfortable, not in distress, deconditioned, weak HEENT: PERRL Respiratory system:  no wheezes or crackles  Cardiovascular system: Sinus tachycardia, RRR.  Gastrointestinal system: Abdomen is nondistended, soft and nontender. Central nervous system: Alert and awake, mostly oriented Extremities: No edema, cast on the left leg, biopsy wound on the right leg Skin: No rashes, no ulcers,no icterus     Data Reviewed: I have personally reviewed following labs and imaging studies  CBC: Recent Labs  Lab 05/23/22 1600 05/24/22 0724 05/26/22 0500 05/26/22 1109 05/27/22 0805 05/28/22 0555 05/29/22 0540 05/30/22 0517  WBC 15.8*   < > 9.4  --  11.0* 11.3* 11.5* 11.0*  NEUTROABS 11.5*  --   --   --   --   --   --   --   HGB 9.7*   < > 7.1* 7.8* 8.0* 7.9* 7.4* 7.1*  HCT 31.6*   < > 24.3* 26.7* 27.1* 25.6* 24.6* 23.5*  MCV 89.5   < > 94.9  --  91.2 89.2 89.1 88.3  PLT 455*   < > 266  --  296 265 243 225   < > = values in this interval not displayed.   Basic Metabolic Panel: Recent Labs  Lab 05/26/22 1109 05/26/22 1743 05/27/22 0449 05/28/22 0555 05/29/22 0540 05/30/22 0517  NA 147* 143 142 139 139 136  K 4.3  --  3.8 3.3* 3.8 3.5  CL 120*  --  116* 113* 116* 111  CO2 21*  --  21* 19* 19* 18*  GLUCOSE 129*  --  164* 157* 148* 145*  BUN 36*  --  27* '23 22 22  '$ CREATININE 0.89  --  0.68 0.68 0.68 0.59  CALCIUM 9.1  --  8.9 8.3* 7.9* 7.5*     Recent Results (from the past 240 hour(s))  Urine Culture (for pregnant, neutropenic or urologic patients or patients with an indwelling urinary catheter)     Status: Abnormal   Collection Time: 05/23/22  3:50 PM   Specimen: Urine, Clean Catch  Result Value Ref Range Status   Specimen Description   Final    URINE, CLEAN CATCH Performed at [facility], [place] [address] [place], [place] [address]    Special Requests   Final    NONE Performed at [facility], [place] [address], [place], [place] [address]     Culture (A)  Final    >=100,000 COLONIES/mL METHICILLIN RESISTANT STAPHYLOCOCCUS AUREUS 60,000 COLONIES/mL STREPTOCOCCUS MITIS/ORALIS    Report Status 05/26/2022 FINAL  Final   Organism ID, Bacteria METHICILLIN RESISTANT STAPHYLOCOCCUS AUREUS (A)  Final      Susceptibility   Methicillin resistant staphylococcus aureus - MIC*    CIPROFLOXACIN >=8 RESISTANT Resistant     GENTAMICIN <=0.5 SENSITIVE Sensitive     NITROFURANTOIN <=16 SENSITIVE Sensitive     OXACILLIN >=4 RESISTANT Resistant     TETRACYCLINE <=1 SENSITIVE Sensitive     VANCOMYCIN 1 SENSITIVE Sensitive     TRIMETH/SULFA <=10 SENSITIVE Sensitive     CLINDAMYCIN <=0.25 SENSITIVE Sensitive     RIFAMPIN <=0.5 SENSITIVE Sensitive     Inducible Clindamycin NEGATIVE Sensitive     * >=100,000 COLONIES/mL METHICILLIN RESISTANT STAPHYLOCOCCUS AUREUS  Culture, blood (Routine X 2) w Reflex to ID Panel     Status: None   Collection Time: 05/23/22  4:00 PM   Specimen: BLOOD  Result Value Ref Range Status   Specimen Description   Final    BLOOD PORTA CATH Performed at [facility] [address], [place], [place] [address]    Special Requests   Final    BOTTLES DRAWN AEROBIC AND ANAEROBIC Blood Culture results may not be optimal due to an inadequate volume of blood received in culture bottles Performed at [facility] [address], [place], [place] [address]    Culture   Final    NO GROWTH 5 DAYS Performed at [facility], [place] [address], [place],  [address]    Report Status 05/28/2022 FINAL  Final  Resp panel by RT-PCR (RSV, Flu A&B, Covid) Anterior Nasal Swab     Status: None   Collection Time: 05/23/22  4:02 PM   Specimen: Anterior Nasal Swab  Result Value Ref Range Status   SARS Coronavirus 2 by RT PCR NEGATIVE NEGATIVE Final    Comment: (NOTE) SARS-CoV-2 target nucleic acids are NOT DETECTED.  The SARS-CoV-2 RNA is generally detectable in upper  respiratory specimens during the acute phase of infection. The lowest concentration  of SARS-CoV-2 viral copies this assay can detect is 138 copies/mL. A negative result does not preclude SARS-Cov-2 infection and should not be used as the sole basis for treatment or other patient management decisions. A negative result may occur with  improper specimen collection/handling, submission of specimen other than nasopharyngeal swab, presence of viral mutation(s) within the areas targeted by this assay, and inadequate number of viral copies(<138 copies/mL). A negative result must be combined with clinical observations, patient history, and epidemiological information. The expected result is Negative.  Fact Sheet for Patients:  EntrepreneurPulse.com.au  Fact Sheet for Healthcare Providers:  IncredibleEmployment.be  This test is no t yet approved or cleared by the [place] FDA and  has been authorized for detection and/or diagnosis of SARS-CoV-2 by FDA under an Emergency Use Authorization (EUA). This EUA will remain  in effect (meaning this test can be used) for the duration of the COVID-19 declaration under Section 564(b)(1) of the Act, 21 U.S.C.section 360bbb-3(b)(1), unless the authorization is terminated  or revoked sooner.       Influenza A by PCR NEGATIVE NEGATIVE Final   Influenza B by PCR NEGATIVE NEGATIVE Final    Comment: (NOTE) The Xpert Xpress SARS-CoV-2/FLU/RSV plus assay is intended as an aid in the diagnosis of influenza from Nasopharyngeal swab specimens and should not be used as a sole basis for treatment. Nasal washings and aspirates are unacceptable for Xpert Xpress SARS-CoV-2/FLU/RSV testing.  Fact Sheet for Patients: EntrepreneurPulse.com.au  Fact Sheet for Healthcare Providers: IncredibleEmployment.be  This test is not yet approved or cleared by the [place] FDA and has been  authorized for detection and/or diagnosis of SARS-CoV-2 by FDA under an Emergency Use Authorization (EUA). This EUA will remain in effect (meaning this test can be used) for the duration of the COVID-19 declaration under Section 564(b)(1) of the Act, 21 U.S.C. section 360bbb-3(b)(1), unless the authorization is terminated or revoked.     Resp Syncytial Virus by PCR NEGATIVE NEGATIVE Final    Comment: (NOTE) Fact Sheet for Patients: EntrepreneurPulse.com.au  Fact Sheet for Healthcare Providers: IncredibleEmployment.be  This test is not yet approved or cleared by the [place] FDA and has been authorized for detection and/or diagnosis of SARS-CoV-2 by FDA under an Emergency Use Authorization (EUA). This EUA will remain in effect (meaning this test can be used) for the duration of the COVID-19 declaration under Section 564(b)(1) of the Act, 21 U.S.C. section 360bbb-3(b)(1), unless the authorization is terminated or revoked.  Performed at [facility], [place] [address] [place], [place] [address]   Culture, blood (Routine X 2) w Reflex to ID Panel     Status: None   Collection Time: 05/23/22  9:47 PM   Specimen: BLOOD  Result Value Ref Range Status   Specimen Description   Final    BLOOD BLOOD RIGHT HAND Performed at [facility] [address], [place], [place] [address]    Special Requests   Final    BOTTLES DRAWN AEROBIC ONLY Blood Culture results may not be optimal due to an inadequate volume of blood received in culture bottles Performed at [facility] [address] [place], [place] [address]    Culture   Final    NO GROWTH 5 DAYS Performed at [facility], [place] [address], [place], [place] [address]    Report Status 05/28/2022 FINAL  Final  Respiratory (~20 pathogens) panel by PCR     Status: None   Collection Time:  05/24/22  6:39 PM   Specimen: Nasopharyngeal Swab;  Respiratory  Result Value Ref Range Status   Adenovirus NOT DETECTED NOT DETECTED Final   Coronavirus 229E NOT DETECTED NOT DETECTED Final    Comment: (NOTE) The Coronavirus on the Respiratory Panel, DOES NOT test for the novel  Coronavirus (2019 nCoV)    Coronavirus HKU1 NOT DETECTED NOT DETECTED Final   Coronavirus NL63 NOT DETECTED NOT DETECTED Final   Coronavirus OC43 NOT DETECTED NOT DETECTED Final   Metapneumovirus NOT DETECTED NOT DETECTED Final   Rhinovirus / Enterovirus NOT DETECTED NOT DETECTED Final   Influenza A NOT DETECTED NOT DETECTED Final   Influenza B NOT DETECTED NOT DETECTED Final   Parainfluenza Virus 1 NOT DETECTED NOT DETECTED Final   Parainfluenza Virus 2 NOT DETECTED NOT DETECTED Final   Parainfluenza Virus 3 NOT DETECTED NOT DETECTED Final   Parainfluenza Virus 4 NOT DETECTED NOT DETECTED Final   Respiratory Syncytial Virus NOT DETECTED NOT DETECTED Final   Bordetella pertussis NOT DETECTED NOT DETECTED Final   Bordetella Parapertussis NOT DETECTED NOT DETECTED Final   Chlamydophila pneumoniae NOT DETECTED NOT DETECTED Final   Mycoplasma pneumoniae NOT DETECTED NOT DETECTED Final    Comment: Performed at [facility], [place]. [address], [place], [place] [address]  Aerobic/Anaerobic Culture w Gram Stain (surgical/deep wound)     Status: None (Preliminary result)   Collection Time: 05/29/22  3:10 PM   Specimen: Bone; Tissue  Result Value Ref Range Status   Specimen Description   Final    BONE BIOPSY TIBIA RIGHT Performed at [facility], [place] [address], [place], [place] [address]    Special Requests   Final    NONE Performed at [facility], [place] [address] [place], [place] [address]    Gram Stain NO WBC SEEN NO ORGANISMS SEEN   Final   Culture   Final    NO GROWTH < 24 HOURS send results to dr. Baxter Flattery and Dr. Lonny Prude  Performed at [facility], [place] [address], [place], [place] [address]    Report Status PENDING   Incomplete     Radiology Studies: CT BONE TROCAR/NEEDLE BIOPSY SUPERFICIAL  Result Date: 05/29/2022 INDICATION: 89 year-old with bone lesions including a permeative lesion involving the anterior right tibia. EXAM: CT-GUIDED CORE BIOPSY OF RIGHT TIBIAL LESION MEDICATIONS: Fentanyl 50 mcg ANESTHESIA/SEDATION: The patient's level of consciousness and vital signs were monitored continuously by radiology nursing throughout the procedure under my direct supervision. FLUOROSCOPY TIME:  None COMPLICATIONS: None immediate. PROCEDURE: Informed consent was obtained for CT-guided biopsy. A timeout was performed prior to the initiation of the procedure. Patient was placed supine on the CT scanner. Images through the right lower leg were obtained. The permeative lesion involving the anterior right tibia was identified and targeted. Overlying skin was prepped with chlorhexidine and sterile field was created. Skin was anesthetized with 1% lidocaine. Small incision was made. Initially, 17 gauge coaxial needle was directed into the lesion and attempted to obtain a core biopsy with an 18 gauge BioPince needle. This needle was difficult to remove from the bone and only a small amount of soft tissue was removed. Therefore, an 11 gauge bone needle was directed towards the lesion and 14 gauge bone biopsy needle was advanced through the 11 gauge needle into the bone. Two core biopsies were obtained of the bone and placed in saline. Coaxial needle was removed. Follow-up imaging was obtained. Bandage placed over the  puncture site. FINDINGS: Again noted is a permeative lesion with destruction of the anterior cortex in the mid tibia. There is surrounding abnormal soft tissue. Small amount of soft tissue and 2 small bone core biopsies were obtained. IMPRESSION: CT-guided core biopsy of a lesion in the anterior right tibia. Electronically Signed   By: Markus Daft M.D.   On: 05/29/2022 16:10   MR BRAIN WO CONTRAST  Result Date:  05/28/2022 CLINICAL DATA:  Mental status change, cause. EXAM: MRI HEAD WITHOUT CONTRAST TECHNIQUE: Multiplanar, multiecho pulse sequences of the brain and surrounding structures were obtained without intravenous contrast. COMPARISON:  None Available. FINDINGS: Brain: No acute infarction, hemorrhage, hydrocephalus, extra-axial collection or mass lesion. 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 Scattered foci of T2 hyperintensity are seen within the white matter of the cerebral hemispheres, nonspecific, most likely related to chronic small vessel ischemia. Vascular: Normal flow voids. Skull and upper cervical spine: Normal marrow signal. Sinuses/Orbits: Decrease volume and opacification of the right maxillary sinus with mild lowering of the right orbital floor, suggesting silent sinus syndrome. Mild right mastoid effusion. Other: None. IMPRESSION: 1. No acute intracranial abnormality. 2. Mild chronic microvascular ischemic changes of the white matter. 3. 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 Electronically Signed   By: Pedro Earls M.D.   On: 05/28/2022 15:28    Scheduled Meds:  (feeding supplement) PROSource Plus  30 mL Oral TID BM   Chlorhexidine Gluconate Cloth  6 each Topical Daily   heparin  5,000 Units Subcutaneous Q8H   pantoprazole  40 mg Oral Daily   sodium chloride flush  3 mL Intravenous Q12H   Continuous Infusions:  dextrose 5 % and 0.45% NaCl 75 mL/hr at 05/30/22 0511   vancomycin 750 mg (05/30/22 0942)     LOS: 7 days   Shelly Coss, MD [facility] P2/10/2022, 11:36 AM

## 2022-05-30 NOTE — Care Management Important Message (Signed)
Important Message  Patient Details IM Letter given. Name: Brandy Wheeler MRN: [id number] Date of Birth: 02/27/1932   Medicare Important Message Given:  Yes     Kerin Salen 05/30/2022, 12:04 PM

## 2022-05-30 NOTE — Progress Notes (Signed)
Physical Therapy Treatment Patient Details Name: Brandy Wheeler MRN: [id number] DOB: [birth year] Today's Date: 05/30/2022   History of Present Illness Pt is 89 y.o. female who presented to the ED 05/23/22 from SNF for evaluation of poor oral intake & weakness. Dx of sepsis, UTI.  Hospital course remarkable for persistent altered mental status, severe hyperglycemia, right lower extremity pain with suspicion of malignancy versus osteomyelitis s/p bone biopsy on 2/6. Pt with medical history significant for bladder CA s/p robotic cystectomy & ileal conduit diversion 05/09/2022, anemia, L tib/fib fx early January 2024 with NWB (pt has cast)    PT Comments    Pt with improved cognition and ability to participate - still requiring multimodal cues, re-orientation, and increased time.  She required min A to transfer to EOB and mod x 2 to stand once.  Attempted again but pt fatiguing quickly.  She was able to participate in LE exercises with cues for controlled and full ROM.  Continue to advance as aable.    Recommendations for follow up therapy are one component of a multi-disciplinary discharge planning process, led by the attending physician.  Recommendations may be updated based on patient status, additional functional criteria and insurance authorization.  Follow Up Recommendations  Skilled nursing-short term rehab (<3 hours/day) Can patient physically be transported by private vehicle: No   Assistance Recommended at Discharge Frequent or constant Supervision/Assistance  Patient can return home with the following Two people to help with walking and/or transfers;A lot of help with bathing/dressing/bathroom;Assistance with cooking/housework;Assist for transportation;Help with stairs or ramp for entrance   Equipment Recommendations  None recommended by PT    Recommendations for Other Services       Precautions / Restrictions Precautions Precautions: Fall Required Braces or Orthoses:  Splint/Cast Splint/Cast: L LE cast Restrictions Weight Bearing Restrictions: Yes LLE Weight Bearing: Non weight bearing     Mobility  Bed Mobility Overal bed mobility: Needs Assistance Bed Mobility: Rolling, Supine to Sit, Sit to Supine Rolling: Min assist   Supine to sit: Min assist Sit to supine: Min assist   General bed mobility comments: Cues and increased time for transfers with use of bed rail and min A to scoot forward to sit and for legs back to bed.  Mod x 2 to reposition up in bed    Transfers Overall transfer level: Needs assistance Equipment used: Rolling walker (2 wheels) Transfers: Sit to/from Stand Sit to Stand: Mod assist, Max assist, +2 physical assistance           General transfer comment: Performed sit to stand from elevated EOB with mod x 2 for first attempt with good maintenance of L LE NWB with guarding.  After resting attempted again but pt fatigued and with max x 2 not able to stand completely upright    Ambulation/Gait                   Stairs             Wheelchair Mobility    Modified Rankin (Stroke Patients Only)       Balance Overall balance assessment: Needs assistance Sitting-balance support: Feet supported, No upper extremity supported Sitting balance-Leahy Scale: Good     Standing balance support: Bilateral upper extremity supported, Reliant on assistive device for balance, During functional activity Standing balance-Leahy Scale: Poor Standing balance comment: RW and min-mod A.  On first attempt pt was able to stand upright for ~10 seconds but did easily fatigue requring cues to tuck buttock  and straighten R knee.  On second attempt unable to fully stand                            Cognition Arousal/Alertness: Awake/alert Behavior During Therapy: WFL for tasks assessed/performed Overall Cognitive Status: Impaired/Different from baseline                   Orientation Level: Disoriented to,  Place, Time, Situation Current Attention Level: Sustained Memory: Decreased recall of precautions, Decreased short-term memory Following Commands: Follows one step commands inconsistently, Follows one step commands with increased time Safety/Judgement: Decreased awareness of deficits, Decreased awareness of safety Awareness: Intellectual Problem Solving: Slow processing, Requires tactile cues, Requires verbal cues General Comments: Pt with significant improvement from initial evaluation.  She was able to participate with therapy, follow simple commands with multimodal cues at times, and answer simple questions.  Pt with some confusion as to timeline, prior medical events, and events of hospitalizaiton        Exercises General Exercises - Lower Extremity Quad Sets: AROM, Both, 10 reps, Supine (tactile cues) Gluteal Sets: AROM, Both, 10 reps, Supine Long Arc Quad: AROM, Both, 10 reps, Seated (cues for full /controlled ROM) Heel Slides: AAROM, Left, AROM, Right, 10 reps, Supine Hip ABduction/ADduction: AAROM, Left, AROM, Right, 10 reps, Supine Hip Flexion/Marching: AROM, Both, 10 reps, Seated Other Exercises Other Exercises: Pt not able to flex/ext toes on L, reports stiff since ankle fracture.  Assisted pt in AAROM and stretching of all toes  10 with 5 sec holds    General Comments General comments (skin integrity, edema, etc.): VSS      Pertinent Vitals/Pain Pain Assessment Pain Assessment: Faces Faces Pain Scale: Hurts little more Pain Location: L ankle with movement Pain Descriptors / Indicators: Discomfort Pain Intervention(s): Limited activity within patient's tolerance, Monitored during session, Repositioned    Home Living                          Prior Function            PT Goals (current goals can now be found in the care plan section) Progress towards PT goals: Progressing toward goals    Frequency    Min 2X/week      PT Plan Current plan remains  appropriate    Co-evaluation              AM-PAC PT "6 Clicks" Mobility   Outcome Measure  Help needed turning from your back to your side while in a flat bed without using bedrails?: A Little Help needed moving from lying on your back to sitting on the side of a flat bed without using bedrails?: A Little Help needed moving to and from a bed to a chair (including a wheelchair)?: Total Help needed standing up from a chair using your arms (e.g., wheelchair or bedside chair)?: Total Help needed to walk in hospital room?: Total Help needed climbing 3-5 steps with a railing? : Total 6 Click Score: 10    End of Session Equipment Utilized During Treatment: Gait belt Activity Tolerance: Patient tolerated treatment well Patient left: in bed;with call bell/phone within reach;with bed alarm set Nurse Communication: Mobility status PT Visit Diagnosis: Difficulty in walking, not elsewhere classified (R26.2);Pain Pain - Right/Left: Left Pain - part of body: Ankle and joints of foot     Time: 9373-4287 PT Time Calculation (min) (ACUTE ONLY): 30 min  Charges:  $Therapeutic Exercise: 8-22 mins $Therapeutic Activity: 8-22 mins                     Abran Richard, PT Acute Rehab [facility] [phone]    Karlton Lemon 05/30/2022, 2:37 PM

## 2022-05-30 NOTE — Progress Notes (Signed)
[facility] for Infectious Disease    Date of Admission:  05/23/2022   Total days of antibiotics 5 vancomycin          ID: Brandy Wheeler is a 89 y.o. female with   Principal Problem:   Sepsis due to urinary tract infection ([place]) Active Problems:   Bladder cancer ([place])   Hypercalcemia   Hyperkalemia   Normocytic anemia   Acute metabolic encephalopathy   Protein-calorie malnutrition, severe   Hypokalemia   Acute cystitis with hematuria    Subjective: Afebrile, more alert, was able to work alittle bit with PT today  Medications:   (feeding supplement) PROSource Plus  30 mL Oral TID BM   sodium chloride   Intravenous Once   Chlorhexidine Gluconate Cloth  6 each Topical Daily   heparin  5,000 Units Subcutaneous Q8H   pantoprazole  40 mg Oral Daily   sodium chloride flush  3 mL Intravenous Q12H    Objective: Vital signs in last 24 hours: Temp:  [97.9 F (36.6 C)-98.3 F (36.8 C)] 98.1 F (36.7 C) (02/07 1330) Pulse Rate:  [103-111] 110 (02/07 1330) Resp:  [16-20] 19 (02/07 1330) BP: (112-140)/(45-63) 140/63 (02/07 1330) SpO2:  [97 %-100 %] 98 % (02/07 1330) Weight:  [55.5 kg] 55.5 kg (02/07 0500)  Physical Exam  Constitutional:  oriented to person, place, and time. appears well-developed and well-nourished. No distress.  HENT: [place]/AT, PERRLA, no scleral icterus Mouth/Throat: Oropharynx is clear and moist. No oropharyngeal exudate.  Cardiovascular: Normal rate, regular rhythm and normal heart sounds. Exam reveals no gallop and no friction rub.  No murmur heard.  Pulmonary/Chest: Effort normal and breath sounds normal. No respiratory distress.  has no wheezes.  Neck = supple, no nuchal rigidity Abdominal: Soft. Bowel sounds are normal.  exhibits no distension. There is no tenderness. Urostomy in place Lymphadenopathy: no cervical adenopathy. No axillary adenopathy Neurological: alert and oriented to person, place, and time.  Skin: Skin is warm and dry. No rash  noted. No erythema.  Psychiatric: a normal mood and affect.  behavior is normal.    Lab Results Recent Labs    05/29/22 0540 05/30/22 0517  WBC 11.5* 11.0*  HGB 7.4* 7.1*  HCT 24.6* 23.5*  NA 139 136  K 3.8 3.5  CL 116* 111  CO2 19* 18*  BUN 22 22  CREATININE 0.68 0.59   Liver Panel Recent Labs    05/29/22 0540 05/30/22 0517  PROT 4.9* 4.7*  ALBUMIN 1.7* 1.7*  AST 24 26  ALT 10 10  ALKPHOS 144* 154*  BILITOT 0.4 0.3    Microbiology: 1/31 urine cx MRSA Methicillin resistant staphylococcus aureus      MIC    CIPROFLOXACIN >=8 RESISTANT Resistant    CLINDAMYCIN <=0.25 SENS... Sensitive    GENTAMICIN <=0.5 SENSI... Sensitive    Inducible Clindamycin NEGATIVE Sensitive    NITROFURANTOIN <=16 SENSIT... Sensitive    OXACILLIN >=4 RESISTANT Resistant    RIFAMPIN <=0.5 SENSI... Sensitive    TETRACYCLINE <=1 SENSITIVE Sensitive    TRIMETH/SULFA <=10 SENSIT... Sensitive    VANCOMYCIN 1 SENSITIVE Sensitive    1/31 blood cx NGTD Studies/Results: CT BONE TROCAR/NEEDLE BIOPSY SUPERFICIAL  Result Date: 05/29/2022 INDICATION: 89 year-old with bone lesions including a permeative lesion involving the anterior right tibia. EXAM: CT-GUIDED CORE BIOPSY OF RIGHT TIBIAL LESION MEDICATIONS: Fentanyl 50 mcg ANESTHESIA/SEDATION: The patient's level of consciousness and vital signs were monitored continuously by radiology nursing throughout the procedure under my direct supervision.  FLUOROSCOPY TIME:  None COMPLICATIONS: None immediate. PROCEDURE: Informed consent was obtained for CT-guided biopsy. A timeout was performed prior to the initiation of the procedure. Patient was placed supine on the CT scanner. Images through the right lower leg were obtained. The permeative lesion involving the anterior right tibia was identified and targeted. Overlying skin was prepped with chlorhexidine and sterile field was created. Skin was anesthetized with 1% lidocaine. Small incision was made. Initially, 17  gauge coaxial needle was directed into the lesion and attempted to obtain a core biopsy with an 18 gauge BioPince needle. This needle was difficult to remove from the bone and only a small amount of soft tissue was removed. Therefore, an 11 gauge bone needle was directed towards the lesion and 14 gauge bone biopsy needle was advanced through the 11 gauge needle into the bone. Two core biopsies were obtained of the bone and placed in saline. Coaxial needle was removed. Follow-up imaging was obtained. Bandage placed over the puncture site. FINDINGS: Again noted is a permeative lesion with destruction of the anterior cortex in the mid tibia. There is surrounding abnormal soft tissue. Small amount of soft tissue and 2 small bone core biopsies were obtained. IMPRESSION: CT-guided core biopsy of a lesion in the anterior right tibia. Electronically Signed   By: Markus Daft M.D.   On: 05/29/2022 16:10     Assessment/Plan: MRSA complicated uti = continue on vancomycin for the time being. Can change to doxycycline '100mg'$  po bid ( to take on full stomach) on discharge to finish out course of treatment of 10  days ( currently of day 5)  Bone biopsy of right tibial lesion = cultures thus far negative, will wait to hear about pathology interpretation to see if this is presentation of osteomyelitis vs. Other process  [facility] for Infectious Diseases Pager: [phone]  05/30/2022, 3:21 PM

## 2022-05-31 DIAGNOSIS — N390 Urinary tract infection, site not specified: Secondary | ICD-10-CM | POA: Diagnosis not present

## 2022-05-31 DIAGNOSIS — A419 Sepsis, unspecified organism: Secondary | ICD-10-CM | POA: Diagnosis not present

## 2022-05-31 LAB — GLUCOSE, CAPILLARY
Glucose-Capillary: 115 mg/dL — ABNORMAL HIGH (ref 70–99)
Glucose-Capillary: 117 mg/dL — ABNORMAL HIGH (ref 70–99)
Glucose-Capillary: 136 mg/dL — ABNORMAL HIGH (ref 70–99)
Glucose-Capillary: 144 mg/dL — ABNORMAL HIGH (ref 70–99)
Glucose-Capillary: 149 mg/dL — ABNORMAL HIGH (ref 70–99)
Glucose-Capillary: 150 mg/dL — ABNORMAL HIGH (ref 70–99)

## 2022-05-31 LAB — TYPE AND SCREEN
ABO/RH(D): A POS
Antibody Screen: NEGATIVE
Unit division: 0

## 2022-05-31 LAB — VANCOMYCIN, PEAK: Vancomycin Pk: 37 ug/mL (ref 30–40)

## 2022-05-31 LAB — CBC
HCT: 26.6 % — ABNORMAL LOW (ref 36.0–46.0)
Hemoglobin: 8.4 g/dL — ABNORMAL LOW (ref 12.0–15.0)
MCH: 26.7 pg (ref 26.0–34.0)
MCHC: 31.6 g/dL (ref 30.0–36.0)
MCV: 84.4 fL (ref 80.0–100.0)
Platelets: 215 10*3/uL (ref 150–400)
RBC: 3.15 MIL/uL — ABNORMAL LOW (ref 3.87–5.11)
RDW: 15.7 % — ABNORMAL HIGH (ref 11.5–15.5)
WBC: 10.7 10*3/uL — ABNORMAL HIGH (ref 4.0–10.5)
nRBC: 0 % (ref 0.0–0.2)

## 2022-05-31 LAB — SURGICAL PATHOLOGY

## 2022-05-31 LAB — TSH: TSH: 4.698 u[IU]/mL — ABNORMAL HIGH (ref 0.350–4.500)

## 2022-05-31 LAB — BPAM RBC
Blood Product Expiration Date: 202403022359
ISSUE DATE / TIME: 202402071740
Unit Type and Rh: 6200

## 2022-05-31 LAB — VANCOMYCIN, TROUGH: Vancomycin Tr: 8 ug/mL — ABNORMAL LOW (ref 15–20)

## 2022-05-31 MED ORDER — ADULT MULTIVITAMIN LIQUID CH
15.0000 mL | Freq: Every day | ORAL | Status: DC
  Administered 2022-05-31 – 2022-06-01 (×2): 15 mL via ORAL
  Filled 2022-05-31 (×4): qty 15

## 2022-05-31 MED ORDER — METOPROLOL TARTRATE 25 MG PO TABS
12.5000 mg | ORAL_TABLET | Freq: Two times a day (BID) | ORAL | Status: DC
  Administered 2022-05-31 – 2022-06-06 (×12): 12.5 mg via ORAL
  Filled 2022-05-31 (×13): qty 1

## 2022-05-31 MED ORDER — FERROUS SULFATE 325 (65 FE) MG PO TABS
325.0000 mg | ORAL_TABLET | Freq: Every day | ORAL | Status: DC
  Administered 2022-06-03: 325 mg via ORAL
  Filled 2022-05-31: qty 1

## 2022-05-31 MED ORDER — SODIUM CHLORIDE 0.9 % IV SOLN
250.0000 mg | Freq: Every day | INTRAVENOUS | Status: AC
  Administered 2022-05-31 – 2022-06-01 (×2): 250 mg via INTRAVENOUS
  Filled 2022-05-31 (×2): qty 20

## 2022-05-31 NOTE — Progress Notes (Signed)
Pharmacy Antibiotic Note  Brandy Wheeler is a 89 y.o. female admitted on 05/23/2022 with  MRSA UTI .  Pharmacy has been consulted for Vanco dosing.  ID:  - MRSA complicated UTI = continue on vanc for time being. Can switch to doxy 100 mg po BID (to take on full stomach) on discharge to finish out course of treatment of 10 days (currently on day 6) - TTE negative for vegetation  - WBCs elevated, afebrile  Antimicrobials this admission: 1/31 CTX x 1 1/31 cefepime>> 2/3 2/3 Vancomycin >>   Dose adjustments: 2/8: Trough 8, Peak 37 = AUC 468 (Goal 400-550)  Microbiology results: 1/31 UCx:  > 100k MRSA, 60k strep mitis/oralis F 1/31 BCx: NGF 2/1 resp panel neg 2/1 procalcitonin neg 2/6 bone biopsy: no growth to date  Plan: Con't same Vanc 1g then 750 mg IV q24h  (AUC 478)    Height: '5\' 3"'$  (160 cm) Weight: 58.1 kg (128 lb) IBW/kg (Calculated) : 52.4  Temp (24hrs), Avg:98.2 F (36.8 C), Min:97.8 F (36.6 C), Max:98.7 F (37.1 C)  Recent Labs  Lab 05/24/22 2346 05/25/22 0443 05/26/22 1109 05/27/22 0449 05/27/22 0805 05/28/22 0555 05/29/22 0540 05/30/22 0517 05/31/22 0602 05/31/22 1025 05/31/22 1158  WBC  --    < >  --   --  11.0* 11.3* 11.5* 11.0* 10.7*  --   --   CREATININE  --    < > 0.89 0.68  --  0.68 0.68 0.59  --   --   --   LATICACIDVEN 0.9  --   --   --   --   --   --   --   --   --   --   VANCOTROUGH  --   --   --   --   --   --   --   --   --  8*  --   VANCOPEAK  --   --   --   --   --   --   --   --   --   --  37   < > = values in this interval not displayed.    Estimated Creatinine Clearance: 40.2 mL/min (by C-G formula based on SCr of 0.59 mg/dL).    No Known Allergies  [name] S. Alford Highland, PharmD, BCPS Clinical Staff Pharmacist [email or web address]  Eilene Ghazi Stillinger 05/31/2022 1:00 PM

## 2022-05-31 NOTE — Progress Notes (Signed)
Physical Therapy Treatment Patient Details Name: Brandy Wheeler MRN: [id number] DOB: [birth year] Today's Date: 05/31/2022   History of Present Illness Pt is 89 y.o. female who presented to the ED 05/23/22 from SNF for evaluation of poor oral intake & weakness. Dx of sepsis, UTI.  Hospital course remarkable for persistent altered mental status, severe hyperglycemia, right lower extremity pain with suspicion of malignancy versus osteomyelitis s/p bone biopsy on 2/6. Pt with medical history significant for bladder CA s/p robotic cystectomy & ileal conduit diversion 05/09/2022, anemia, L tib/fib fx early January 2024 with NWB (pt has cast)    PT Comments    Pt received in bed, soiled- assisted NT in bed mobility for cleaning/pericare, then focused on functional ther ex for BLEs this session within wt bearing precautions. Needed some encouragement to perform exercises actively and up to MinA for gravity resisted exercises R LE/ModA for gravity resisted exercises L LE. Left in bed positioned to comfort with all needs met, will continue efforts and continue to recommend SNF.   Recommendations for follow up therapy are one component of a multi-disciplinary discharge planning process, led by the attending physician.  Recommendations may be updated based on patient status, additional functional criteria and insurance authorization.  Follow Up Recommendations  Skilled nursing-short term rehab (<3 hours/day) Can patient physically be transported by private vehicle: No   Assistance Recommended at Discharge Frequent or constant Supervision/Assistance  Patient can return home with the following Two people to help with walking and/or transfers;A lot of help with bathing/dressing/bathroom;Assistance with cooking/housework;Assist for transportation;Help with stairs or ramp for entrance   Equipment Recommendations  None recommended by PT    Recommendations for Other Services       Precautions / Restrictions  Precautions Precautions: Fall Precaution Comments: NWB L LE and R LE buckling with attempts to stand Required Braces or Orthoses: Splint/Cast Splint/Cast: L LE cast Restrictions Weight Bearing Restrictions: Yes LLE Weight Bearing: Non weight bearing     Mobility  Bed Mobility Overal bed mobility: Needs Assistance Bed Mobility: Rolling Rolling: Min assist         General bed mobility comments: rolled for pericare and placement of new linens, otherwise focused on ther ex    Transfers                   General transfer comment: DNT today    Ambulation/Gait               General Gait Details: unsafe to attempt at this time   Stairs             Wheelchair Mobility    Modified Rankin (Stroke Patients Only)       Balance Overall balance assessment: Needs assistance Sitting-balance support: Feet supported, No upper extremity supported Sitting balance-Leahy Scale: Good     Standing balance support: Bilateral upper extremity supported, Reliant on assistive device for balance, During functional activity Standing balance-Leahy Scale: Poor                              Cognition Arousal/Alertness: Awake/alert Behavior During Therapy: WFL for tasks assessed/performed, Flat affect Overall Cognitive Status: Impaired/Different from baseline                                 General Comments: able to participate in PT with simple commands and less intense multimodal cues  Exercises      General Comments        Pertinent Vitals/Pain Pain Assessment Pain Assessment: Faces Faces Pain Scale: Hurts little more Pain Location: L ankle with movement Pain Descriptors / Indicators: Discomfort Pain Intervention(s): Limited activity within patient's tolerance    Home Living                          Prior Function            PT Goals (current goals can now be found in the care plan section) Acute Rehab PT  Goals Patient Stated Goal: REgain IND PT Goal Formulation: Patient unable to participate in goal setting Time For Goal Achievement: 06/08/22 Potential to Achieve Goals: Fair Progress towards PT goals: Progressing toward goals    Frequency    Min 2X/week      PT Plan Current plan remains appropriate    Co-evaluation              AM-PAC PT "6 Clicks" Mobility   Outcome Measure  Help needed turning from your back to your side while in a flat bed without using bedrails?: A Little Help needed moving from lying on your back to sitting on the side of a flat bed without using bedrails?: A Little Help needed moving to and from a bed to a chair (including a wheelchair)?: Total Help needed standing up from a chair using your arms (e.g., wheelchair or bedside chair)?: Total Help needed to walk in hospital room?: Total Help needed climbing 3-5 steps with a railing? : Total 6 Click Score: 10    End of Session   Activity Tolerance: Patient tolerated treatment well Patient left: in bed;with call bell/phone within reach;with bed alarm set;with family/visitor present Nurse Communication: Mobility status PT Visit Diagnosis: Difficulty in walking, not elsewhere classified (R26.2);Pain Pain - Right/Left: Left Pain - part of body: Ankle and joints of foot     Time: 1779-3903 PT Time Calculation (min) (ACUTE ONLY): 18 min  Charges:  $Therapeutic Activity: 8-22 mins                    Deniece Ree PT DPT PN2

## 2022-05-31 NOTE — Progress Notes (Signed)
PROGRESS NOTE  Brandy Wheeler  GGY:[id number] DOB: 1931-10-03 DOA: 05/23/2022 PCP: Pcp, No   Brief Narrative: Patient is 89 year old female with history of bladder cancer status post robotic cystectomy, ileal conduit diversion, GERD, anemia who presented for the evaluation of altered mental status.  She was found to have sepsis secondary to UTI.  Started on antibiotics.  Hospital course remarkable for persistent altered mental status, severe hyperglycemia, right lower extremity pain with suspicion of malignancy versus osteomyelitis.  ID consulted and following.  Underwent CT-guided bone biopsy of right anterior bone lesion by IR on 2/6.Pending biopsy report  Assessment & Plan:  Principal Problem:   Sepsis due to urinary tract infection ([place]) Active Problems:   Bladder cancer (H[facility])   Hypercalcemia   Hyperkalemia   Normocytic anemia   Acute metabolic encephalopathy   Protein-calorie malnutrition, severe   Hypokalemia   Acute cystitis with hematuria   Acute metabolic encephalopathy: Unclear etiology.  Initially thought to be from hypercalcemia  which has resolved.  CT head did not show any acute intracranial abnormalities.  MRI did not show any acute findings, showed chronic microvascular changes, chronic infarcts.  Minimize narcotics, sedatives. Mental status has  improved and she is mostly oriented now.  Severe sepsis/UTI: Present on admission.  Procalcitonin negative.  Urine culture showed MRSA and Streptococcus mitis/oralis.  Blood cultures have not shown any growth.  ID following.  Continue vancomycin. History of recent urological procedure.  Has mild leukocytosis.  Right lower extremity lesion/right ankle/leg pain: CT tibia/fibula suggested possible osteomyelitis.  Case was discussed with on-call for orthopedics, Dr. Grandville Silos who recommended MRI versus CT-guided biopsy.  MRI tibia/fibula showed widespread osseous abnormalities throughout both lower legs with suspicion of metastatic  disease / multiple myeloma.  Multifocal osteomyelitis also possible.  TTE did not show any vegetation.  Underwent IR guided bone biopsy/culture.  Will follow-up cytology, culture( NGTD).  Aerobic/anaerobic culture did not show any organism Elevated alkaline phosphatase likely from bone lesions  Hypercalcemia: Calcium of 12.3 on admission.  Vitamin D level normal.  PT is low.  Given Zometa, calcitonin.  Calcium level currently stable.  PTHrP pending.  Normocytic anemia: Hemoglobin dropped to the range of 7.  Low iron as per iron studies. S/p a unit of blood transfusion.  FOBT not collected yet.  No evidence of acute blood loss.  Being given IV iron.  Left tibia/fibula fracture: Follows with orthopedic surgery, has casted left leg, non weightbearing on left side.  PT/OT recommending SNF on discharge.  Oligometastatic bladder cancer: Follows with oncology and urology.  Recent history of robotic cystectomy with node dissection and conduit diversion  Severe protein calorie malnutrition: Nutrition is following  GERD: Continue Protonix  Goals of care: Elderly patient with multiple comorbidities, history of bladder cancer now with possible another malignancy.  Overall prognosis is poor.  Goals of care discussed with patient and  daughter. daughter says her mom is not in a good state of mind and she makes a decision for her and she wants to put her as full code.  Palliative care following.  PT/OT recommending SNF on discharge     Nutrition Problem: Severe Malnutrition Etiology: chronic illness, cancer and cancer related treatments    DVT prophylaxis:heparin injection 5,000 Units Start: 05/29/22 2200     Code Status: Full Code  Family Communication: Called and discussed with daughter on phone on 2/7  Patient status: Inpatient  Patient is from : Home  Anticipated discharge to:SNF  Estimated DC date:not sure, pending biopsy  report   Consultants: ID  Procedures: Bone  biopsy  Antimicrobials:  Anti-infectives (From admission, onward)    Start     Dose/Rate Route Frequency Ordered Stop   05/27/22 1000  vancomycin (VANCOREADY) IVPB 750 mg/150 mL        750 mg 150 mL/hr over 60 Minutes Intravenous Every 24 hours 05/26/22 1147     05/26/22 1115  vancomycin (VANCOCIN) IVPB 1000 mg/200 mL premix        1,000 mg 200 mL/hr over 60 Minutes Intravenous  Once 05/26/22 1022 05/26/22 1241   05/23/22 2000  ceFEPIme (MAXIPIME) 2 g in sodium chloride 0.9 % 100 mL IVPB  Status:  Discontinued        2 g 200 mL/hr over 30 Minutes Intravenous Every 12 hours 05/23/22 1935 05/26/22 1002   05/23/22 1930  ceFEPIme (MAXIPIME) 2 g in sodium chloride 0.9 % 100 mL IVPB  Status:  Discontinued        2 g 200 mL/hr over 30 Minutes Intravenous  Once 05/23/22 1923 05/23/22 1935   05/23/22 1615  cefTRIAXone (ROCEPHIN) 2 g in sodium chloride 0.9 % 100 mL IVPB        2 g 200 mL/hr over 30 Minutes Intravenous  Once 05/23/22 1614 05/23/22 1740       Subjective: Patient seen and examined at bedside today.  Overall she looks comfortable.  Alert and awake and mostly oriented.  Knows that today is Thursday.  Denies any nausea, vomiting or abdominal pain.  Complains of some pain on the right lower extremity.  On room air  Objective: Vitals:   05/30/22 2021 05/31/22 0015 05/31/22 0451 05/31/22 0500  BP: (!) 148/62 (!) 146/71 (!) 147/84   Pulse: (!) 103 (!) 106 (!) 105   Resp: '16 18 18   '$ Temp: 98.3 F (36.8 C) 97.8 F (36.6 C) 98.7 F (37.1 C)   TempSrc: Oral Oral Oral   SpO2: 98% 97% 98%   Weight:    58.1 kg  Height:        Intake/Output Summary (Last 24 hours) at 05/31/2022 1204 Last data filed at 05/31/2022 0932 Gross per 24 hour  Intake 597.92 ml  Output 1450 ml  Net -852.08 ml   Filed Weights   05/29/22 0652 05/30/22 0500 05/31/22 0500  Weight: 54.5 kg 55.5 kg 58.1 kg    Examination:   General exam: Overall comfortable, not in distress, weak, deconditioned HEENT:  PERRL Respiratory system:  no wheezes or crackles  Cardiovascular system: Mild sinus tachycardia Gastrointestinal system: Abdomen is nondistended, soft and nontender. Central nervous system: Alert and oriented Extremities: Trace lower extremity edema, no clubbing ,no cyanosis, cast on the left leg, biopsy 1 on the right leg Skin: No rashes, no ulcers,no icterus     Data Reviewed: I have personally reviewed following labs and imaging studies  CBC: Recent Labs  Lab 05/27/22 0805 05/28/22 0555 05/29/22 0540 05/30/22 0517 05/30/22 2215 05/31/22 0602  WBC 11.0* 11.3* 11.5* 11.0*  --  10.7*  HGB 8.0* 7.9* 7.4* 7.1* 8.4* 8.4*  HCT 27.1* 25.6* 24.6* 23.5* 26.6* 26.6*  MCV 91.2 89.2 89.1 88.3  --  84.4  PLT 296 265 243 225  --  073   Basic Metabolic Panel: Recent Labs  Lab 05/26/22 1109 05/26/22 1743 05/27/22 0449 05/28/22 0555 05/29/22 0540 05/30/22 0517  NA 147* 143 142 139 139 136  K 4.3  --  3.8 3.3* 3.8 3.5  CL 120*  --  116* 113*  116* 111  CO2 21*  --  21* 19* 19* 18*  GLUCOSE 129*  --  164* 157* 148* 145*  BUN 36*  --  27* '23 22 22  '$ CREATININE 0.89  --  0.68 0.68 0.68 0.59  CALCIUM 9.1  --  8.9 8.3* 7.9* 7.5*     Recent Results (from the past 240 hour(s))  Urine Culture (for pregnant, neutropenic or urologic patients or patients with an indwelling urinary catheter)     Status: Abnormal   Collection Time: 05/23/22  3:50 PM   Specimen: Urine, Clean Catch  Result Value Ref Range Status   Specimen Description   Final    URINE, CLEAN CATCH Performed at [facility] [place] [address] [place], [place] [address]    Special Requests   Final    NONE Performed at [facility], [place] [address], [place], [place] [address]    Culture (A)  Final    >=100,000 COLONIES/mL METHICILLIN RESISTANT STAPHYLOCOCCUS AUREUS 60,000 COLONIES/mL STREPTOCOCCUS MITIS/ORALIS    Report Status 05/26/2022 FINAL  Final   Organism ID, Bacteria METHICILLIN  RESISTANT STAPHYLOCOCCUS AUREUS (A)  Final      Susceptibility   Methicillin resistant staphylococcus aureus - MIC*    CIPROFLOXACIN >=8 RESISTANT Resistant     GENTAMICIN <=0.5 SENSITIVE Sensitive     NITROFURANTOIN <=16 SENSITIVE Sensitive     OXACILLIN >=4 RESISTANT Resistant     TETRACYCLINE <=1 SENSITIVE Sensitive     VANCOMYCIN 1 SENSITIVE Sensitive     TRIMETH/SULFA <=10 SENSITIVE Sensitive     CLINDAMYCIN <=0.25 SENSITIVE Sensitive     RIFAMPIN <=0.5 SENSITIVE Sensitive     Inducible Clindamycin NEGATIVE Sensitive     * >=100,000 COLONIES/mL METHICILLIN RESISTANT STAPHYLOCOCCUS AUREUS  Culture, blood (Routine X 2) w Reflex to ID Panel     Status: None   Collection Time: 05/23/22  4:00 PM   Specimen: BLOOD  Result Value Ref Range Status   Specimen Description   Final    BLOOD PORTA CATH Performed at [place] [address] [place], [place] [address]    Special Requests   Final    BOTTLES DRAWN AEROBIC AND ANAEROBIC Blood Culture results may not be optimal due to an inadequate volume of blood received in culture bottles Performed at [facility] [address], [place], [place] [address]    Culture   Final    NO GROWTH 5 DAYS Performed at [facility], [place] [address], [place],  [address]    Report Status 05/28/2022 FINAL  Final  Resp panel by RT-PCR (RSV, Flu A&B, Covid) Anterior Nasal Swab     Status: None   Collection Time: 05/23/22  4:02 PM   Specimen: Anterior Nasal Swab  Result Value Ref Range Status   SARS Coronavirus 2 by RT PCR NEGATIVE NEGATIVE Final    Comment: (NOTE) SARS-CoV-2 target nucleic acids are NOT DETECTED.  The SARS-CoV-2 RNA is generally detectable in upper respiratory specimens during the acute phase of infection. The lowest concentration of SARS-CoV-2 viral copies this assay can detect is 138 copies/mL. A negative result does not preclude SARS-Cov-2 infection and should not be used as  the sole basis for treatment or other patient management decisions. A negative result may occur with  improper specimen collection/handling, submission of specimen other than nasopharyngeal swab, presence of viral mutation(s) within the areas targeted by this assay, and inadequate number of viral copies(<138 copies/mL). A negative  result must be combined with clinical observations, patient history, and epidemiological information. The expected result is Negative.  Fact Sheet for Patients:  EntrepreneurPulse.com.au  Fact Sheet for Healthcare Providers:  IncredibleEmployment.be  This test is no t yet approved or cleared by the [place] FDA and  has been authorized for detection and/or diagnosis of SARS-CoV-2 by FDA under an Emergency Use Authorization (EUA). This EUA will remain  in effect (meaning this test can be used) for the duration of the COVID-19 declaration under Section 564(b)(1) of the Act, 21 U.S.C.section 360bbb-3(b)(1), unless the authorization is terminated  or revoked sooner.       Influenza A by PCR NEGATIVE NEGATIVE Final   Influenza B by PCR NEGATIVE NEGATIVE Final    Comment: (NOTE) The Xpert Xpress SARS-CoV-2/FLU/RSV plus assay is intended as an aid in the diagnosis of influenza from Nasopharyngeal swab specimens and should not be used as a sole basis for treatment. Nasal washings and aspirates are unacceptable for Xpert Xpress SARS-CoV-2/FLU/RSV testing.  Fact Sheet for Patients: EntrepreneurPulse.com.au  Fact Sheet for Healthcare Providers: IncredibleEmployment.be  This test is not yet approved or cleared by the [place] FDA and has been authorized for detection and/or diagnosis of SARS-CoV-2 by FDA under an Emergency Use Authorization (EUA). This EUA will remain in effect (meaning this test can be used) for the duration of the COVID-19 declaration under Section 564(b)(1) of  the Act, 21 U.S.C. section 360bbb-3(b)(1), unless the authorization is terminated or revoked.     Resp Syncytial Virus by PCR NEGATIVE NEGATIVE Final    Comment: (NOTE) Fact Sheet for Patients: EntrepreneurPulse.com.au  Fact Sheet for Healthcare Providers: IncredibleEmployment.be  This test is not yet approved or cleared by the [place] FDA and has been authorized for detection and/or diagnosis of SARS-CoV-2 by FDA under an Emergency Use Authorization (EUA). This EUA will remain in effect (meaning this test can be used) for the duration of the COVID-19 declaration under Section 564(b)(1) of the Act, 21 U.S.C. section 360bbb-3(b)(1), unless the authorization is terminated or revoked.  Performed at [facility], [place] [address] [place], [place] [address]   Culture, blood (Routine X 2) w Reflex to ID Panel     Status: None   Collection Time: 05/23/22  9:47 PM   Specimen: BLOOD  Result Value Ref Range Status   Specimen Description   Final    BLOOD BLOOD RIGHT HAND Performed at [place] [address] [place], [place] [address]    Special Requests   Final    BOTTLES DRAWN AEROBIC ONLY Blood Culture results may not be optimal due to an inadequate volume of blood received in culture bottles Performed at [facility] [address] [place], [place] [address]    Culture   Final    NO GROWTH 5 DAYS Performed at [facility], [place] [address], [place], [place] [address]    Report Status 05/28/2022 FINAL  Final  Respiratory (~20 pathogens) panel by PCR     Status: None   Collection Time: 05/24/22  6:39 PM   Specimen: Nasopharyngeal Swab; Respiratory  Result Value Ref Range Status   Adenovirus NOT DETECTED NOT DETECTED Final   Coronavirus 229E NOT DETECTED NOT DETECTED Final    Comment: (NOTE) The Coronavirus on the Respiratory Panel, DOES NOT test for the novel   Coronavirus (2019 nCoV)    Coronavirus HKU1 NOT DETECTED NOT DETECTED Final   Coronavirus NL63 NOT DETECTED NOT  DETECTED Final   Coronavirus OC43 NOT DETECTED NOT DETECTED Final   Metapneumovirus NOT DETECTED NOT DETECTED Final   Rhinovirus / Enterovirus NOT DETECTED NOT DETECTED Final   Influenza A NOT DETECTED NOT DETECTED Final   Influenza B NOT DETECTED NOT DETECTED Final   Parainfluenza Virus 1 NOT DETECTED NOT DETECTED Final   Parainfluenza Virus 2 NOT DETECTED NOT DETECTED Final   Parainfluenza Virus 3 NOT DETECTED NOT DETECTED Final   Parainfluenza Virus 4 NOT DETECTED NOT DETECTED Final   Respiratory Syncytial Virus NOT DETECTED NOT DETECTED Final   Bordetella pertussis NOT DETECTED NOT DETECTED Final   Bordetella Parapertussis NOT DETECTED NOT DETECTED Final   Chlamydophila pneumoniae NOT DETECTED NOT DETECTED Final   Mycoplasma pneumoniae NOT DETECTED NOT DETECTED Final    Comment: Performed at [facility], [place] [address], [place], [place] [address]  Aerobic/Anaerobic Culture w Gram Stain (surgical/deep wound)     Status: None (Preliminary result)   Collection Time: 05/29/22  3:10 PM   Specimen: Bone; Tissue  Result Value Ref Range Status   Specimen Description   Final    BONE BIOPSY TIBIA RIGHT Performed at [facility], [place] [address], [place], [place] [address]    Special Requests   Final    NONE Performed at [facility], [place] [address] [place], [place] [address]    Gram Stain NO WBC SEEN NO ORGANISMS SEEN   Final   Culture   Final    NO GROWTH 2 DAYS send results to dr. Baxter Flattery and Dr. Lonny Prude  Performed at [facility], [place] [address] [place], [place] [address]    Report Status PENDING  Incomplete     Radiology Studies: CT BONE TROCAR/NEEDLE BIOPSY SUPERFICIAL  Result Date: 05/29/2022 INDICATION: 89 year-old with bone lesions including a permeative lesion involving the anterior right tibia. EXAM: CT-GUIDED CORE BIOPSY  OF RIGHT TIBIAL LESION MEDICATIONS: Fentanyl 50 mcg ANESTHESIA/SEDATION: The patient's level of consciousness and vital signs were monitored continuously by radiology nursing throughout the procedure under my direct supervision. FLUOROSCOPY TIME:  None COMPLICATIONS: None immediate. PROCEDURE: Informed consent was obtained for CT-guided biopsy. A timeout was performed prior to the initiation of the procedure. Patient was placed supine on the CT scanner. Images through the right lower leg were obtained. The permeative lesion involving the anterior right tibia was identified and targeted. Overlying skin was prepped with chlorhexidine and sterile field was created. Skin was anesthetized with 1% lidocaine. Small incision was made. Initially, 17 gauge coaxial needle was directed into the lesion and attempted to obtain a core biopsy with an 18 gauge BioPince needle. This needle was difficult to remove from the bone and only a small amount of soft tissue was removed. Therefore, an 11 gauge bone needle was directed towards the lesion and 14 gauge bone biopsy needle was advanced through the 11 gauge needle into the bone. Two core biopsies were obtained of the bone and placed in saline. Coaxial needle was removed. Follow-up imaging was obtained. Bandage placed over the puncture site. FINDINGS: Again noted is a permeative lesion with destruction of the anterior cortex in the mid tibia. There is surrounding abnormal soft tissue. Small amount of soft tissue and 2 small bone core biopsies were obtained. IMPRESSION: CT-guided core biopsy of a lesion in the anterior right tibia. Electronically Signed   By: Markus Daft M.D.   On: 05/29/2022 16:10    Scheduled Meds:  (feeding supplement) PROSource  Plus  30 mL Oral TID BM   Chlorhexidine Gluconate Cloth  6 each Topical Daily   [START ON 06/03/2022] ferrous sulfate  325 mg Oral Q breakfast   heparin  5,000 Units Subcutaneous Q8H   pantoprazole  40 mg Oral Daily   sodium chloride  flush  3 mL Intravenous Q12H   Continuous Infusions:  dextrose 5 % and 0.45% NaCl 50 mL/hr at 05/31/22 0819   ferric gluconate (FERRLECIT) IVPB 250 mg (05/31/22 1203)   vancomycin 750 mg (05/31/22 1026)     LOS: 8 days   Shelly Coss, MD [facility] P2/11/2022, 12:04 PM

## 2022-05-31 NOTE — Consult Note (Signed)
Consultation Note Date: 05/31/2022   Patient Name: Brandy Wheeler  DOB: June 11, 1931  MRN: [id number]  Age / Sex: 89 y.o., female  PCP: Pcp, No Referring Physician: Shelly Coss, MD  Reason for Consultation: Establishing goals of care  HPI/Patient Profile: 89 y.o. female   admitted on 05/23/2022    Clinical Assessment and Goals of Care: D47-year-old lady who in January 17 underwent radical cystectomy and ileal conduit urinary diversion performed by Dr. Bess Harvest for advanced urothelial bladder cancer.  She was sent to skilled nursing facility, she is admitted with sepsis secondary to urinary tract infection, hospital course complicated by right lower extremity pain, fluctuating acute metabolic encephalopathy.  Infectious disease following. Remains admitted to hospital medicine service so, has left tibia/fibula fracture casted left leg and nonweightbearing on left side.  PT OT recommending SNF on discharge.  Patient remains on antibiotics with infectious disease specialist following closely.  Patient underwent IR guided bone biopsy/culture results pending after MRI tibia/fibula showed widespread osseous abnormalities throughout both lower legs suspicious for metastatic disease/multiple myeloma/possible multifocal osteomyelitis.  Surface echocardiogram without any vegetation. Palliative care consult requested received.  Chart reviewed.  Patient seen and examined.  Patient is resting comfortably in bed.  She just had a bowel movement.  She denies any pain or discomfort currently. Palliative medicine is specialized medical care for people living with serious illness. It focuses on providing relief from the symptoms and stress of a serious illness. The goal is to improve quality of life for both the patient and the family. Goals of care: Broad aims of medical therapy in relation to the patient's values and preferences. Our aim is to  provide medical care aimed at enabling patients to achieve the goals that matter most to them, given the circumstances of their particular medical situation and their constraints.   Discussed with patient.  Call placed and also discussed with daughter.  See below. NEXT OF KIN Daughter Brandy Wheeler at [phone].  Discussion/SUMMARY OF RECOMMENDATIONS   #1.  Full code/full scope.  Patient wishes to continue with full code.  Call placed and also discussed with daughter Brandy Hawking.  Patient wishes to continue further discussions with regards to DNR recommendation with her daughter at a later time. 2.  Goals of care discussions.  Recommend addition of palliative services at patient's current facility on discharge.  Patient's daughter wants Dr. Irene Limbo involved if biopsy results reveal metastatic involvement. Continue current mode of care Thank you for the consult.  Code Status/Advance Care Planning: Full code   Symptom Management:     Palliative Prophylaxis:  Delirium Protocol  Additional Recommendations (Limitations, Scope, Preferences): Full Scope Treatment  Psycho-social/Spiritual:  Desire for further Chaplaincy support:yes Additional Recommendations: Caregiving  Support/Resources  Prognosis:  Unable to determine  Discharge Planning:  recommend addition of palliative services at [facility] on discharge.        Primary Diagnoses: Present on Admission:  Sepsis due to urinary tract infection [facility])  Bladder cancer ([place])   I have reviewed the medical record, interviewed the  patient and family, and examined the patient. The following aspects are pertinent.  Past Medical History:  Diagnosis Date   Bladder cancer ([place])    Diverticulitis    Fracture of right fibula 10/10/2013   closed   GERD (gastroesophageal reflux disease)    History of hiatal hernia    Hypertension    Social History   Socioeconomic History   Marital status: Divorced    Spouse name: Not on file    Number of children: Not on file   Years of education: Not on file   Highest education level: Not on file  Occupational History   Not on file  Tobacco Use   Smoking status: Never   Smokeless tobacco: Never  Vaping Use   Vaping Use: Never used  Substance and Sexual Activity   Alcohol use: Yes   Drug use: No   Sexual activity: Not on file  Other Topics Concern   Not on file  Social History Narrative   Not on file   Social Determinants of Health   Financial Resource Strain: Not on file  Food Insecurity: No Food Insecurity (05/28/2022)   Hunger Vital Sign    Worried About Running Out of Food in the Last Year: Never true    Ran Out of Food in the Last Year: Never true  Transportation Needs: No Transportation Needs (05/28/2022)   PRAPARE - Hydrologist (Medical): No    Lack of Transportation (Non-Medical): No  Physical Activity: Not on file  Stress: Not on file  Social Connections: Not on file   No family history on file. Scheduled Meds:  (feeding supplement) PROSource Plus  30 mL Oral TID BM   Chlorhexidine Gluconate Cloth  6 each Topical Daily   [START ON 06/03/2022] ferrous sulfate  325 mg Oral Q breakfast   heparin  5,000 Units Subcutaneous Q8H   pantoprazole  40 mg Oral Daily   sodium chloride flush  3 mL Intravenous Q12H   Continuous Infusions:  dextrose 5 % and 0.45% NaCl 50 mL/hr at 05/31/22 0819   ferric gluconate (FERRLECIT) IVPB     vancomycin 750 mg (05/30/22 0942)   PRN Meds:.acetaminophen **OR** acetaminophen, alum & mag hydroxide-simeth, lip balm, ondansetron **OR** ondansetron (ZOFRAN) IV, senna-docusate Medications Prior to Admission:  Prior to Admission medications   Medication Sig Start Date End Date Taking? Authorizing Provider  Cholecalciferol (VITAMIN D3 PO) Take 1 tablet by mouth in the morning.   Yes [provider]  KLOR-CON 10 10 MEQ tablet Take 20 mEq by mouth in the morning and at bedtime.   Yes [provider]  Multiple Minerals-Vitamins (BONE DENSITY BUILDER PO) Take 1 tablet by mouth See admin instructions. [employer] Bone Builder Forte tablets- Take 1 tablet by mouth in the morning with breakfast   Yes [provider]  omeprazole (PRILOSEC) 20 MG capsule Take 20 mg by mouth daily before breakfast.   Yes [provider]  oxyCODONE-acetaminophen (PERCOCET) 5-325 MG tablet Take 1 tablet by mouth every 6 (six) hours as needed for severe pain or moderate pain (post-operatively). 05/17/22  Yes [name], MD  vitamin B-12 (CYANOCOBALAMIN) 500 MCG tablet Take 500 mcg by mouth in the morning.   Yes [provider]  potassium chloride (KLOR-CON M) 10 MEQ tablet Take 2 tablets (20 mEq total) by mouth 2 (two) times daily for 7 days. Patient not taking: Reported on 05/24/2022 04/29/22 05/24/22  Molpus, Jenny Reichmann, MD   No Known Allergies  Review of Systems Denies pain Physical Exam Awake alert Resting in bed Appears are deconditioned and with mild generalized weakness Has left leg cast Abdomen is not distended Answers questions appropriately  Vital Signs: BP (!) 147/84 (BP Location: Left Arm)   Pulse (!) 105   Temp 98.7 F (37.1 C) (Oral)   Resp 18   Ht '5\' 3"'$  (1.6 m)   Wt 58.1 kg   SpO2 98%   BMI 22.67 kg/m  Pain Scale: 0-10   Pain Score: 0-No pain   SpO2: SpO2: 98 % O2 Device:SpO2: 98 % O2 Flow Rate: .O2 Flow Rate (L/min): 2 L/min  IO: Intake/output summary:  Intake/Output Summary (Last 24 hours) at 05/31/2022 0946 Last data filed at 05/31/2022 0932 Gross per 24 hour  Intake 1053.92 ml  Output 1450 ml  Net -396.08 ml    LBM: Last BM Date : 05/30/22 Baseline Weight: Weight: 63 kg Most recent weight: Weight: 58.1 kg     Palliative Assessment/Data:   PPS 50%  Time In:  9.30 Time Out:  10.30 Time Total:  60  Greater than 50%  of this time was spent counseling and coordinating care related to the above assessment and plan.  Signed by: Loistine Chance,  MD   Please contact Palliative Medicine Team phone at [phone] for questions and concerns.  For individual provider: See Shea Evans

## 2022-05-31 NOTE — Progress Notes (Signed)
PT Cancellation Note  Patient Details Name: Brandy Wheeler MRN: [id number] DOB: 29-Sep-1931   Cancelled Treatment:    Reason Eval/Treat Not Completed: Other (comment) on bedpan, will return later if time/schedule allow   Deniece Ree PT DPT PN2

## 2022-05-31 NOTE — Progress Notes (Addendum)
Speech Language Pathology Treatment: Dysphagia  Patient Details Name: Brandy Wheeler MRN: [id number] DOB: [birth year] Today's Date: 05/31/2022 Time: 1549-1600 SLP Time Calculation (min) (ACUTE ONLY): 11 min  Assessment / Plan / Recommendation Clinical Impression  Pt was seen for dysphagia treatment with her niece present. Pt's family and RN reported that the pt has been refusing the puree diet, but has been tolerating liquids and meds (smaller ones whole with thin liquids) without difficulty. Pt was reclined upon SLP's arrival and requested that positioning not be optimized due to her pain. She tolerated regular texture solids and thin liquids without immediate overt s/s of aspiration. A single delayed cough was noted once, but SLP questions its relatedness to swallowing. Mastication was [facility] and oral clearance was adequate with occasional use of a liquid wash to eliminate mild lingual residue. Pt and her family reported that her swallowing presentation appeared representative of her baseline. A regular texture diet with thin liquids is recommended at this time. SLP will follow to ensure tolerance of the advanced diet.    HPI HPI: Andreah Goheen is an 89 y.o. female who presented to ED from SNF secondary to altered mental status and was found to have evidence of sepsis secondary to UTI. Prior medical history of bladder cancer s/p robotic cystectomy and ileal conduit diversion, GERD and anemia.      SLP Plan  Continue with current plan of care      Recommendations for follow up therapy are one component of a multi-disciplinary discharge planning process, led by the attending physician.  Recommendations may be updated based on patient status, additional functional criteria and insurance authorization.    Recommendations  Diet recommendations: Regular;Thin liquid Liquids provided via: Cup;Straw Medication Administration: Whole meds with puree/crushed Supervision: Staff to assist with self  feeding Compensations: Slow rate;Small sips/bites Postural Changes and/or Swallow Maneuvers: Seated upright 90 degrees                Oral Care Recommendations: Oral care BID Follow Up Recommendations: Other (comment) (tba) SLP Visit Diagnosis: Dysphagia, unspecified (R13.10) Plan: Continue with current plan of care          Blakeley Scheier I. Hardin Negus, [place], [place] Office number [phone]  Horton Marshall  05/31/2022, 4:07 PM

## 2022-05-31 NOTE — Progress Notes (Signed)
Nutrition Follow-up  DOCUMENTATION CODES:   Severe malnutrition in context of chronic illness  INTERVENTION:   -Prosource Plus PO TID, each provides 100 kcals and 15g protein   -Multivitamin with minerals daily  NUTRITION DIAGNOSIS:   Severe Malnutrition related to chronic illness, cancer and cancer related treatments as evidenced by mild fat depletion, severe muscle depletion, percent weight loss.  Ongoing.  GOAL:   Patient will meet greater than or equal to 90% of their needs  Progressing  MONITOR:   PO intake, Supplement acceptance, Labs, Weight trends, I & O's  ASSESSMENT:   89 y.o. female with medical history significant for bladder cancer s/p robotic cystectomy and ileal conduit diversion 05/09/2022, anemia who presented to the ED from SNF for evaluation of poor oral intake and weakness.  Patient did not eat any breakfast this morning. Alert/oriented x 3. Accepting Prosource supplements.  Admission weight: 138 lbs Current weight: 128 lbs  Medications: Ferrlecit  Labs reviewed: CBGs: 115-150  Diet Order:   Diet Order             DIET - DYS 1 Room service appropriate? Yes; Fluid consistency: Thin  Diet effective now                   EDUCATION NEEDS:   Not appropriate for education at this time  Skin:  Skin Assessment: Reviewed RN Assessment  Last BM:  2/8 -type 7  Height:   Ht Readings from Last 1 Encounters:  05/29/22 '5\' 3"'$  (1.6 m)    Weight:   Wt Readings from Last 1 Encounters:  05/31/22 58.1 kg    BMI:  Body mass index is 22.67 kg/m.  Estimated Nutritional Needs:   Kcal:  1800-2000  Protein:  85-95g  Fluid:  2L/day  Clayton Bibles, MS, RD, LDN Inpatient Clinical Dietitian Contact information available via Amion

## 2022-05-31 NOTE — Consult Note (Signed)
Marland Kitchen   HEMATOLOGY/ONCOLOGY CONSULTATION NOTE  Date of Service: .06/01/2022   Patient Care Team: Pcp, No as PCP - General  CHIEF COMPLAINTS/PURPOSE OF CONSULTATION:  Newly noted metastatic urothelial carcinoma.  HISTORY OF PRESENTING ILLNESS:   Brandy Wheeler is a wonderful 89 y.o. female who has been referred to [place] by Dr  Shelly Coss MD for evaluation and management of newly diagnosed metastatic urothelial carcinoma with bone metastases.  Patient was previously managed by Dr. Zola Button MD her medical oncologist who has since left the practice.  Patient is a very sweet 89 year old woman with a history of hiatal hernia, hypertension, GERD, previous diverticulitis who was diagnosed with bladder cancer in June 2023 when she was noted to have T3 N0 high-grade urothelial carcinoma.  She is status post TURBT on October 09, 2021 with final pathology showing high-grade papillary urothelial carcinoma with lamina propria and perivesical invasion.  Patient had presented with left lower quadrant abdominal discomfort and flank pain and urinary urgency . She had a CT of the abdomen renal stone protocol on 10/03/2021 which showed profound masslike thickening of the urinary bladder highly concerning for primary urothelial Neoplasm.  This appears to be causing obstruction of the right ureterovesical junction with mild right hydronephrosis.    Neoadjuvant chemotherapy utilizing gemcitabine and cisplatin was started on November 16, 2021 and she finished 4 cycles of treatment on February 08, 2022.  She had a repeat CT scan of the chest abdomen pelvis on 03/01/2022 which showed no evidence of lymphadenopathy or metastatic disease in the chest abdomen or pelvis.  There was a 1.3 x 1.1 centimeter right middle lobe subpleural nodular consolidation thought to be infectious or inflammatory.  Patient subsequently had a robotic assisted laparoscopic complete cystectomy with ilial conduit 05/09/2022 with Dr. Tresa Moore.  Patient was  subsequently admitted on 05/23/2022 to the hospital with altered mental status from her skilled nursing facility for evaluation of poor p.o. intake and weakness. She was noted to have sepsis due to UTI in the context of her recent bladder surgery and has been started on IV antibiotics.  Urology was consulted. Patient was also noted to be hypercalcemic in the setting of dehydration and had issues with postoperative ileus.  CT abdomen and pelvis on 05/23/2022 showed postsurgical changes from her cystectomy with right-sided ilial conduit.  Moderate-sized hiatal hernia.  New 5 mm left lower lobe nodule metastatic disease cannot be ruled out.  Patient had an MRI of the brain for her altered mental status which did not show any acute new lesions or metastases. Urinary tract infection due to MRSA and Streptococcus mitis which is being managed by ID.  Patient is on vancomycin.  Patient had hypercalcemia with a calcium of 12.3 on admission which resolved with Zometa.  She was having right lower extremity pain and had a CT tibia-fibula which showed possible osteomyelitis.  CT-guided biopsy was done which unfortunately shows findings consistent with metastatic high-grade urothelial carcinoma.  She has also had issues with anemia with a hemoglobin of 7.1 which is requiring PRBC transfusion.  Oncology was consulted to weigh in on her new findings of metastatic urothelial carcinoma.   MEDICAL HISTORY:  Past Medical History:  Diagnosis Date   Bladder cancer ([place])    Diverticulitis    Fracture of right fibula 10/10/2013   closed   GERD (gastroesophageal reflux disease)    History of hiatal hernia    Hypertension     SURGICAL HISTORY: Past Surgical History:  Procedure Laterality Date  ABDOMINAL HYSTERECTOMY  1978   APPENDECTOMY     AGE 35   CYSTOSCOPY W/ URETERAL STENT PLACEMENT Right 10/09/2021   Procedure: POSSIBLE BILATERAL  RETROGRADE POSSIBLE RIGHT Melene Muller STENT;  Surgeon: Lucas Mallow, MD;  Location: [facility];  Service: Urology;  Laterality: Right;   CYSTOSCOPY WITH INJECTION N/A 05/09/2022   Procedure: CYSTOSCOPY WITH INJECTION OF INDOCYANINE GREEN DYE;  Surgeon: [name], MD;  Location: [facility];  Service: Urology;  Laterality: N/A;   DIAGNOSTIC LAPAROSCOPY  1974   IR IMAGING GUIDED PORT INSERTION  11/15/2021   LYMPH NODE DISSECTION Bilateral 05/09/2022   Procedure: PELVIC LYMPH NODE DISSECTION;  Surgeon: [name], MD;  Location: [facility];  Service: Urology;  Laterality: Bilateral;   ROBOT ASSISTED LAPAROSCOPIC COMPLETE CYSTECT ILEAL CONDUIT N/A 05/09/2022   Procedure: XI ROBOTIC ASSISTED LAPAROSCOPIC COMPLETE CYSTECT ILEAL CONDUIT;  Surgeon: [name], MD;  Location: [facility];  Service: Urology;  Laterality: N/A;  [place]   to lift uterus prior to getting pregnant   TRANSURETHRAL RESECTION OF BLADDER TUMOR Bilateral 10/09/2021   Procedure: TRANSURETHRAL RESECTION OF BLADDER TUMOR (TURBT) POSSIBLE BILATERAL RETROGRADE POSSIBLE RIGHT URETERAL STENT;  Surgeon: Lucas Mallow, MD;  Location: [facility];  Service: Urology;  Laterality: Bilateral;    SOCIAL HISTORY: Social History   Socioeconomic History   Marital status: Divorced    Spouse name: Not on file   Number of children: Not on file   Years of education: Not on file   Highest education level: Not on file  Occupational History   Not on file  Tobacco Use   Smoking status: Never   Smokeless tobacco: Never  Vaping Use   Vaping Use: Never used  Substance and Sexual Activity   Alcohol use: Yes   Drug use: No   Sexual activity: Not on file  Other Topics Concern   Not on file  Social History Narrative   Not on file   Social Determinants of Health   Financial Resource Strain: Not on file  Food Insecurity: No Food Insecurity (05/28/2022)   Hunger Vital Sign    Worried About Running Out of Food in the Last Year: Never true    Ran Out of Food in the Last Year: Never true   Transportation Needs: No Transportation Needs (05/28/2022)   PRAPARE - Hydrologist (Medical): No    Lack of Transportation (Non-Medical): No  Physical Activity: Not on file  Stress: Not on file  Social Connections: Not on file  Intimate Partner Violence: Not At Risk (05/28/2022)   Humiliation, Afraid, Rape, and Kick questionnaire    Fear of Current or Ex-Partner: No    Emotionally Abused: No    Physically Abused: No    Sexually Abused: No    FAMILY HISTORY: No family history on file.  ALLERGIES:  has No Known Allergies.  MEDICATIONS:  Current Facility-Administered Medications  Medication Dose Route Frequency Provider Last Rate Last Admin   (feeding supplement) PROSource Plus liquid 30 mL  30 mL Oral TID BM Mariel Aloe, MD   30 mL at 05/31/22 1245   acetaminophen (TYLENOL) tablet 650 mg  650 mg Oral Q6H PRN Lenore Cordia, MD       Or   acetaminophen (TYLENOL) suppository 650 mg  650 mg Rectal Q6H PRN Lenore Cordia, MD   650 mg at 05/28/22 0449   alum & mag hydroxide-simeth (MAALOX/MYLANTA) 200-200-20  MG/5ML suspension 30 mL  30 mL Oral Q4H PRN Lang Snow, NP   30 mL at 05/30/22 2202   Chlorhexidine Gluconate Cloth 2 % PADS 6 each  6 each Topical Daily Mariel Aloe, MD   6 each at 05/31/22 1203   ferric gluconate (FERRLECIT) 250 mg in sodium chloride 0.9 % 250 mL IVPB  250 mg Intravenous Daily Shelly Coss, MD 135 mL/hr at 05/31/22 1203 250 mg at 05/31/22 1203   [START ON 06/03/2022] ferrous sulfate tablet 325 mg  325 mg Oral Q breakfast Shelly Coss, MD       heparin injection 5,000 Units  5,000 Units Subcutaneous Q8H Allred, Darrell K, PA-C   5,000 Units at 05/31/22 1245   lip balm (CARMEX) ointment 1 Application  1 Application Topical PRN Mariel Aloe, MD   1 Application at [id number]   metoprolol tartrate (LOPRESSOR) tablet 12.5 mg  12.5 mg Oral BID Shelly Coss, MD   12.5 mg at 05/31/22 1245   multivitamin liquid 15  mL  15 mL Oral Daily Adhikari, Tamsen Meek, MD       ondansetron (ZOFRAN) tablet 4 mg  4 mg Oral Q6H PRN Lenore Cordia, MD       Or   ondansetron (ZOFRAN) injection 4 mg  4 mg Intravenous Q6H PRN Lenore Cordia, MD       pantoprazole (PROTONIX) EC tablet 40 mg  40 mg Oral Daily Mariel Aloe, MD   40 mg at 05/31/22 1027   senna-docusate (Senokot-S) tablet 1 tablet  1 tablet Oral QHS PRN Zada Finders R, MD       sodium chloride flush (NS) 0.9 % injection 3 mL  3 mL Intravenous Q12H Zada Finders R, MD   3 mL at 05/28/22 2123   vancomycin (VANCOREADY) IVPB 750 mg/150 mL  750 mg Intravenous Q24H Suzzanne Cloud, RPH 150 mL/hr at 05/31/22 1026 750 mg at 05/31/22 1026    REVIEW OF SYSTEMS:    10 Point review of Systems was done is negative except as noted above.  PHYSICAL EXAMINATION: ECOG PERFORMANCE STATUS: 4 - Bedbound  . Vitals:   05/31/22 0451 05/31/22 1259  BP: (!) 147/84 137/63  Pulse: (!) 105 (!) 103  Resp: 18 18  Temp: 98.7 F (37.1 C) 98.1 F (36.7 C)  SpO2: 98% 100%   Filed Weights   05/29/22 0652 05/30/22 0500 05/31/22 0500  Weight: 120 lb 1.6 oz (54.5 kg) 122 lb 6.4 oz (55.5 kg) 128 lb (58.1 kg)   .Body mass index is 22.67 kg/m.  GENERAL:alert, in no acute distress and comfortable SKIN: no acute rashes, no significant lesions EYES: conjunctiva are pink and non-injected, sclera anicteric OROPHARYNX: MMM, no exudates, no oropharyngeal erythema or ulceration NECK: supple, no JVD LYMPH:  no palpable lymphadenopathy in the cervical, axillary or inguinal regions LUNGS: clear to auscultation b/l with normal respiratory effort HEART: regular rate & rhythm ABDOMEN:  normoactive bowel sounds , non tender, not distended. Extremity: no pedal edema PSYCH: alert & oriented x 2-3 NEURO: no focal motor/sensory deficits  LABORATORY DATA:  I have reviewed the data as listed  .    Latest Ref Rng & Units 06/01/2022    9:42 AM 05/31/2022    6:02 AM 05/30/2022   10:15 PM  CBC   WBC 4.0 - 10.5 K/uL 11.8  10.7    Hemoglobin 12.0 - 15.0 g/dL 9.0  8.4  8.4   Hematocrit 36.0 - 46.0 % 28.9  26.6  26.6   Platelets 150 - 400 K/uL 231  215      .    Latest Ref Rng & Units 06/01/2022    9:42 AM 05/30/2022    5:17 AM 05/29/2022    5:40 AM  CMP  Glucose 70 - 99 mg/dL 130  145  148   BUN 8 - 23 mg/dL 13  22  22   $ Creatinine 0.44 - 1.00 mg/dL 0.53  0.59  0.68   Sodium 135 - 145 mmol/L 132  136  139   Potassium 3.5 - 5.1 mmol/L 3.0  3.5  3.8   Chloride 98 - 111 mmol/L 109  111  116   CO2 22 - 32 mmol/L 19  18  19   $ Calcium 8.9 - 10.3 mg/dL 6.7  7.5  7.9   Total Protein 6.5 - 8.1 g/dL  4.7  4.9   Total Bilirubin 0.3 - 1.2 mg/dL  0.3  0.4   Alkaline Phos 38 - 126 U/L  154  144   AST 15 - 41 U/L  26  24   ALT 0 - 44 U/L  10  10    SURGICAL PATHOLOGY CASE: [id number] PATIENT: Brandy Wheeler Surgical Pathology Report     Clinical History: Bladder cancer; bone metastasis vs osteomyelitis ([facility])     FINAL MICROSCOPIC DIAGNOSIS:  A. RIGHT ANTERIOR TIBIA, LESION, BONE BIOPSY: Metastatic urothelial carcinoma  COMMENT:  Sections show a 4 mm core of reactive trabecular bone with a desmoplastic and fibrotic marrow infiltrated by sheets of atypical large cells with variably enlarged round oval hyperchromatic nuclei with associated necrosis. Four immunohistochemical stains are performed with adequate control to elucidate the histogenesis of this tumor.  These neoplastic cells are diffusely and strongly positive for cytokeratin 7 and cytokeratin 20 and show focal positivity for p63.  The cells are negative for the pulmonary adeno marker TTF-1.  The cytohistomorphology, this immunohistochemical pattern and the patient's prior history of urothelial carcinoma supporting the above diagnosis.   GROSS DESCRIPTION:  Received fresh is a 0.4 cm in length by 0.2 cm in diameter core of tan, firm bone.  The specimen is entirely submitted in 1 block, following decalcification  in Immunocal. (K[facility], 05/29/2022).   Final Diagnosis performed by Tobin Chad, MD.   Electronically signed 05/31/2022   RADIOGRAPHIC STUDIES: I have personally reviewed the radiological images as listed and agreed with the findings in the report. CT BONE TROCAR/NEEDLE BIOPSY SUPERFICIAL  Result Date: 05/29/2022 INDICATION: 89 year-old with bone lesions including a permeative lesion involving the anterior right tibia. EXAM: CT-GUIDED CORE BIOPSY OF RIGHT TIBIAL LESION MEDICATIONS: Fentanyl 50 mcg ANESTHESIA/SEDATION: The patient's level of consciousness and vital signs were monitored continuously by radiology nursing throughout the procedure under my direct supervision. FLUOROSCOPY TIME:  None COMPLICATIONS: None immediate. PROCEDURE: Informed consent was obtained for CT-guided biopsy. A timeout was performed prior to the initiation of the procedure. Patient was placed supine on the CT scanner. Images through the right lower leg were obtained. The permeative lesion involving the anterior right tibia was identified and targeted. Overlying skin was prepped with chlorhexidine and sterile field was created. Skin was anesthetized with 1% lidocaine. Small incision was made. Initially, 17 gauge coaxial needle was directed into the lesion and attempted to obtain a core biopsy with an 18 gauge BioPince needle. This needle was difficult to remove from the bone and only a small amount of soft tissue was removed. Therefore, an 11 gauge bone needle  was directed towards the lesion and 14 gauge bone biopsy needle was advanced through the 11 gauge needle into the bone. Two core biopsies were obtained of the bone and placed in saline. Coaxial needle was removed. Follow-up imaging was obtained. Bandage placed over the puncture site. FINDINGS: Again noted is a permeative lesion with destruction of the anterior cortex in the mid tibia. There is surrounding abnormal soft tissue. Small amount of soft tissue and 2 small bone core  biopsies were obtained. IMPRESSION: CT-guided core biopsy of a lesion in the anterior right tibia. Electronically Signed   By: Markus Daft M.D.   On: 05/29/2022 16:10   MR BRAIN WO CONTRAST  Result Date: 05/28/2022 CLINICAL DATA:  Mental status change, cause. EXAM: MRI HEAD WITHOUT CONTRAST TECHNIQUE: Multiplanar, multiecho pulse sequences of the brain and surrounding structures were obtained without intravenous contrast. COMPARISON:  None Available. FINDINGS: Brain: No acute infarction, hemorrhage, hydrocephalus, extra-axial collection or mass lesion. 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 Scattered foci of T2 hyperintensity are seen within the white matter of the cerebral hemispheres, nonspecific, most likely related to chronic small vessel ischemia. Vascular: Normal flow voids. Skull and upper cervical spine: Normal marrow signal. Sinuses/Orbits: Decrease volume and opacification of the right maxillary sinus with mild lowering of the right orbital floor, suggesting silent sinus syndrome. Mild right mastoid effusion. Other: None. IMPRESSION: 1. No acute intracranial abnormality. 2. Mild chronic microvascular ischemic changes of the white matter. 3. 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 Electronically Signed   By: Pedro Earls M.D.   On: 05/28/2022 15:28   MR TIBIA FIBULA RIGHT W WO CONTRAST  Result Date: 05/28/2022 CLINICAL DATA:  Bone mass or bone pain, leg, aggressive features on x-ray. Primary tip tibial lesion on CT. History of bladder cancer. EXAM: MRI OF LOWER RIGHT EXTREMITY WITHOUT AND WITH CONTRAST TECHNIQUE: Multiplanar, multisequence MR imaging of the right lower leg was performed both before and after administration of intravenous contrast. CONTRAST:  52m GADAVIST GADOBUTROL 1 MMOL/ML IV SOLN COMPARISON:  Radiographs 05/24/2022.  CT 05/25/2022. FINDINGS: Bones/Joint/Cartilage  Both lower legs are included on the coronal images. There are extensive widespread osseous abnormalities and both lower legs. The lesion of concern in the mid right tibial diaphysis measures approximately 4.9 cm in length and is associated with permeative destruction of the cortex anteriorly, periosteal reaction and an enhancing soft tissue mass measuring approximately 2.8 x 2.5 cm transverse on image 39/20. Similar appearing permeative lesion involving the distal right fibular diaphysis extends up to 4.1 cm in length. There are numerous additional smaller lesions throughout the right tibia as well as the visualized right tarsal bones. In the left lower leg, the marrow abnormalities are more diffuse, with near complete involvement of the tibia and a large distal fibular lesion. No large joint effusions or definite pathologic fractures are identified. Ligaments No significant ligamentous abnormalities on large field-of-view imaging. Muscles and Tendons Multifocal periosteal reaction associated with the osseous lesions described above. No intramuscular masses are identified in the right lower leg. Incidental imaging of the left lower leg demonstrates extensive edema throughout the musculature. Soft tissues The soft tissue gas around the right knee seen on the recent CT is not clearly visualized. No focal soft tissue masses or fluid collections are identified aside from those associated with the permeative bone lesions. As above, there is periosteal edema and enhancement as  well a small amount of subcutaneous edema in the mid right lower leg. In the visualized left lower leg, there is more diffuse soft tissue edema as well as apparent soft tissue emphysema medially in the proximal lower leg. IMPRESSION: 1. Widespread osseous abnormalities throughout both lower legs. This pattern can be seen with metastatic disease and multiple myeloma. As suggested on CT, given previous soft tissue findings and admission for sepsis,  findings could reflect multifocal osteomyelitis. Correlate clinically and consider further systemic assessment with whole-body bone scan or PET-CT. 2. No focal soft tissue masses or fluid collections are identified in the right lower leg aside from those associated with the permeative bone lesions. 3. More extensive soft tissue abnormalities throughout the visualized left lower leg with soft tissue emphysema and edema. Electronically Signed   By: Richardean Sale M.D.   On: 05/28/2022 08:22   DG Abd Portable 1V  Result Date: 05/27/2022 CLINICAL DATA:  Abdominal tenderness, history of ileal conduit urinary diversion EXAM: PORTABLE ABDOMEN - 1 VIEW COMPARISON:  None Available. FINDINGS: Nonobstructive pattern of bowel gas. Ureteral stent catheters in expected position for ileal conduit urinary diversion. No free air in the abdomen on supine radiographs. IMPRESSION: Nonobstructive pattern of bowel gas. Ureteral stent catheters in expected position for ileal conduit urinary diversion. Electronically Signed   By: Delanna Ahmadi M.D.   On: 05/27/2022 16:18   ECHOCARDIOGRAM COMPLETE  Result Date: 05/27/2022    ECHOCARDIOGRAM REPORT   Patient Name:   Brandy Wheeler Date of Exam: 05/27/2022 Medical Rec #:  [id number]   Height:       63.0 in Accession #:    [id number]  Weight:       117.7 lb Date of Birth:  08-19-1931   BSA:          1.544 m Patient Age:    48 years    BP:           178/57 mmHg Patient Gender: F           HR:           93 bpm. Exam Location:  Inpatient Procedure: 2D Echo Indications:    bacteremia  History:        Patient has no prior history of Echocardiogram examinations.  Sonographer:    Harvie Junior Referring Phys: [id number] [place]  1. Left ventricular ejection fraction, by estimation, is 60 to 65%. The left ventricle has normal function. The left ventricle has no regional wall motion abnormalities. Indeterminate diastolic filling due to E-A fusion.  2. Right ventricular systolic  function is normal. The right ventricular size is normal.  3. No evidence of mitral valve regurgitation.  4. There is mild calcification of the aortic valve. Aortic valve regurgitation is not visualized.  5. The inferior vena cava is normal in size with greater than 50% respiratory variability, suggesting right atrial pressure of 3 mmHg. Comparison(s): No prior Echocardiogram. Conclusion(s)/Recommendation(s): Normal biventricular function without evidence of hemodynamically significant valvular heart disease. Windows challenging. No frank signs of valvular vegetations. If high concern, recommend a TEE. FINDINGS  Left Ventricle: Left ventricular ejection fraction, by estimation, is 60 to 65%. The left ventricle has normal function. The left ventricle has no regional wall motion abnormalities. The left ventricular internal cavity size was normal in size. There is  no left ventricular hypertrophy. Indeterminate diastolic filling due to E-A fusion. Right Ventricle: The right ventricular size is normal. Right ventricular systolic function is normal. Left Atrium: Left  atrial size was normal in size. Right Atrium: Right atrial size was normal in size. Pericardium: There is no evidence of pericardial effusion. Mitral Valve: No evidence of mitral valve regurgitation. Tricuspid Valve: Tricuspid valve regurgitation is not demonstrated. Aortic Valve: There is mild calcification of the aortic valve. Aortic valve regurgitation is not visualized. Aortic valve mean gradient measures 2.5 mmHg. Aortic valve peak gradient measures 4.7 mmHg. Aortic valve area, by VTI measures 3.22 cm. Pulmonic Valve: Pulmonic valve regurgitation is not visualized. Aorta: The aortic root and ascending aorta are structurally normal, with no evidence of dilitation. Venous: The inferior vena cava is normal in size with greater than 50% respiratory variability, suggesting right atrial pressure of 3 mmHg. IAS/Shunts: No atrial level shunt detected by color  flow Doppler.  LEFT VENTRICLE PLAX 2D LVIDd:         3.60 cm     Diastology LVIDs:         2.30 cm     LV e' medial:    6.96 cm/s LV PW:         0.90 cm     LV E/e' medial:  12.6 LV IVS:        0.90 cm     LV e' lateral:   12.10 cm/s LVOT diam:     1.90 cm     LV E/e' lateral: 7.2 LV SV:         54 LV SV Index:   35 LVOT Area:     2.84 cm  LV Volumes (MOD) LV vol d, MOD A2C: 59.4 ml LV vol d, MOD A4C: 77.8 ml LV vol s, MOD A2C: 19.3 ml LV vol s, MOD A4C: 31.1 ml LV SV MOD A2C:     40.1 ml LV SV MOD A4C:     77.8 ml LV SV MOD BP:      43.3 ml RIGHT VENTRICLE RV Basal diam:  2.40 cm RV Mid diam:    1.90 cm RV S prime:     17.40 cm/s TAPSE (M-mode): 1.6 cm LEFT ATRIUM             Index        RIGHT ATRIUM          Index LA diam:        2.90 cm 1.88 cm/m   RA Area:     7.82 cm LA Vol (A2C):   32.2 ml 20.86 ml/m  RA Volume:   14.40 ml 9.33 ml/m LA Vol (A4C):   21.3 ml 13.80 ml/m LA Biplane Vol: 26.6 ml 17.23 ml/m  AORTIC VALVE                    PULMONIC VALVE AV Area (Vmax):    2.71 cm     PV Vmax:       1.17 m/s AV Area (Vmean):   2.39 cm     PV Peak grad:  5.5 mmHg AV Area (VTI):     3.22 cm AV Vmax:           108.65 cm/s AV Vmean:          74.500 cm/s AV VTI:            0.168 m AV Peak Grad:      4.7 mmHg AV Mean Grad:      2.5 mmHg LVOT Vmax:         104.00 cm/s LVOT Vmean:        62.700  cm/s LVOT VTI:          0.191 m LVOT/AV VTI ratio: 1.14  AORTA Ao Root diam: 3.10 cm Ao Asc diam:  3.60 cm MITRAL VALVE MV Area (PHT): 5.13 cm     SHUNTS MV Decel Time: 148 msec     Systemic VTI:  0.19 m MV E velocity: 87.50 cm/s   Systemic Diam: 1.90 cm MV A velocity: 123.00 cm/s MV E/A ratio:  0.71 Mary Scientist, physiological signed by Phineas Inches Signature Date/Time: 05/27/2022/12:29:31 PM    Final    CT TIBIA FIBULA RIGHT WO CONTRAST  Result Date: 05/26/2022 CLINICAL DATA:  Permeative lesion in the mid tibia. History of bladder cancer. EXAM: CT OF THE LOWER RIGHT EXTREMITY WITHOUT CONTRAST TECHNIQUE: Multidetector CT  imaging of the right lower extremity was performed according to the standard protocol. RADIATION DOSE REDUCTION: This exam was performed according to the departmental dose-optimization program which includes automated exposure control, adjustment of the mA and/or kV according to patient size and/or use of iterative reconstruction technique. COMPARISON:  Radiographs 05/24/2022 FINDINGS: Bones/Joint/Cartilage CT confirms a poorly defined permeative lesion in the mid to distal diaphysis of the tibia anteriorly with a somewhat moth-eaten appearance of the cortex, substantial irregular periosteal reaction, and endosteal indistinctness in the region of concern. This extends over a an 11.9 cm excursion and primarily involves the anterior, medial, and lateral cortex of the tibial shaft. Hypodensity in the immediately adjacent soft tissues likely from edema, but without a large soft tissue mass component. In addition, there is circumferential periosteal reaction in the distal fibular metadiaphysis. The patient's original fracture in this vicinity was from 2015 and this degree of periosteal reaction and mildly moth-eaten cortex would not be a typical postoperative finding from 2015. This is probably a similar process to that involving the tibia and is thought to be acute. Bony demineralization underlying the lateral talar process as on image 62 series 7 and image 283 series 6. Ligaments Suboptimally assessed by CT. Muscles and Tendons Gas tracks along fascia planes adjacent to some of the muscle groups particularly in the knee region. Soft tissues There is substantial gas tracking along fascia planes in the right knee (and in the left knee but only seen in a very limited manner on the left). On prior CT abdomen the patient has subcutaneous emphysema in the chest, anterior and lateral abdominal wall, along the labia, and in the anterior thighs, and presumably the gas visible on today's exam represents that process tracking  down. IMPRESSION: 1. Permeative lesion in the mid to distal diaphysis of the right tibia with substantial periosteal reaction and endosteal indistinctness. Similar periosteal reaction and milder permeative moth-eaten lesion in the distal fibular metadiaphysis. Localized edema/fluid along the regions of maximum involvement but no obvious soft tissue mass. 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 2. Bony demineralization underlying the lateral talar process. This could also be an indicator of early osteomyelitis. 3. Gas tracking along fascia planes and subcutaneous tissues in the knee regions bilaterally, likely representing the inferior extent of the subcutaneous emphysema of the chest, abdomen, and thighs shown on CT abdomen imaging. Electronically Signed   By: Van Clines M.D.   On: 05/26/2022 13:01   DG CHEST PORT 1 VIEW  Result Date: 05/24/2022 CLINICAL DATA:  Tachypnea, history of bladder carcinoma EXAM: PORTABLE CHEST 1 VIEW COMPARISON:  05/23/2022 FINDINGS: Cardiac shadow is  stable. Right chest wall port is noted in satisfactory position. Aortic calcifications are seen. Lungs are well aerated bilaterally. No focal infiltrate or sizable effusion is noted. Persistent subcutaneous emphysema is noted related to prior laparoscopy IMPRESSION: No acute abnormality noted. Electronically Signed   By: Inez Catalina M.D.   On: 05/24/2022 17:13   DG Tibia/Fibula Right  Result Date: 05/24/2022 CLINICAL DATA:  Fall.  Right leg pain.  Right ankle pain. EXAM: RIGHT TIBIA AND FIBULA - 2 VIEW; RIGHT ANKLE - COMPLETE 3+ VIEW COMPARISON:  Right ankle radiographs 11/23/2013, 10/27/2013, and 10/10/2013; CT abdomen and pelvis 05/23/2022 FINDINGS: Right ankle: Interval healing of the previously seen oblique distal fibular metadiaphyseal fracture. There is increased periosteal thickening  of the anterior aspect of the distal fibular diaphysis, presumably from healing of that fracture. Minimal persistent fracture line lucency is seen in the region of the prior 2015 acute fracture, presumably now the patient's chronic baseline. The ankle mortise is symmetric and intact. Small plantar calcaneal heel spur. No acute fracture is seen. Moderate talonavicular joint space narrowing. Right tibia and fibula: No acute fracture is seen within the more proximal right tibia or fibula. There is mild cortical lucency/thinning of the anterior tibial diaphyseal cortex at mid height. Mild-to-moderate medial compartment of the knee joint space narrowing. There is moderate scattered subcutaneous air about the posteromedial greater than the lateral aspect of the partially visualized distal right thigh. Note is made of extensive subcutaneous anterior abdominopelvic wall emphysema seen extending along the anterior pelvis and bilateral proximal thighs on 05/23/2022 CT, presumably extending down the right thigh to the right knee on the current radiographs. IMPRESSION: Compared to 11/23/2013 1. Interval healing of previously seen distal fibular metadiaphyseal fracture. Minimal persistent fracture line lucency is seen in the region of the prior 2015 acute fracture, presumably now the patient's chronic baseline. 2. There is lucency with possible mild "hair on end" periosteal reaction within the anterolateral aspect of the right tibial diaphysis that is nonspecific. This may be secondary to chronic stress related changes or infectious/inflammatory process. This may be subacute to chronic posttraumatic change. It is difficult to exclude an infectious or malignant etiology. CT or MRI of the right tibia may be helpful for further evaluation. Electronically Signed   By: Yvonne Kendall M.D.   On: 05/24/2022 13:47   DG Ankle Complete Right  Result Date: 05/24/2022 CLINICAL DATA:  Fall.  Right leg pain.  Right ankle pain. EXAM: RIGHT  TIBIA AND FIBULA - 2 VIEW; RIGHT ANKLE - COMPLETE 3+ VIEW COMPARISON:  Right ankle radiographs 11/23/2013, 10/27/2013, and 10/10/2013; CT abdomen and pelvis 05/23/2022 FINDINGS: Right ankle: Interval healing of the previously seen oblique distal fibular metadiaphyseal fracture. There is increased periosteal thickening of the anterior aspect of the distal fibular diaphysis, presumably from healing of that fracture. Minimal persistent fracture line lucency is seen in the region of the prior 2015 acute fracture, presumably now the patient's chronic baseline. The ankle mortise is symmetric and intact. Small plantar calcaneal heel spur. No acute fracture is seen. Moderate talonavicular joint space narrowing. Right tibia and fibula: No acute fracture is seen within the more proximal right tibia or fibula. There is mild cortical lucency/thinning of the anterior tibial diaphyseal cortex at mid height. Mild-to-moderate medial compartment of the knee joint space narrowing. There is moderate scattered subcutaneous air about the posteromedial greater than the lateral aspect of the partially visualized distal right thigh. Note is made of extensive subcutaneous anterior abdominopelvic wall emphysema seen extending  along the anterior pelvis and bilateral proximal thighs on 05/23/2022 CT, presumably extending down the right thigh to the right knee on the current radiographs. IMPRESSION: Compared to 11/23/2013 1. Interval healing of previously seen distal fibular metadiaphyseal fracture. Minimal persistent fracture line lucency is seen in the region of the prior 2015 acute fracture, presumably now the patient's chronic baseline. 2. There is lucency with possible mild "hair on end" periosteal reaction within the anterolateral aspect of the right tibial diaphysis that is nonspecific. This may be secondary to chronic stress related changes or infectious/inflammatory process. This may be subacute to chronic posttraumatic change. It is  difficult to exclude an infectious or malignant etiology. CT or MRI of the right tibia may be helpful for further evaluation. Electronically Signed   By: Yvonne Kendall M.D.   On: 05/24/2022 13:47   CT ABDOMEN PELVIS W CONTRAST  Result Date: 05/23/2022 CLINICAL DATA:  Postoperative abdominal pain. Radical cystectomy and ileal conduit performed on 05/09/2022. Urothelial carcinoma of the bladder. EXAM: CT ABDOMEN AND PELVIS WITH CONTRAST TECHNIQUE: Multidetector CT imaging of the abdomen and pelvis was performed using the standard protocol following bolus administration of intravenous contrast. RADIATION DOSE REDUCTION: This exam was performed according to the departmental dose-optimization program which includes automated exposure control, adjustment of the mA and/or kV according to patient size and/or use of iterative reconstruction technique. CONTRAST:  66m OMNIPAQUE IOHEXOL 300 MG/ML  SOLN COMPARISON:  CT abdomen and pelvis 04/29/2022. CT chest abdomen and pelvis 03/01/2022. FINDINGS: Lower chest: Chest wall emphysema present compatible with recent surgery. There is a new 5 mm left lower lobe nodule. Hepatobiliary: No focal liver abnormality is seen. No gallstones, gallbladder wall thickening, or biliary dilatation. Pancreas: Unremarkable. No pancreatic ductal dilatation or surrounding inflammatory changes. Spleen: Normal in size without focal abnormality. Adrenals/Urinary Tract: Patient is status post cystectomy with new right-sided abdominal ileal conduit/urinary diversion. There is mild bilateral hydronephrosis without obstructing calculus. Bilateral ureteral stents are present. There is no perinephric fluid collection or stranding. There is normal renal enhancement bilaterally. The adrenal glands are within normal limits. Stomach/Bowel: Appendix is not seen. No evidence of bowel wall thickening, distention, or inflammatory changes. There is a moderate-sized hiatal hernia. Stomach is nondilated. There is  sigmoid colon diverticulosis. Vascular/Lymphatic: Aortic atherosclerosis. No enlarged abdominal or pelvic lymph nodes. Reproductive: Status post hysterectomy. No adnexal masses. Other: There is extensive subcutaneous abdominal and pelvic wall emphysema compatible with recent surgery. There is no abdominal wall hernia. No focal fluid collections are identified. Musculoskeletal: Degenerative changes affect the spine. IMPRESSION: 1. Patient is status post cystectomy with right-sided ileal conduit/urinary diversion. There is mild bilateral hydronephrosis without obstructing calculus. Bilateral ureteral stents are in place. 2. Extensive subcutaneous emphysema compatible with recent surgery. 3. Moderate-sized hiatal hernia. 4. Aortic atherosclerosis. 5. There is a new 5 mm left lower lobe nodule. Metastatic disease not excluded. Aortic Atherosclerosis (ICD10-I70.0). Electronically Signed   By: ARonney AstersM.D.   On: 05/23/2022 21:20   DG Chest 2 View  Result Date: 05/23/2022 CLINICAL DATA:  Suspected sepsis. Weakness. Patient had robotic assisted laparoscopic cystectomy with ileal conduit and pelvic lymph node dissection on 05/09/2022. EXAM: CHEST - 2 VIEW COMPARISON:  03/01/2022 FINDINGS: Patient has RIGHT-sided PowerPort, tip overlying the superior vena cava. Heart size is normal. The lungs are free of focal consolidations and pleural effusions. There is no pulmonary edema. There is subcutaneous emphysema in the soft tissues of the UPPER abdomen, likely related to recent laparoscopic procedure. IMPRESSION: 1.  No evidence for acute cardiopulmonary abnormality. 2. Subcutaneous emphysema in the UPPER abdomen, likely related to recent laparoscopic procedure. Consider CT of the abdomen and pelvis as needed. Electronically Signed   By: Nolon Nations M.D.   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  #3 admitted with altered mental status weakness and change in performance status related to sepsis from urinary tract infection, hypercalcemia, likely delirium, anemia Patient's mental status appears to be clearing up now and she could have a conversation with me today when seen.  #4 MRSA and Streptococcus mitis sepsis from urinary source on vancomycin.  #5  Status post hypercalcemia status post Zometa and calcitonin with resolution-likely from dehydration plus bone mets. Now with hypocalcemia and hypokalemia PLAN -I discussed her current hospital course labs imaging studies with the patient and her niece in details in person and her daughter Gae Dry on the the phone. -We discussed her pathology results from her biopsy of the bone lesion that confirms metastatic high-grade urothelial carcinoma.  -Imaging studies also suggest possible concerning lesion in the lung.  I suspect she has more widespread disease which might become apparent on the PET scan. -We discussed the patient's current concerning performance status with significant urinary tract infections,  altered mental status with risk of recurrent delirium and electrolyte issues. -I discussed goals of care in details with the patient at bedside and then with her daughter [name] on the phone. -We discussed that any treatment moving ahead would not be curative and only would be palliative. -Also any treatments would involve significant burden of care with regards to coming to the cancer center, additional imaging, issues  with lab abnormalities, transfusion needs, electrolyte abnormalities, infection issues. -At this current moment the patient's performance status is too poor to pursue palliative treatments.  If her performance status were at an ECOG PS of less than or equal to 2 there might have been additional benefits to considering systemic palliative treatments including taking a softer approach such as immunotherapy alone in combination with other targeted treatments. -We discussed that it would be quite reasonable for the patient to take an approach of best supportive cares through hospice and she used to focus on comfort and spending quality time with her family. -The patient and the daughter after hearing all the oncologic considerations are inclined to pursue best of part of care through hospice. -Palliative care team is already following on the hospitalist for her discussion. -Appreciate palliative care help in and out logistics of her transition to best supportive cares at a time in place of the patient's and her daughters choosing.  .The total time spent in the appointment was 80 minutes* .  All of the patient's questions were answered with apparent satisfaction. The patient knows to call the clinic with any problems, questions or concerns.   Sullivan Lone MD MS AAHIVMS [facility] Hematology/Oncology Physician [facility]  .*Total Encounter Time as defined by the Centers for Medicare and Medicaid Services includes, in addition to the face-to-face time of a  patient visit (documented in the note above) non-face-to-face time: obtaining and reviewing outside history, ordering and reviewing medications, tests or procedures, care coordination (communications with other health care professionals or caregivers) and documentation in the medical record.  05/31/2022 3:20 PM

## 2022-06-01 DIAGNOSIS — N390 Urinary tract infection, site not specified: Secondary | ICD-10-CM | POA: Diagnosis not present

## 2022-06-01 DIAGNOSIS — A419 Sepsis, unspecified organism: Secondary | ICD-10-CM | POA: Diagnosis not present

## 2022-06-01 LAB — BASIC METABOLIC PANEL
Anion gap: 4 — ABNORMAL LOW (ref 5–15)
BUN: 13 mg/dL (ref 8–23)
CO2: 19 mmol/L — ABNORMAL LOW (ref 22–32)
Calcium: 6.7 mg/dL — ABNORMAL LOW (ref 8.9–10.3)
Chloride: 109 mmol/L (ref 98–111)
Creatinine, Ser: 0.53 mg/dL (ref 0.44–1.00)
GFR, Estimated: >60 mL/min (ref >60)
Glucose, Bld: 130 mg/dL — ABNORMAL HIGH (ref 70–99)
Potassium: 3 mmol/L — ABNORMAL LOW (ref 3.5–5.1)
Sodium: 132 mmol/L — ABNORMAL LOW (ref 135–145)

## 2022-06-01 LAB — CBC
HCT: 28.9 % — ABNORMAL LOW (ref 36.0–46.0)
Hemoglobin: 9 g/dL — ABNORMAL LOW (ref 12.0–15.0)
MCH: 26.2 pg (ref 26.0–34.0)
MCHC: 31.1 g/dL (ref 30.0–36.0)
MCV: 84 fL (ref 80.0–100.0)
Platelets: 231 10*3/uL (ref 150–400)
RBC: 3.44 MIL/uL — ABNORMAL LOW (ref 3.87–5.11)
RDW: 15.8 % — ABNORMAL HIGH (ref 11.5–15.5)
WBC: 11.8 10*3/uL — ABNORMAL HIGH (ref 4.0–10.5)
nRBC: 0 % (ref 0.0–0.2)

## 2022-06-01 LAB — GLUCOSE, CAPILLARY
Glucose-Capillary: 137 mg/dL — ABNORMAL HIGH (ref 70–99)
Glucose-Capillary: 128 mg/dL — ABNORMAL HIGH (ref 70–99)

## 2022-06-01 MED ORDER — POTASSIUM CHLORIDE CRYS ER 20 MEQ PO TBCR
40.0000 meq | EXTENDED_RELEASE_TABLET | ORAL | Status: AC
  Administered 2022-06-01: 40 meq via ORAL
  Filled 2022-06-01: qty 2

## 2022-06-01 NOTE — Progress Notes (Addendum)
PROGRESS NOTE  Brandy Wheeler  [id number] DOB: 1931-09-30 DOA: 05/23/2022 PCP: Pcp, No   Brief Narrative: Patient is 89 year old female with history of bladder cancer status post robotic cystectomy, ileal conduit diversion, GERD, anemia who presented for the evaluation of altered mental status.  She was found to have sepsis secondary to UTI.  Started on antibiotics.  Hospital course remarkable for persistent altered mental status, severe hyperglycemia, right lower extremity pain with suspicion of malignancy versus osteomyelitis.   Underwent CT-guided bone biopsy of right anterior bone lesion by IR on 2/6.  Palliative care also following for goals of care.  Possible plans for hospice approach after biopsy confirmed malignancy.  Ongoing goals of care  Assessment & Plan:  Principal Problem:   Sepsis due to urinary tract infection ([place]) Active Problems:   Bladder cancer (HCC)   Hypercalcemia   Hyperkalemia   Normocytic anemia   Acute metabolic encephalopathy   Protein-calorie malnutrition, severe   Hypokalemia   Acute cystitis with hematuria   Acute metabolic encephalopathy: Unclear etiology.  Initially thought to be from hypercalcemia  which has resolved.  CT head did not show any acute intracranial abnormalities.  MRI did not show any acute findings, showed chronic microvascular changes, chronic infarcts.  Minimize narcotics, sedatives. Mental status has  improved and she is mostly oriented now.  Severe sepsis/UTI: Present on admission.  Procalcitonin negative.  Urine culture showed MRSA and Streptococcus mitis/oralis.  Blood cultures have not shown any growth.  ID following.  Continue vancomycin,day 7/10.  We can change to doxycycline if she is discharged early.  Plan for 10 days of treatment. History of recent urological procedure.  Has mild leukocytosis.  Right lower extremity lesion/right ankle/leg pain: CT tibia/fibula suggested possible osteomyelitis.  Case was discussed with on-call  for orthopedics, Dr. Grandville Silos who recommended MRI versus CT-guided biopsy.  MRI tibia/fibula showed widespread osseous abnormalities throughout both lower legs with suspicion of metastatic disease / multiple myeloma.  TTE did not show any vegetation.  Underwent IR guided bone biopsy/culture.  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  Vitamin D level normal.  PT is low.  Given Zometa, calcitonin.  Calcium level currently stable.    Normocytic anemia: Hemoglobin dropped to the range of 7.  Low iron as per iron studies. S/p a unit of blood transfusion.  FOBT not collected yet.  No evidence of acute blood loss.  Given IV iron.  Hemoglobin in the range of 9  Left tibia/fibula fracture: Follows with orthopedic surgery, has casted left leg, non weightbearing on left side.  PT/OT recommending SNF on discharge.  Oligometastatic bladder cancer: Follows with oncology and urology.  Recent history of robotic cystectomy with node dissection and conduit diversion  Severe protein calorie malnutrition: Nutrition  following.  Continues to have poor oral intake.  GERD: Continue Protonix  Hypokalemia: Potassium supplemented.  Goals of care: Elderly patient with multiple comorbidities, history of bladder cancer now with possible another malignancy.  Overall prognosis is poor.  Goals of care discussed with patient and  daughter. daughter said her mom is not in a good state of mind and she makes a decision for her and she wants to put her as full code.  Palliative care following.  Now the biopsy from the bone confirming metastatic lesion from urothelial origin, oncology saw the patient and recommended hospice approach given concern for widespread metastasis.  Family leaning to comfort  care/hospice approach.  Palliative care will follow    Nutrition Problem: Severe Malnutrition Etiology: chronic illness, cancer  and cancer related treatments    DVT prophylaxis:heparin injection 5,000 Units Start: 05/29/22 2200     Code Status: Full Code  Family Communication: Called and discussed with daughter on phone on 2/7  Patient status: Inpatient  Patient is from : Home  Anticipated discharge to:SNF vs residential hospice  Estimated DC date:not sure, need to wait for Palliaitve care conversation with family before discharge plan   Consultants: ID,oncology,palliative care  Procedures: Bone biopsy  Antimicrobials:  Anti-infectives (From admission, onward)    Start     Dose/Rate Route Frequency Ordered Stop   05/27/22 1000  vancomycin (VANCOREADY) IVPB 750 mg/150 mL        750 mg 150 mL/hr over 60 Minutes Intravenous Every 24 hours 05/26/22 1147     05/26/22 1115  vancomycin (VANCOCIN) IVPB 1000 mg/200 mL premix        1,000 mg 200 mL/hr over 60 Minutes Intravenous  Once 05/26/22 1022 05/26/22 1241   05/23/22 2000  ceFEPIme (MAXIPIME) 2 g in sodium chloride 0.9 % 100 mL IVPB  Status:  Discontinued        2 g 200 mL/hr over 30 Minutes Intravenous Every 12 hours 05/23/22 1935 05/26/22 1002   05/23/22 1930  ceFEPIme (MAXIPIME) 2 g in sodium chloride 0.9 % 100 mL IVPB  Status:  Discontinued        2 g 200 mL/hr over 30 Minutes Intravenous  Once 05/23/22 1923 05/23/22 1935   05/23/22 1615  cefTRIAXone (ROCEPHIN) 2 g in sodium chloride 0.9 % 100 mL IVPB        2 g 200 mL/hr over 30 Minutes Intravenous  Once 05/23/22 1614 05/23/22 1740       Subjective: Patient seen and examined at bedside today.  Not in any Distress.  Lying in bed.  Alert, awake and mostly oriented.  Denies any abdomen pain, nausea or vomiting.  On room air.  Continues to have poor oral intake, breakfast on the tray which she is untouched  Objective: Vitals:   05/31/22 1259 05/31/22 2053 06/01/22 0500 06/01/22 0544  BP: 137/63 (!) 152/62  134/64  Pulse: (!) 103 (!) 107  (!) 105  Resp: 18 18  18  $ Temp: 98.1 F (36.7 C)  99.4 F (37.4 C)  98.6 F (37 C)  TempSrc: Oral Oral  Oral  SpO2: 100% 94%  90%  Weight:   57.2 kg   Height:        Intake/Output Summary (Last 24 hours) at 06/01/2022 1132 Last data filed at 06/01/2022 1006 Gross per 24 hour  Intake 961.97 ml  Output 1500 ml  Net -538.03 ml   Filed Weights   05/30/22 0500 05/31/22 0500 06/01/22 0500  Weight: 55.5 kg 58.1 kg 57.2 kg    Examination:    General exam: Overall comfortable, not in distress, weak, chronically ill looking HEENT: PERRL Respiratory system:  no wheezes or crackles  Cardiovascular system: S1 & S2 heard, RRR.  Gastrointestinal system: Abdomen is nondistended, soft and nontender. Central nervous system: Alert and oriented Extremities: Trace lower extremity edema, no clubbing ,no cyanosis, cast on left leg Skin: No rashes, no ulcers,no icterus     Data Reviewed: I have personally reviewed following labs and imaging studies  CBC: Recent Labs  Lab 05/28/22 0555 05/29/22 0540 05/30/22 0517 05/30/22 2215 05/31/22 0602 06/01/22 0942  WBC 11.3* 11.5* 11.0*  --  10.7* 11.8*  HGB 7.9* 7.4* 7.1* 8.4* 8.4* 9.0*  HCT 25.6* 24.6* 23.5* 26.6* 26.6* 28.9*  MCV 89.2 89.1 88.3  --  84.4 84.0  PLT 265 243 225  --  215 [id number]   Basic Metabolic Panel: Recent Labs  Lab 05/27/22 0449 05/28/22 0555 05/29/22 0540 05/30/22 0517 06/01/22 0942  NA 142 139 139 136 132*  K 3.8 3.3* 3.8 3.5 3.0*  CL 116* 113* 116* 111 109  CO2 21* 19* 19* 18* 19*  GLUCOSE 164* 157* 148* 145* 130*  BUN 27* 23 22 22 13  $ CREATININE 0.68 0.68 0.68 0.59 0.53  CALCIUM 8.9 8.3* 7.9* 7.5* 6.7*     Recent Results (from the past 240 hour(s))  Urine Culture (for pregnant, neutropenic or urologic patients or patients with an indwelling urinary catheter)     Status: Abnormal   Collection Time: 05/23/22  3:50 PM   Specimen: Urine, Clean Catch  Result Value Ref Range Status   Specimen Description   Final    URINE, CLEAN CATCH Performed at [facility], [place] [address] [place], [place] [address]    Special Requests   Final    NONE Performed at [facility], [place] [address], [place], [place] [address]    Culture (A)  Final    >=100,000 COLONIES/mL METHICILLIN RESISTANT STAPHYLOCOCCUS AUREUS 60,000 COLONIES/mL STREPTOCOCCUS MITIS/ORALIS    Report Status 05/26/2022 FINAL  Final   Organism ID, Bacteria METHICILLIN RESISTANT STAPHYLOCOCCUS AUREUS (A)  Final      Susceptibility   Methicillin resistant staphylococcus aureus - MIC*    CIPROFLOXACIN >=8 RESISTANT Resistant     GENTAMICIN <=0.5 SENSITIVE Sensitive     NITROFURANTOIN <=16 SENSITIVE Sensitive     OXACILLIN >=4 RESISTANT Resistant     TETRACYCLINE <=1 SENSITIVE Sensitive     VANCOMYCIN 1 SENSITIVE Sensitive     TRIMETH/SULFA <=10 SENSITIVE Sensitive     CLINDAMYCIN <=0.25 SENSITIVE Sensitive     RIFAMPIN <=0.5 SENSITIVE Sensitive     Inducible Clindamycin NEGATIVE Sensitive     * >=100,000 COLONIES/mL METHICILLIN RESISTANT STAPHYLOCOCCUS AUREUS  Culture, blood (Routine X 2) w Reflex to ID Panel     Status: None   Collection Time: 05/23/22  4:00 PM   Specimen: BLOOD  Result Value Ref Range Status   Specimen Description   Final    BLOOD PORTA CATH Performed at [place] [address], [place], [place] [address]    Special Requests   Final    BOTTLES DRAWN AEROBIC AND ANAEROBIC Blood Culture results may not be optimal due to an inadequate volume of blood received in culture bottles Performed at [place] [facility] [address], [place], [place] [address]    Culture   Final    NO GROWTH 5 DAYS Performed at [facility], [place] [address] [place],  [address]    Report Status 05/28/2022 FINAL  Final  Resp panel by RT-PCR (RSV, Flu A&B, Covid) Anterior Nasal Swab     Status: None   Collection Time: 05/23/22  4:02 PM   Specimen: Anterior Nasal Swab  Result Value Ref Range Status   SARS  Coronavirus 2 by RT PCR NEGATIVE NEGATIVE Final    Comment: (NOTE) SARS-CoV-2 target nucleic acids are NOT DETECTED.  The SARS-CoV-2 RNA is generally detectable in upper respiratory specimens during the acute phase of infection. The lowest concentration of SARS-CoV-2 viral copies this assay can detect is 138 copies/mL.  A negative result does not preclude SARS-Cov-2 infection and should not be used as the sole basis for treatment or other patient management decisions. A negative result may occur with  improper specimen collection/handling, submission of specimen other than nasopharyngeal swab, presence of viral mutation(s) within the areas targeted by this assay, and inadequate number of viral copies(<138 copies/mL). A negative result must be combined with clinical observations, patient history, and epidemiological information. The expected result is Negative.  Fact Sheet for Patients:  EntrepreneurPulse.com.au  Fact Sheet for Healthcare Providers:  IncredibleEmployment.be  This test is no t yet approved or cleared by the [place] FDA and  has been authorized for detection and/or diagnosis of SARS-CoV-2 by FDA under an Emergency Use Authorization (EUA). This EUA will remain  in effect (meaning this test can be used) for the duration of the COVID-19 declaration under Section 564(b)(1) of the Act, 21 U.S.C.section 360bbb-3(b)(1), unless the authorization is terminated  or revoked sooner.       Influenza A by PCR NEGATIVE NEGATIVE Final   Influenza B by PCR NEGATIVE NEGATIVE Final    Comment: (NOTE) The Xpert Xpress SARS-CoV-2/FLU/RSV plus assay is intended as an aid in the diagnosis of influenza from Nasopharyngeal swab specimens and should not be used as a sole basis for treatment. Nasal washings and aspirates are unacceptable for Xpert Xpress SARS-CoV-2/FLU/RSV testing.  Fact Sheet for  Patients: EntrepreneurPulse.com.au  Fact Sheet for Healthcare Providers: IncredibleEmployment.be  This test is not yet approved or cleared by the [place] FDA and has been authorized for detection and/or diagnosis of SARS-CoV-2 by FDA under an Emergency Use Authorization (EUA). This EUA will remain in effect (meaning this test can be used) for the duration of the COVID-19 declaration under Section 564(b)(1) of the Act, 21 U.S.C. section 360bbb-3(b)(1), unless the authorization is terminated or revoked.     Resp Syncytial Virus by PCR NEGATIVE NEGATIVE Final    Comment: (NOTE) Fact Sheet for Patients: EntrepreneurPulse.com.au  Fact Sheet for Healthcare Providers: IncredibleEmployment.be  This test is not yet approved or cleared by the [place] FDA and has been authorized for detection and/or diagnosis of SARS-CoV-2 by FDA under an Emergency Use Authorization (EUA). This EUA will remain in effect (meaning this test can be used) for the duration of the COVID-19 declaration under Section 564(b)(1) of the Act, 21 U.S.C. section 360bbb-3(b)(1), unless the authorization is terminated or revoked.  Performed at [facility], [place] [address] [place], [place] [address]   Culture, blood (Routine X 2) w Reflex to ID Panel     Status: None   Collection Time: 05/23/22  9:47 PM   Specimen: BLOOD  Result Value Ref Range Status   Specimen Description   Final    BLOOD BLOOD RIGHT HAND Performed at [place] [address], [place], [place] [address]    Special Requests   Final    BOTTLES DRAWN AEROBIC ONLY Blood Culture results may not be optimal due to an inadequate volume of blood received in culture bottles Performed at [place] [address], [place], [place] [address]    Culture   Final    NO GROWTH 5 DAYS Performed at [place], [place] [address] [place],  [address]    Report Status 05/28/2022 FINAL  Final  Respiratory (~20 pathogens) panel by PCR     Status: None   Collection Time: 05/24/22  6:39 PM   Specimen: Nasopharyngeal Swab; Respiratory  Result Value Ref Range Status   Adenovirus NOT DETECTED NOT DETECTED Final   Coronavirus 229E NOT DETECTED NOT DETECTED Final    Comment: (NOTE) The Coronavirus on the Respiratory Panel, DOES NOT test for the novel  Coronavirus (2019 nCoV)    Coronavirus HKU1 NOT DETECTED NOT DETECTED Final   Coronavirus NL63 NOT DETECTED NOT DETECTED Final   Coronavirus OC43 NOT DETECTED NOT DETECTED Final   Metapneumovirus NOT DETECTED NOT DETECTED Final   Rhinovirus / Enterovirus NOT DETECTED NOT DETECTED Final   Influenza A NOT DETECTED NOT DETECTED Final   Influenza B NOT DETECTED NOT DETECTED Final   Parainfluenza Virus 1 NOT DETECTED NOT DETECTED Final   Parainfluenza Virus 2 NOT DETECTED NOT DETECTED Final   Parainfluenza Virus 3 NOT DETECTED NOT DETECTED Final   Parainfluenza Virus 4 NOT DETECTED NOT DETECTED Final   Respiratory Syncytial Virus NOT DETECTED NOT DETECTED Final   Bordetella pertussis NOT DETECTED NOT DETECTED Final   Bordetella Parapertussis NOT DETECTED NOT DETECTED Final   Chlamydophila pneumoniae NOT DETECTED NOT DETECTED Final   Mycoplasma pneumoniae NOT DETECTED NOT DETECTED Final    Comment: Performed at [facility], [place] [address], [place], [place] [address]  Aerobic/Anaerobic Culture w Gram Stain (surgical/deep wound)     Status: None (Preliminary result)   Collection Time: 05/29/22  3:10 PM   Specimen: Bone; Tissue  Result Value Ref Range Status   Specimen Description   Final    BONE BIOPSY TIBIA RIGHT Performed at [facility], [place] [address] [place], [place] [address]    Special Requests   Final    NONE Performed at [facility], [place] [address], [place], [place] [address]    Gram Stain NO WBC SEEN NO  ORGANISMS SEEN   Final   Culture   Final    NO GROWTH 2 DAYS NO ANAEROBES ISOLATED; CULTURE IN PROGRESS FOR 5 DAYS SEND RESULTS TO DR Quentin Mulling AND DR NETTEY Performed at [facility], [address] [place], [place] [address]    Report Status PENDING  Incomplete     Radiology Studies: No results found.  Scheduled Meds:  (feeding supplement) PROSource Plus  30 mL Oral TID BM   Chlorhexidine Gluconate Cloth  6 each Topical Daily   [START ON 06/03/2022] ferrous sulfate  325 mg Oral Q breakfast   heparin  5,000 Units Subcutaneous Q8H   metoprolol tartrate  12.5 mg Oral BID   multivitamin  15 mL Oral Daily   pantoprazole  40 mg Oral Daily   sodium chloride flush  3 mL Intravenous Q12H   Continuous Infusions:  ferric gluconate (FERRLECIT) IVPB 135 mL/hr at 06/01/22 0502   vancomycin 750 mg (06/01/22 1043)     LOS: 9 days   Shelly Coss, MD [facility] P2/12/2022, 11:32 AM

## 2022-06-01 NOTE — TOC Progression Note (Addendum)
Transition of Care [facility]) - Progression Note    Patient Details  Name: Brandy Wheeler MRN: [id number] Date of Birth: [birth year]  Transition of Care [facility]) CM/SW Contact  Henrietta Dine, RN Phone Number: 06/01/2022, 1:06 PM  Clinical Narrative:    TOC contacted for residential hospice request; see note from [facility]; contacted pt's dtr Gae Dry [phone]); she says her choice is [facility]; called facility [phone]); spoke w/ Vickii Chafe; Vickii Chafe says she will have Hurshel Keys, Charge Nurse call back; awaiting return call.  -1312- Peggy from facility called back and transferred this CM to referral dept; Manus Gunning and Lesleigh Noe are not available; she left message for both; she also gave referral dept # [phone]; will call back to give referral.  -1356- no return call received from [place]; called back and spoke w/ Cherise; referral given and she will give info to [place]; she also says currently there are no beds available; also gave her # for pt's dtr; TOC will follow.  Expected Discharge Plan: [place] Barriers to Discharge: Continued Medical Work up  Expected Discharge Plan and Services   Discharge Planning Services: CM Consult   Living arrangements for the past 2 months: [place]                                       Social Determinants of Health (SDOH) Interventions SDOH Screenings   Food Insecurity: No Food Insecurity (05/28/2022)  Housing: Low Risk  (05/28/2022)  Transportation Needs: No Transportation Needs (05/28/2022)  Utilities: Not At Risk (05/28/2022)  Tobacco Use: Low Risk  (05/28/2022)    Readmission Risk Interventions     No data to display

## 2022-06-01 NOTE — Progress Notes (Signed)
SLP Cancellation Note  Patient Details Name: Brandy Wheeler MRN: [id number] DOB: 1931-05-13   Cancelled treatment:       Reason Eval/Treat Not Completed: Other (comment) (pt now comfort care:) RN had reported poor intake.   Kathleen Lime, MS [facility] SLP Acute Rehab Services Office [phone]  Macario Golds 06/01/2022, 3:44 PM

## 2022-06-01 NOTE — Progress Notes (Signed)
OT Cancellation Note  Patient Details Name: Brandy Wheeler MRN: [id number] DOB: 08-25-1931   Cancelled Treatment:    Reason Eval/Treat Not Completed: Other (comment). Patient has been made comfort care and will be discharging home with hospice. OT will sign off. Please reorder if GOC change.  Brandy Wheeler 06/01/2022, 4:50 PM

## 2022-06-01 NOTE — Progress Notes (Signed)
Daily Progress Note   Patient Name: Brandy Wheeler       Date: 06/01/2022 DOB: 12-29-1931  Age: 89 y.o. MRN#: [id number] Attending Physician: Shelly Coss, MD Primary Care Physician: Pcp, No Admit Date: 05/23/2022  Reason for Consultation/Follow-up: Establishing goals of care  Subjective:  Appears weak, resting in bed, defers to daughter to make decisions. She met with oncology today.   Length of Stay: 9  Current Medications: Scheduled Meds:   (feeding supplement) PROSource Plus  30 mL Oral TID BM   Chlorhexidine Gluconate Cloth  6 each Topical Daily   [START ON 06/03/2022] ferrous sulfate  325 mg Oral Q breakfast   heparin  5,000 Units Subcutaneous Q8H   metoprolol tartrate  12.5 mg Oral BID   multivitamin  15 mL Oral Daily   pantoprazole  40 mg Oral Daily   potassium chloride  40 mEq Oral Q2H   sodium chloride flush  3 mL Intravenous Q12H    Continuous Infusions:  ferric gluconate (FERRLECIT) IVPB 135 mL/hr at 06/01/22 0502   vancomycin 750 mg (06/01/22 1043)    PRN Meds: acetaminophen **OR** acetaminophen, alum & mag hydroxide-simeth, lip balm, ondansetron **OR** ondansetron (ZOFRAN) IV, senna-docusate  Physical Exam         Weak appearing elderly lady Generalized weakness, appears chronically ill Regular work of breathing Shallow clear breath sounds S1-S2 Abdomen not distended  Vital Signs: BP 134/64 (BP Location: Left Arm)   Pulse (!) 105   Temp 98.6 F (37 C) (Oral)   Resp 18   Ht 5' 3"$  (1.6 m)   Wt 57.2 kg Comment: blankets removed  SpO2 90%   BMI 22.34 kg/m  SpO2: SpO2: 90 % O2 Device: O2 Device: Room Air O2 Flow Rate: O2 Flow Rate (L/min): 2 L/min  Intake/output summary:  Intake/Output Summary (Last 24 hours) at 06/01/2022 1232 Last data filed at  06/01/2022 1006 Gross per 24 hour  Intake 961.97 ml  Output 1500 ml  Net -538.03 ml   LBM: Last BM Date : 05/31/22 Baseline Weight: Weight: 63 kg Most recent weight: Weight: 57.2 kg (blankets removed)       Palliative Assessment/Data:      Patient Active Problem List   Diagnosis Date Noted   Hypokalemia 05/26/2022   Acute cystitis with hematuria 05/26/2022   Protein-calorie malnutrition,  severe 05/24/2022   Sepsis due to urinary tract infection ([place]) 05/23/2022   Hypercalcemia 05/23/2022   Hyperkalemia 05/23/2022   Normocytic anemia [id number]   Acute metabolic encephalopathy [id number]   Malnutrition of moderate degree 05/11/2022   Surgery, elective 05/09/2022   Bladder cancer ([place]) 05/09/2022   Health education 04/30/2022   Encounter for ostomy care education 04/11/2022   Attention to urostomy [facility]) 04/11/2022   Port-A-Cath in place 11/16/2021   Malignant neoplasm of urinary bladder ([place]) 11/03/2021   Bladder tumor 10/09/2021   Fracture of distal fibula 10/19/2013    Palliative Care Assessment & Plan   Patient Profile:    Assessment:  89 year old female with  history of 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 She completed her planned chemotherapy in October 2023 and had her surgery on 05/09/2022. 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 She has been admitted with altered mental status weakness and change in performance status related to sepsis from urinary tract infection, hypercalcemia, likely delirium, anemia Hospital course also complicated by MRSA and Streptococcus mitis sepsis from urinary source on vancomycin. She is also status post  hypercalcemia status post Zometa and calcitonin with resolution-likely from dehydration plus bone mets.   Palliative care following for CODE STATUS and goals of care discussions.  Recommendations/Plan: Chart reviewed, discussed in an interdisciplinary manner with medical oncology and hospital medicine along with [facility] colleagues.  Call placed and discussed with daughter in detail as well.  Daughter is thankful for the information she has received from medical oncology.  She prefers comfort measures and hospice approach.  We discussed about CODE STATUS versus between full code versus DNR/DNI discussed.  CODE STATUS is elected as DNR/DNI.  Patient had stated preference for DNR/DNI couple days ago as well. We discussed about differences between home with hospice versus residential hospice.  Patient has escalating symptom burden, declining oral intake, declining functional status.  As such, would request evaluation for consideration for transfer to residential hospice towards the end of this hospitalization.  Will request TOC assistance. Plan: DNR/DNI Comfort measures Consider residential hospice.   Goals of Care and Additional Recommendations: Limitations on Scope of Treatment: Full Comfort Care  Code Status:    Code Status Orders  (From admission, onward)           Start     Ordered   06/01/22 1231  Do not attempt resuscitation (DNR)  Continuous       Question Answer Comment  If patient has no pulse and is not breathing Do Not Attempt Resuscitation   If patient has a pulse and/or is breathing: Medical Treatment Goals COMFORT MEASURES: Keep clean/warm/dry, use medication by any route; positioning, wound care and other measures to relieve pain/suffering; use oxygen, suction/manual treatment of airway obstruction for comfort; do not transfer unless for comfort needs.   Consent: Discussion documented in EHR or advanced directives reviewed      06/01/22 1231           Code Status  History     Date Active Date Inactive Code Status Order ID Comments User Context   05/23/2022 1923 06/01/2022 1231 Full Code [id number]  Lenore Cordia, MD ED   05/09/2022 2049 05/17/2022 2139 Full Code [id number]  Debbrah Alar, PA-C Inpatient  10/09/2021 1617 10/10/2021 1622 Full Code [id number]  Lucas Mallow, MD Inpatient       Prognosis:  < 2 weeks  Discharge Planning: Hospice facility  Care plan was discussed with  IDT Daughter on phone.   Thank you for allowing the Palliative Medicine Team to assist in the care of this patient.  High MDM     Greater than 50%  of this time was spent counseling and coordinating care related to the above assessment and plan.  Loistine Chance, MD  Please contact Palliative Medicine Team phone at [phone] for questions and concerns.

## 2022-06-01 NOTE — Care Management Important Message (Signed)
Important Message  Patient Details IM Letter given. Name: Brandy Wheeler MRN: [id number] Date of Birth: 12-05-1931   Medicare Important Message Given:  Yes     Kerin Salen 06/01/2022, 9:26 AM

## 2022-06-01 NOTE — Progress Notes (Signed)
Pharmacy Antibiotic Note  Brandy Wheeler is a 89 y.o. female admitted on 05/23/2022 with  MRSA UTI .  Pharmacy has been consulted for Vancomycin dosing.  ID:  - MRSA UTI - TTE negative for vegetation  - Afebrile  Antimicrobials this admission: 1/31 CTX x 1 1/31 cefepime>> 2/3 2/3 Vancomycin >>   Dose adjustments: 2/8: Trough 8, Peak 37 = AUC 468 (Goal 400-550)  Microbiology results: 1/31 UCx:  > 100k MRSA, 60k strep mitis/oralis F 1/31 BCx: NGF 2/1 resp panel neg 2/1 procalcitonin neg 2/6 bone biopsy: no growth to date  Plan: -Continue vancomycin 750 mg IV q24h (day 7/10) -Per ID, can switch to oral doxycycline 100 mg BID at discharge to complete a total of 10 days of antibiotics   Height: 5' 3"$  (0000000 cm) Weight: 57.2 kg (126 lb 1.7 oz) (blankets removed) IBW/kg (Calculated) : 52.4  Temp (24hrs), Avg:98.7 F (37.1 C), Min:98.1 F (36.7 C), Max:99.4 F (37.4 C)  Recent Labs  Lab 05/27/22 0449 05/27/22 0805 05/28/22 0555 05/29/22 0540 05/30/22 0517 05/31/22 0602 05/31/22 1025 05/31/22 1158 06/01/22 0942  WBC  --    < > 11.3* 11.5* 11.0* 10.7*  --   --  11.8*  CREATININE 0.68  --  0.68 0.68 0.59  --   --   --  0.53  VANCOTROUGH  --   --   --   --   --   --  8*  --   --   VANCOPEAK  --   --   --   --   --   --   --  37  --    < > = values in this interval not displayed.     Estimated Creatinine Clearance: 40.2 mL/min (by C-G formula based on SCr of 0.53 mg/dL).    No Known Allergies   Tawnya Crook, PharmD, BCPS Clinical Pharmacist 06/01/2022 12:09 PM

## 2022-06-02 ENCOUNTER — Encounter (HOSPITAL_COMMUNITY): Payer: Self-pay | Admitting: Internal Medicine

## 2022-06-02 DIAGNOSIS — A419 Sepsis, unspecified organism: Secondary | ICD-10-CM | POA: Diagnosis not present

## 2022-06-02 DIAGNOSIS — N390 Urinary tract infection, site not specified: Secondary | ICD-10-CM | POA: Diagnosis not present

## 2022-06-02 LAB — PTH-RELATED PEPTIDE: PTH-related peptide: <2 pmol/L

## 2022-06-02 LAB — BASIC METABOLIC PANEL
Anion gap: 9 (ref 5–15)
BUN: 14 mg/dL (ref 8–23)
CO2: 19 mmol/L — ABNORMAL LOW (ref 22–32)
Calcium: 6.7 mg/dL — ABNORMAL LOW (ref 8.9–10.3)
Chloride: 106 mmol/L (ref 98–111)
Creatinine, Ser: 0.6 mg/dL (ref 0.44–1.00)
GFR, Estimated: >60 mL/min (ref >60)
Glucose, Bld: 106 mg/dL — ABNORMAL HIGH (ref 70–99)
Potassium: 3 mmol/L — ABNORMAL LOW (ref 3.5–5.1)
Sodium: 134 mmol/L — ABNORMAL LOW (ref 135–145)

## 2022-06-02 MED ORDER — MORPHINE SULFATE (PF) 2 MG/ML IV SOLN: 2.0000 mg | INTRAVENOUS | Status: DC | PRN

## 2022-06-02 MED ORDER — POLYETHYLENE GLYCOL 3350 17 G PO PACK
17.0000 g | PACK | Freq: Every day | ORAL | Status: DC
  Filled 2022-06-02: qty 1

## 2022-06-02 MED ORDER — OXYCODONE HCL 5 MG PO TABS
5.0000 mg | ORAL_TABLET | Freq: Four times a day (QID) | ORAL | Status: DC | PRN
  Administered 2022-06-02: 5 mg via ORAL
  Filled 2022-06-02: qty 1

## 2022-06-02 MED ORDER — DOXYCYCLINE HYCLATE 100 MG PO TABS
100.0000 mg | ORAL_TABLET | Freq: Two times a day (BID) | ORAL | Status: AC
  Administered 2022-06-02 – 2022-06-04 (×5): 100 mg via ORAL
  Filled 2022-06-02 (×5): qty 1

## 2022-06-02 NOTE — Progress Notes (Signed)
Pt has been reviewed for consideration for [facility]. At this time she does not meet criteria for the [facility] as she is taking routine medications orally without difficulty and she does not have a symptom burden requiring IV comfort medications.  I have talked to her daughter Gae Dry about this and also about patient going home with Hospice support. [name] is in agreement for patient to come home with Hospice support however she is in [place] for her son's wedding and will not come home until around midnight Monday.  She will need Tuesday to make room for DME which could be delivered Tuesday evening with tentative plan for patient to discharge home Wednesday morning.  [name] is in agreement to this plan, however, if patient does develop a symptom burden that requires the use of IV comfort medication in the interim, she would like for her to be reconsidered for [facility].   Hospice will continue to monitor remotely daily for changes and assist with discharge planning.  Feel free to call Hospice liaison anytime with questions or concerns.  Lin Landsman, RN BSN [facility] [phone].

## 2022-06-02 NOTE — Progress Notes (Signed)
PROGRESS NOTE  Brandy Wheeler  [id number] DOB: 1932-01-23 DOA: 05/23/2022 PCP: Pcp, No   Brief Narrative: Patient is 89 year old female with history of bladder cancer status post robotic cystectomy, ileal conduit diversion, GERD, anemia who presented for the evaluation of altered mental status.  She was found to have sepsis secondary to UTI.  Started on antibiotics.  Hospital course remarkable for persistent altered mental status, severe hyperglycemia, right lower extremity pain with suspicion of malignancy versus osteomyelitis.   Underwent CT-guided bone biopsy of right anterior bone lesion by IR on 2/6 which showed evidence of metastasis.  Goals of care discussed, now currently she is on comfort care.she did not qualify for residential hospice, plan for discharge home with hospice whenever possible.  TOC, hospice team following  Assessment & Plan:  Principal Problem:   Sepsis due to urinary tract infection ([place]) Active Problems:   Bladder cancer (HCC)   Hypercalcemia   Hyperkalemia   Normocytic anemia   Acute metabolic encephalopathy   Protein-calorie malnutrition, severe   Hypokalemia   Acute cystitis with hematuria   Acute metabolic encephalopathy: Unclear etiology.  Initially thought to be from hypercalcemia  which has resolved.  CT head did not show any acute intracranial abnormalities.  MRI did not show any acute findings, showed chronic microvascular changes, chronic infarcts.  Minimize narcotics, sedatives. Mental status has  improved and she is mostly oriented now.  Severe sepsis/UTI: Present on admission.  Procalcitonin negative.  Urine culture showed MRSA and Streptococcus mitis/oralis.  Blood cultures have not shown any growth.  ID was following.  Abx changed to doxy now. On comfort care.  Right lower extremity lesion/right ankle/leg pain: CT tibia/fibula suggested possible osteomyelitis.  Case was discussed with on-call for orthopedics, Dr. Grandville Silos who recommended MRI  versus CT-guided biopsy.  MRI tibia/fibula showed widespread osseous abnormalities throughout both lower legs with suspicion of metastatic disease / multiple myeloma.  TTE did not show any vegetation.  Underwent IR guided bone biopsy/culture.  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  Vitamin D level normal.  PT is low.  Given Zometa, calcitonin.  Calcium level currently stable.    Normocytic anemia: Hemoglobin dropped to the range of 7.  Low iron as per iron studies. S/p a unit of blood transfusion.  Last  Hemoglobin in the range of 9.  Now on comfort care  Left tibia/fibula fracture: Follows with orthopedic surgery, has casted left leg, non weightbearing on left side.  On comfort care  Oligometastatic bladder cancer: Follows with oncology and urology.  Recent history of robotic cystectomy with node dissection and conduit diversion.  No on comfort care  Severe protein calorie malnutrition: Nutrition was  following.  Continues to have poor oral intake.  Goals of care: Elderly patient with multiple comorbidities, history of bladder cancer now with possible another malignancy.  Overall prognosis is poor.  Goals of care discussed with patient and  daughter.  Care transitioned to comfort.  Plan for discharge to home with hospice whenever possible    Nutrition Problem: Severe Malnutrition Etiology: chronic illness, cancer and cancer related treatments    DVT prophylaxis:heparin injection 5,000 Units Start: 05/29/22 2200     Code Status: DNR  Family Communication: Discussed with granddaughter at bedside on 2/10  Patient status: Inpatient  Patient is from : Home  Anticipated discharge to: Home with hospice  Estimated DC date: Waiting for arrangement  at home.  Medically stable for discharge whenever possible   Consultants: ID,oncology,palliative  care  Procedures: Bone biopsy  Antimicrobials:  Anti-infectives (From admission, onward)    Start     Dose/Rate Route Frequency Ordered Stop   06/02/22 1000  doxycycline (VIBRA-TABS) tablet 100 mg        100 mg Oral Every 12 hours 06/02/22 0748 06/05/22 0959   05/27/22 1000  vancomycin (VANCOREADY) IVPB 750 mg/150 mL  Status:  Discontinued        750 mg 150 mL/hr over 60 Minutes Intravenous Every 24 hours 05/26/22 1147 06/02/22 0748   05/26/22 1115  vancomycin (VANCOCIN) IVPB 1000 mg/200 mL premix        1,000 mg 200 mL/hr over 60 Minutes Intravenous  Once 05/26/22 1022 05/26/22 1241   05/23/22 2000  ceFEPIme (MAXIPIME) 2 g in sodium chloride 0.9 % 100 mL IVPB  Status:  Discontinued        2 g 200 mL/hr over 30 Minutes Intravenous Every 12 hours 05/23/22 1935 05/26/22 1002   05/23/22 1930  ceFEPIme (MAXIPIME) 2 g in sodium chloride 0.9 % 100 mL IVPB  Status:  Discontinued        2 g 200 mL/hr over 30 Minutes Intravenous  Once 05/23/22 1923 05/23/22 1935   05/23/22 1615  cefTRIAXone (ROCEPHIN) 2 g in sodium chloride 0.9 % 100 mL IVPB        2 g 200 mL/hr over 30 Minutes Intravenous  Once 05/23/22 1614 05/23/22 1740       Subjective: Patient seen and examined at bedside today.  Overall looks comfortable.  Not in any kind of distress.  Denies any complaints.  Objective: Vitals:   06/01/22 0500 06/01/22 0544 06/02/22 0606 06/02/22 1028  BP:  134/64 (!) 144/49 (!) 130/59  Pulse:  (!) 105 (!) 106 99  Resp:  18 16   Temp:  98.6 F (37 C) 98.1 F (36.7 C) 97.7 F (36.5 C)  TempSrc:  Oral Oral Oral  SpO2:  90% 97% 98%  Weight: 57.2 kg  56.7 kg   Height:        Intake/Output Summary (Last 24 hours) at 06/02/2022 1127 Last data filed at 06/02/2022 [id number] Gross per 24 hour  Intake --  Output 525 ml  Net -525 ml   Filed Weights   05/31/22 0500 06/01/22 0500 06/02/22 0606  Weight: 58.1 kg 57.2 kg 56.7 kg    Examination:   General exam: Overall comfortable, not in distress,  weak, deconditioned HEENT: PERRL Respiratory system:  no wheezes or crackles  Cardiovascular system: S1 & S2 heard, RRR.  Gastrointestinal system: Abdomen is nondistended, soft and nontender. Central nervous system: Alert and most oriented Extremities: Trace lower EXTR edema, cast on the left leg    Data Reviewed: I have personally reviewed following labs and imaging studies  CBC: Recent Labs  Lab 05/28/22 0555 05/29/22 0540 05/30/22 0517 05/30/22 2215 05/31/22 0602 06/01/22 0942  WBC 11.3* 11.5* 11.0*  --  10.7* 11.8*  HGB 7.9* 7.4* 7.1* 8.4* 8.4* 9.0*  HCT 25.6* 24.6* 23.5* 26.6* 26.6* 28.9*  MCV 89.2 89.1 88.3  --  84.4 84.0  PLT 265 243 225  --  215 [id number]   Basic Metabolic Panel: Recent Labs  Lab 05/28/22 0555 05/29/22 0540 05/30/22 0517 06/01/22 0942 06/02/22 0623  NA 139 139 136 132* 134*  K 3.3* 3.8 3.5 3.0* 3.0*  CL 113* 116* 111 109 106  CO2 19* 19* 18* 19* 19*  GLUCOSE 157* 148* 145* 130* 106*  BUN 23 22 22 13 14  $ CREATININE 0.68 0.68 0.59 0.53 0.60  CALCIUM 8.3* 7.9* 7.5* 6.7* 6.7*     Recent Results (from the past 240 hour(s))  Urine Culture (for pregnant, neutropenic or urologic patients or patients with an indwelling urinary catheter)     Status: Abnormal   Collection Time: 05/23/22  3:50 PM   Specimen: Urine, Clean Catch  Result Value Ref Range Status   Specimen Description   Final    URINE, CLEAN CATCH Performed at [facility], [place] [address], [place], [place] [address]    Special Requests   Final    NONE Performed at [facility], [place] [address] [place], [place] [address]    Culture (A)  Final    >=100,000 COLONIES/mL METHICILLIN RESISTANT STAPHYLOCOCCUS AUREUS 60,000 COLONIES/mL STREPTOCOCCUS MITIS/ORALIS    Report Status 05/26/2022 FINAL  Final   Organism ID, Bacteria METHICILLIN RESISTANT STAPHYLOCOCCUS AUREUS (A)  Final      Susceptibility   Methicillin resistant staphylococcus aureus - MIC*     CIPROFLOXACIN >=8 RESISTANT Resistant     GENTAMICIN <=0.5 SENSITIVE Sensitive     NITROFURANTOIN <=16 SENSITIVE Sensitive     OXACILLIN >=4 RESISTANT Resistant     TETRACYCLINE <=1 SENSITIVE Sensitive     VANCOMYCIN 1 SENSITIVE Sensitive     TRIMETH/SULFA <=10 SENSITIVE Sensitive     CLINDAMYCIN <=0.25 SENSITIVE Sensitive     RIFAMPIN <=0.5 SENSITIVE Sensitive     Inducible Clindamycin NEGATIVE Sensitive     * >=100,000 COLONIES/mL METHICILLIN RESISTANT STAPHYLOCOCCUS AUREUS  Culture, blood (Routine X 2) w Reflex to ID Panel     Status: None   Collection Time: 05/23/22  4:00 PM   Specimen: BLOOD  Result Value Ref Range Status   Specimen Description   Final    BLOOD PORTA CATH Performed at [facility] [address], [place], [place] [address]    Special Requests   Final    BOTTLES DRAWN AEROBIC AND ANAEROBIC Blood Culture results may not be optimal due to an inadequate volume of blood received in culture bottles Performed at [facility] [address], [place], [place] [address]    Culture   Final    NO GROWTH 5 DAYS Performed at [facility], [place] [address] [place], [place] [address]    Report Status 05/28/2022 FINAL  Final  Resp panel by RT-PCR (RSV, Flu A&B, Covid) Anterior Nasal Swab     Status: None   Collection Time: 05/23/22  4:02 PM   Specimen: Anterior Nasal Swab  Result Value Ref Range Status   SARS Coronavirus 2 by RT PCR NEGATIVE NEGATIVE Final    Comment: (NOTE) SARS-CoV-2 target nucleic acids are NOT DETECTED.  The SARS-CoV-2 RNA is generally detectable in upper respiratory specimens during the acute phase of infection. The lowest concentration of SARS-CoV-2 viral copies this assay can detect is 138 copies/mL. A negative result does not preclude SARS-Cov-2 infection and should not be used as the sole basis for treatment or other patient management decisions. A negative result may occur with  improper  specimen collection/handling, submission of specimen other than nasopharyngeal swab, presence of viral mutation(s) within the areas targeted by this assay, and inadequate number of viral copies(<138 copies/mL). A negative result must be combined with clinical observations, patient history, and epidemiological information. The expected result is Negative.  Fact Sheet for Patients:  EntrepreneurPulse.com.au  Fact Sheet for Healthcare Providers:  IncredibleEmployment.be  This test is no t yet approved or cleared by the [place] FDA and  has been authorized for detection and/or diagnosis of SARS-CoV-2 by FDA under an Emergency Use Authorization (EUA). This EUA will remain  in effect (meaning this test can be used) for the duration of the COVID-19 declaration under Section 564(b)(1) of the Act, 21 U.S.C.section 360bbb-3(b)(1), unless the authorization is terminated  or revoked sooner.       Influenza A by PCR NEGATIVE NEGATIVE Final   Influenza B by PCR NEGATIVE NEGATIVE Final    Comment: (NOTE) The Xpert Xpress SARS-CoV-2/FLU/RSV plus assay is intended as an aid in the diagnosis of influenza from Nasopharyngeal swab specimens and should not be used as a sole basis for treatment. Nasal washings and aspirates are unacceptable for Xpert Xpress SARS-CoV-2/FLU/RSV testing.  Fact Sheet for Patients: EntrepreneurPulse.com.au  Fact Sheet for Healthcare Providers: IncredibleEmployment.be  This test is not yet approved or cleared by the [place] FDA and has been authorized for detection and/or diagnosis of SARS-CoV-2 by FDA under an Emergency Use Authorization (EUA). This EUA will remain in effect (meaning this test can be used) for the duration of the COVID-19 declaration under Section 564(b)(1) of the Act, 21 U.S.C. section 360bbb-3(b)(1), unless the authorization is terminated or revoked.     Resp  Syncytial Virus by PCR NEGATIVE NEGATIVE Final    Comment: (NOTE) Fact Sheet for Patients: EntrepreneurPulse.com.au  Fact Sheet for Healthcare Providers: IncredibleEmployment.be  This test is not yet approved or cleared by the [place] FDA and has been authorized for detection and/or diagnosis of SARS-CoV-2 by FDA under an Emergency Use Authorization (EUA). This EUA will remain in effect (meaning this test can be used) for the duration of the COVID-19 declaration under Section 564(b)(1) of the Act, 21 U.S.C. section 360bbb-3(b)(1), unless the authorization is terminated or revoked.  Performed at [facility], [place] [address], [place], [place] [address]   Culture, blood (Routine X 2) w Reflex to ID Panel     Status: None   Collection Time: 05/23/22  9:47 PM   Specimen: BLOOD  Result Value Ref Range Status   Specimen Description   Final    BLOOD BLOOD RIGHT HAND Performed at [facility] [address] [place], [place] [address]    Special Requests   Final    BOTTLES DRAWN AEROBIC ONLY Blood Culture results may not be optimal due to an inadequate volume of blood received in culture bottles Performed at [facility] [address] [place], [place] [address]    Culture   Final    NO GROWTH 5 DAYS Performed at [facility], [place] [address], [place],  [address]    Report Status 05/28/2022 FINAL  Final  Respiratory (~20 pathogens) panel by PCR     Status: None   Collection Time: 05/24/22  6:39 PM   Specimen: Nasopharyngeal Swab; Respiratory  Result Value Ref Range Status   Adenovirus NOT DETECTED NOT DETECTED Final   Coronavirus 229E NOT DETECTED NOT DETECTED Final    Comment: (NOTE) The Coronavirus on the Respiratory Panel, DOES NOT test for the novel  Coronavirus (2019 nCoV)    Coronavirus HKU1 NOT DETECTED NOT DETECTED Final   Coronavirus NL63 NOT DETECTED NOT  DETECTED Final   Coronavirus OC43 NOT DETECTED NOT DETECTED Final   Metapneumovirus NOT DETECTED NOT DETECTED Final   Rhinovirus / Enterovirus NOT  DETECTED NOT DETECTED Final   Influenza A NOT DETECTED NOT DETECTED Final   Influenza B NOT DETECTED NOT DETECTED Final   Parainfluenza Virus 1 NOT DETECTED NOT DETECTED Final   Parainfluenza Virus 2 NOT DETECTED NOT DETECTED Final   Parainfluenza Virus 3 NOT DETECTED NOT DETECTED Final   Parainfluenza Virus 4 NOT DETECTED NOT DETECTED Final   Respiratory Syncytial Virus NOT DETECTED NOT DETECTED Final   Bordetella pertussis NOT DETECTED NOT DETECTED Final   Bordetella Parapertussis NOT DETECTED NOT DETECTED Final   Chlamydophila pneumoniae NOT DETECTED NOT DETECTED Final   Mycoplasma pneumoniae NOT DETECTED NOT DETECTED Final    Comment: Performed at [facility], [place] [address], [place], [place] [address]  Aerobic/Anaerobic Culture w Gram Stain (surgical/deep wound)     Status: None (Preliminary result)   Collection Time: 05/29/22  3:10 PM   Specimen: Bone; Tissue  Result Value Ref Range Status   Specimen Description   Final    BONE BIOPSY TIBIA RIGHT Performed at [facility], [place] [address], [place], [place] [address]    Special Requests   Final    NONE Performed at [facility], [place] [address] [place], [place] [address]    Gram Stain NO WBC SEEN NO ORGANISMS SEEN   Final   Culture   Final    NO GROWTH 3 DAYS NO ANAEROBES ISOLATED; CULTURE IN PROGRESS FOR 5 DAYS SEND RESULTS TO DR Quentin Mulling AND DR NETTEY Performed at [facility], [address] [place], [place] [address]    Report Status PENDING  Incomplete     Radiology Studies: No results found.  Scheduled Meds:  (feeding supplement) PROSource Plus  30 mL Oral TID BM   Chlorhexidine Gluconate Cloth  6 each Topical Daily   doxycycline  100 mg Oral Q12H   [START ON 06/03/2022] ferrous sulfate  325 mg Oral Q breakfast   heparin  5,000  Units Subcutaneous Q8H   metoprolol tartrate  12.5 mg Oral BID   multivitamin  15 mL Oral Daily   pantoprazole  40 mg Oral Daily   sodium chloride flush  3 mL Intravenous Q12H   Continuous Infusions:     LOS: 10 days   Shelly Coss, MD [facility] P2/01/2023, 11:27 AM

## 2022-06-02 NOTE — Progress Notes (Signed)
Daily Progress Note   Patient Name: Brandy Wheeler       Date: 06/02/2022 DOB: 04-17-1932  Age: 89 y.o. MRN#: [id number] Attending Physician: Shelly Coss, MD Primary Care Physician: Pcp, No Admit Date: 05/23/2022  Reason for Consultation/Follow-up: Establishing goals of care  Subjective:  Appears weak, resting in bed   Length of Stay: 10  Current Medications: Scheduled Meds:   (feeding supplement) PROSource Plus  30 mL Oral TID BM   Chlorhexidine Gluconate Cloth  6 each Topical Daily   doxycycline  100 mg Oral Q12H   [START ON 06/03/2022] ferrous sulfate  325 mg Oral Q breakfast   heparin  5,000 Units Subcutaneous Q8H   metoprolol tartrate  12.5 mg Oral BID   multivitamin  15 mL Oral Daily   pantoprazole  40 mg Oral Daily   sodium chloride flush  3 mL Intravenous Q12H    Continuous Infusions:    PRN Meds: acetaminophen **OR** acetaminophen, alum & mag hydroxide-simeth, lip balm, ondansetron **OR** ondansetron (ZOFRAN) IV, senna-docusate  Physical Exam         Weak appearing elderly lady Generalized weakness, appears chronically ill Regular work of breathing Shallow clear breath sounds S1-S2 Abdomen not distended  Vital Signs: BP (!) 144/49 (BP Location: Left Arm)   Pulse (!) 106   Temp 98.1 F (36.7 C) (Oral)   Resp 16   Ht 5' 3"$  (1.6 m)   Wt 56.7 kg   SpO2 97%   BMI 22.14 kg/m  SpO2: SpO2: 97 % O2 Device: O2 Device: Nasal Cannula O2 Flow Rate: O2 Flow Rate (L/min): 2 L/min  Intake/output summary:  Intake/Output Summary (Last 24 hours) at 06/02/2022 1022 Last data filed at 06/02/2022 [id number] Gross per 24 hour  Intake --  Output 525 ml  Net -525 ml    LBM: Last BM Date : 06/01/22 Baseline Weight: Weight: 63 kg Most recent weight: Weight: 56.7 kg        Palliative Assessment/Data:      Patient Active Problem List   Diagnosis Date Noted   Hypokalemia 05/26/2022   Acute cystitis with hematuria 05/26/2022   Protein-calorie malnutrition, severe 05/24/2022   Sepsis due to urinary tract infection ([place]) 05/23/2022   Hypercalcemia 05/23/2022   Hyperkalemia 05/23/2022   Normocytic anemia [id number]   Acute metabolic encephalopathy [id number]  Malnutrition of moderate degree 05/11/2022   Surgery, elective 05/09/2022   Bladder cancer ([place]) 05/09/2022   Health education 04/30/2022   Encounter for ostomy care education 04/11/2022   Attention to urostomy [facility]) 04/11/2022   Port-A-Cath in place 11/16/2021   Malignant neoplasm of urinary bladder ([place]) 11/03/2021   Bladder tumor 10/09/2021   Fracture of distal fibula 10/19/2013    Palliative Care Assessment & Plan   Patient Profile:    Assessment:  89 year old female with  history of 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 She completed her planned chemotherapy in October 2023 and had her surgery on 05/09/2022. 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 She has been admitted with altered mental status weakness and change in performance status related to sepsis from urinary tract infection, hypercalcemia, likely delirium, anemia Hospital course also complicated by MRSA and Streptococcus mitis sepsis from urinary source on vancomycin. She is also status post hypercalcemia status post Zometa and calcitonin with resolution-likely from dehydration plus bone mets.   Palliative care following for CODE STATUS and goals of care discussions.  Recommendations/Plan: DNR  DNI Residential hospice.    Goals of Care and Additional Recommendations: Limitations on Scope of Treatment: Full Comfort Care  Code Status:    Code Status Orders  (From admission, onward)           Start     Ordered   06/01/22 1231  Do not attempt resuscitation (DNR)  Continuous       Question Answer Comment  If patient has no pulse and is not breathing Do Not Attempt Resuscitation   If patient has a pulse and/or is breathing: Medical Treatment Goals COMFORT MEASURES: Keep clean/warm/dry, use medication by any route; positioning, wound care and other measures to relieve pain/suffering; use oxygen, suction/manual treatment of airway obstruction for comfort; do not transfer unless for comfort needs.   Consent: Discussion documented in EHR or advanced directives reviewed      06/01/22 1231           Code Status History     Date Active Date Inactive Code Status Order ID Comments User Context   05/23/2022 1923 06/01/2022 1231 Full Code [id number]  Lenore Cordia, MD ED   05/09/2022 2049 05/17/2022 2139 Full Code [id number]  Lattie Corns Inpatient   10/09/2021 1617 10/10/2021 1622 Full Code [id number]  Lucas Mallow, MD Inpatient       Prognosis:  < 2 weeks  Discharge Planning: Hospice facility  Care plan was discussed with  IDT    Thank you for allowing the Palliative Medicine Team to assist in the care of this patient.  low MDM     Greater than 50%  of this time was spent counseling and coordinating care related to the above assessment and plan.  Loistine Chance, MD  Please contact Palliative Medicine Team phone at [phone] for questions and concerns.

## 2022-06-02 NOTE — TOC Progression Note (Signed)
Transition of Care [facility]) - Progression Note    Patient Details  Name: Brandy Wheeler MRN: [id number] Date of Birth: 01/04/1932  Transition of Care [facility]) CM/SW Contact  Henrietta Dine, RN Phone Number: 06/02/2022, 10:27 AM  Clinical Narrative:    Notified by Dr Tawanna Solo pt's dtr wants to take her home w/ hospice; called pt's dtr Gae Dry [phone]) to clarify d/c plan; she says pt was to be evaluated for residential hospice but she did receive results of eval; she would still like for pt to go residential hospice if she qualifies; Ms Claudia Desanctis says she is in [place] at her son's wedding and she will home on 05/25/22; called [facility] [place] ([phone] spoke w/ Tharon Aquas; Tharon Aquas says at this time pt does not qualify for residential hospice; she will call pt's dtr to discuss results of eval and possible home w/ hospice; Tharon Aquas says she will update TOC about conversation; awaiting return call.   Expected Discharge Plan: [place] Barriers to Discharge: Continued Medical Work up  Expected Discharge Plan and Services   Discharge Planning Services: CM Consult   Living arrangements for the past 2 months: [place]                                       Social Determinants of Health (SDOH) Interventions SDOH Screenings   Food Insecurity: No Food Insecurity (05/28/2022)  Housing: Low Risk  (05/28/2022)  Transportation Needs: No Transportation Needs (05/28/2022)  Utilities: Not At Risk (05/28/2022)  Tobacco Use: Low Risk  (05/28/2022)    Readmission Risk Interventions     No data to display

## 2022-06-03 ENCOUNTER — Encounter (HOSPITAL_COMMUNITY): Payer: Self-pay | Admitting: Internal Medicine

## 2022-06-03 DIAGNOSIS — N390 Urinary tract infection, site not specified: Secondary | ICD-10-CM | POA: Diagnosis not present

## 2022-06-03 DIAGNOSIS — A419 Sepsis, unspecified organism: Secondary | ICD-10-CM | POA: Diagnosis not present

## 2022-06-03 LAB — AEROBIC/ANAEROBIC CULTURE W GRAM STAIN (SURGICAL/DEEP WOUND)
Culture: NO GROWTH
Gram Stain: NONE SEEN

## 2022-06-03 LAB — GLUCOSE, CAPILLARY
Glucose-Capillary: 120 mg/dL — ABNORMAL HIGH (ref 70–99)
Glucose-Capillary: 122 mg/dL — ABNORMAL HIGH (ref 70–99)
Glucose-Capillary: 114 mg/dL — ABNORMAL HIGH (ref 70–99)

## 2022-06-03 MED ORDER — PHENOL 1.4 % MT LIQD
1.0000 | OROMUCOSAL | Status: DC | PRN
  Filled 2022-06-03: qty 177

## 2022-06-03 NOTE — Progress Notes (Signed)
Ileo-conduit pouch cont to leak in spite of numerous attempts to address various issues. Noted new leaking approx 1 hr after CN from 4th floor up to reapply new pouch. Also noted large red rash below conduit from constant leaking. [place] nurse consult done to assist.  Stents remain intact. Will cont to monitor

## 2022-06-03 NOTE — Progress Notes (Signed)
Called to assist RN with ostomy change due to current ostomy pouch leaking. Stoma is red, slightly above skin level, with leaking present at 9:00 o'clock. 2 stints intact. Peristoma has skin intact with erythema noted. Used 2 piece ostomy pouch with barrier ring, stoma powder. Attached pouch to bedside drainage bag. Applied barrier cream and antifungal powder to red area around ostomy pouch. Toiya Morrish, Laurel Dimmer, RN

## 2022-06-03 NOTE — Progress Notes (Signed)
PROGRESS NOTE  Brandy Wheeler  [id number] DOB: 05/23/1931 DOA: 05/23/2022 PCP: Pcp, No   Brief Narrative: Patient is 89 year old female with history of bladder cancer status post robotic cystectomy, ileal conduit diversion, GERD, anemia who presented for the evaluation of altered mental status.  She was found to have sepsis secondary to UTI.  Started on antibiotics.  Hospital course remarkable for persistent altered mental status, severe hyperglycemia, right lower extremity pain with suspicion of malignancy versus osteomyelitis.   Underwent CT-guided bone biopsy of right anterior bone lesion by IR on 2/6 which showed evidence of metastasis.  Goals of care discussed, now currently she is on comfort care.she did not qualify for residential hospice, plan for discharge home with hospice whenever possible.  TOC, hospice team following  Assessment & Plan:  Principal Problem:   Sepsis due to urinary tract infection ([place]) Active Problems:   Bladder cancer (HCC)   Hypercalcemia   Hyperkalemia   Normocytic anemia   Acute metabolic encephalopathy   Protein-calorie malnutrition, severe   Hypokalemia   Acute cystitis with hematuria   Acute metabolic encephalopathy: Unclear etiology.  Initially thought to be from hypercalcemia  which has resolved.  CT head did not show any acute intracranial abnormalities.  MRI did not show any acute findings, showed chronic microvascular changes, chronic infarcts.  Minimize narcotics, sedatives.  Severe sepsis/UTI: Present on admission.  Procalcitonin negative.  Urine culture showed MRSA and Streptococcus mitis/oralis.  Blood cultures have not shown any growth.  ID was following.  Abx changed to doxy now. On comfort care.  Right lower extremity lesion/right ankle/leg pain: CT tibia/fibula suggested possible osteomyelitis.  Case was discussed with on-call for orthopedics, Dr. Grandville Silos who recommended MRI versus CT-guided biopsy.  MRI tibia/fibula showed widespread  osseous abnormalities throughout both lower legs with suspicion of metastatic disease / multiple myeloma.  TTE did not show any vegetation.  Underwent IR guided bone biopsy/culture.  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  Vitamin D level normal.  PT is low.  Given Zometa, calcitonin.  Normocytic anemia: Hemoglobin dropped to the range of 7.  Low iron as per iron studies. S/p a unit of blood transfusion.  Last  Hemoglobin in the range of 9.  Now on comfort care  Left tibia/fibula fracture: Follows with orthopedic surgery, has casted left leg, non weightbearing on left side.  On comfort care  Oligometastatic bladder cancer: Follows with oncology and urology.  Recent history of robotic cystectomy with node dissection and conduit diversion.  No on comfort care  Severe protein calorie malnutrition: Nutrition was  following.  Continues to have poor oral intake.  Goals of care: Elderly patient with multiple comorbidities, history of bladder cancer now with possible another malignancy.  Overall prognosis is poor.  Goals of care discussed with patient and  daughter.  Care transitioned to comfort.  Plan for discharge to home with hospice whenever possible    Nutrition Problem: Severe Malnutrition Etiology: chronic illness, cancer and cancer related treatments    DVT prophylaxis:     Code Status: DNR  Family Communication: Discussed with granddaughter at bedside on 2/10  Patient status: Inpatient  Patient is from : Home  Anticipated discharge to: Home with hospice  Estimated DC date: Waiting for arrangement at home.  Medically stable for discharge whenever possible   Consultants: ID,oncology,palliative care  Procedures: Bone biopsy  Antimicrobials:  Anti-infectives (From admission,  onward)    Start     Dose/Rate Route Frequency Ordered Stop   06/02/22  1000  doxycycline (VIBRA-TABS) tablet 100 mg        100 mg Oral Every 12 hours 06/02/22 0748 06/05/22 0959   05/27/22 1000  vancomycin (VANCOREADY) IVPB 750 mg/150 mL  Status:  Discontinued        750 mg 150 mL/hr over 60 Minutes Intravenous Every 24 hours 05/26/22 1147 06/02/22 0748   05/26/22 1115  vancomycin (VANCOCIN) IVPB 1000 mg/200 mL premix        1,000 mg 200 mL/hr over 60 Minutes Intravenous  Once 05/26/22 1022 05/26/22 1241   05/23/22 2000  ceFEPIme (MAXIPIME) 2 g in sodium chloride 0.9 % 100 mL IVPB  Status:  Discontinued        2 g 200 mL/hr over 30 Minutes Intravenous Every 12 hours 05/23/22 1935 05/26/22 1002   05/23/22 1930  ceFEPIme (MAXIPIME) 2 g in sodium chloride 0.9 % 100 mL IVPB  Status:  Discontinued        2 g 200 mL/hr over 30 Minutes Intravenous  Once 05/23/22 1923 05/23/22 1935   05/23/22 1615  cefTRIAXone (ROCEPHIN) 2 g in sodium chloride 0.9 % 100 mL IVPB        2 g 200 mL/hr over 30 Minutes Intravenous  Once 05/23/22 1614 05/23/22 1740       Subjective: Patient seen and examined at bedside today.  Lying in bed.  She was sleeping when I arrived.  Comfortable.  Not in any current distress.  On full comfort care.  Objective: Vitals:   06/02/22 0606 06/02/22 1028 06/02/22 1325 06/03/22 0554  BP: (!) 144/49 (!) 130/59 (!) 128/54 (!) 125/57  Pulse: (!) 106 99 96 96  Resp: 16  (!) 22 16  Temp: 98.1 F (36.7 C) 97.7 F (36.5 C) 98 F (36.7 C) (!) 97.5 F (36.4 C)  TempSrc: Oral Oral Oral Oral  SpO2: 97% 98% 97% 99%  Weight: 56.7 kg   56.6 kg  Height:        Intake/Output Summary (Last 24 hours) at 06/03/2022 1133 Last data filed at 06/03/2022 0900 Gross per 24 hour  Intake 200 ml  Output 450 ml  Net -250 ml   Filed Weights   06/01/22 0500 06/02/22 0606 06/03/22 0554  Weight: 57.2 kg 56.7 kg 56.6 kg    Examination:   General exam: Overall comfortable, not in distress, weak, deconditioned, lying in bed, not in any distress  Data Reviewed: I  have personally reviewed following labs and imaging studies  CBC: Recent Labs  Lab 05/28/22 0555 05/29/22 0540 05/30/22 0517 05/30/22 2215 05/31/22 0602 06/01/22 0942  WBC 11.3* 11.5* 11.0*  --  10.7* 11.8*  HGB 7.9* 7.4* 7.1* 8.4* 8.4* 9.0*  HCT 25.6* 24.6* 23.5* 26.6* 26.6* 28.9*  MCV 89.2 89.1 88.3  --  84.4 84.0  PLT 265 243 225  --  215 [id number]   Basic Metabolic Panel: Recent Labs  Lab 05/28/22 0555 05/29/22 0540 05/30/22 0517 06/01/22 0942 06/02/22 0623  NA 139 139 136 132* 134*  K 3.3* 3.8 3.5 3.0* 3.0*  CL 113* 116* 111 109 106  CO2 19* 19* 18* 19* 19*  GLUCOSE 157* 148* 145* 130* 106*  BUN 23 22 22 13 14  $ CREATININE 0.68 0.68 0.59 0.53 0.60  CALCIUM 8.3* 7.9* 7.5* 6.7* 6.7*     Recent Results (from the past 240 hour(s))  Respiratory (~20 pathogens) panel by PCR  Status: None   Collection Time: 05/24/22  6:39 PM   Specimen: Nasopharyngeal Swab; Respiratory  Result Value Ref Range Status   Adenovirus NOT DETECTED NOT DETECTED Final   Coronavirus 229E NOT DETECTED NOT DETECTED Final    Comment: (NOTE) The Coronavirus on the Respiratory Panel, DOES NOT test for the novel  Coronavirus (2019 nCoV)    Coronavirus HKU1 NOT DETECTED NOT DETECTED Final   Coronavirus NL63 NOT DETECTED NOT DETECTED Final   Coronavirus OC43 NOT DETECTED NOT DETECTED Final   Metapneumovirus NOT DETECTED NOT DETECTED Final   Rhinovirus / Enterovirus NOT DETECTED NOT DETECTED Final   Influenza A NOT DETECTED NOT DETECTED Final   Influenza B NOT DETECTED NOT DETECTED Final   Parainfluenza Virus 1 NOT DETECTED NOT DETECTED Final   Parainfluenza Virus 2 NOT DETECTED NOT DETECTED Final   Parainfluenza Virus 3 NOT DETECTED NOT DETECTED Final   Parainfluenza Virus 4 NOT DETECTED NOT DETECTED Final   Respiratory Syncytial Virus NOT DETECTED NOT DETECTED Final   Bordetella pertussis NOT DETECTED NOT DETECTED Final   Bordetella Parapertussis NOT DETECTED NOT DETECTED Final   Chlamydophila  pneumoniae NOT DETECTED NOT DETECTED Final   Mycoplasma pneumoniae NOT DETECTED NOT DETECTED Final    Comment: Performed at [facility], [place]. [address], [place], [place] [address]  Aerobic/Anaerobic Culture w Gram Stain (surgical/deep wound)     Status: None (Preliminary result)   Collection Time: 05/29/22  3:10 PM   Specimen: Bone; Tissue  Result Value Ref Range Status   Specimen Description   Final    BONE BIOPSY TIBIA RIGHT Performed at [facility], [place] [address], [place], [place] [address]    Special Requests   Final    NONE Performed at [facility], [place] [address] [place], [place] [address]    Gram Stain NO WBC SEEN NO ORGANISMS SEEN   Final   Culture   Final    NO GROWTH 4 DAYS NO ANAEROBES ISOLATED; CULTURE IN PROGRESS FOR 5 DAYS Performed at [facility] [address], [place], [place] [address]    Report Status PENDING  Incomplete     Radiology Studies: No results found.  Scheduled Meds:  (feeding supplement) PROSource Plus  30 mL Oral TID BM   Chlorhexidine Gluconate Cloth  6 each Topical Daily   doxycycline  100 mg Oral Q12H   ferrous sulfate  325 mg Oral Q breakfast   metoprolol tartrate  12.5 mg Oral BID   multivitamin  15 mL Oral Daily   pantoprazole  40 mg Oral Daily   polyethylene glycol  17 g Oral Daily   sodium chloride flush  3 mL Intravenous Q12H   Continuous Infusions:     LOS: 11 days   Shelly Coss, MD [facility] P2/02/2023, 11:33 AM

## 2022-06-03 NOTE — Progress Notes (Signed)
Daily Progress Note   Patient Name: Brandy Wheeler       Date: 06/03/2022 DOB: 10-23-1931  Age: 89 y.o. MRN#: [id number] Attending Physician: Shelly Coss, MD Primary Care Physician: Pcp, No Admit Date: 05/23/2022  Reason for Consultation/Follow-up: Establishing goals of care  Subjective:  Appears weak, resting in bed  Complains of burning sensation in throat.    Length of Stay: 11  Current Medications: Scheduled Meds:   (feeding supplement) PROSource Plus  30 mL Oral TID BM   Chlorhexidine Gluconate Cloth  6 each Topical Daily   doxycycline  100 mg Oral Q12H   ferrous sulfate  325 mg Oral Q breakfast   metoprolol tartrate  12.5 mg Oral BID   multivitamin  15 mL Oral Daily   pantoprazole  40 mg Oral Daily   polyethylene glycol  17 g Oral Daily   sodium chloride flush  3 mL Intravenous Q12H    Continuous Infusions:    PRN Meds: acetaminophen **OR** acetaminophen, alum & mag hydroxide-simeth, lip balm, morphine injection, ondansetron **OR** ondansetron (ZOFRAN) IV, oxyCODONE, senna-docusate  Physical Exam         Weak appearing elderly lady Generalized weakness, appears chronically ill Regular work of breathing Shallow clear breath sounds S1-S2 Abdomen not distended  Vital Signs: BP (!) 125/57 (BP Location: Left Arm)   Pulse 96   Temp (!) 97.5 F (36.4 C) (Oral)   Resp 16   Ht '5\' 3"'$  (1.6 m)   Wt 56.6 kg   SpO2 99%   BMI 22.10 kg/m  SpO2: SpO2: 99 % O2 Device: O2 Device: Nasal Cannula O2 Flow Rate: O2 Flow Rate (L/min): 2 L/min  Intake/output summary:  Intake/Output Summary (Last 24 hours) at 06/03/2022 1141 Last data filed at 06/03/2022 0900 Gross per 24 hour  Intake 200 ml  Output 450 ml  Net -250 ml    LBM: Last BM Date : 06/02/22 Baseline Weight:  Weight: 63 kg Most recent weight: Weight: 56.6 kg       Palliative Assessment/Data:      Patient Active Problem List   Diagnosis Date Noted   Hypokalemia 05/26/2022   Acute cystitis with hematuria 05/26/2022   Protein-calorie malnutrition, severe 05/24/2022   Sepsis due to urinary tract infection ([place]) 05/23/2022   Hypercalcemia 05/23/2022   Hyperkalemia 05/23/2022  Normocytic anemia 98/33/8250   Acute metabolic encephalopathy 53/97/6734   Malnutrition of moderate degree 05/11/2022   Surgery, elective 05/09/2022   Bladder cancer ([place]) 05/09/2022   Health education 04/30/2022   Encounter for ostomy care education 04/11/2022   Attention to urostomy [facility]) 04/11/2022   Port-A-Cath in place 11/16/2021   Malignant neoplasm of urinary bladder ([place]) 11/03/2021   Bladder tumor 10/09/2021   Fracture of distal fibula 10/19/2013    Palliative Care Assessment & Plan   Patient Profile:    Assessment:  89 year old female with  history of 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 She completed her planned chemotherapy in October 2023 and had her surgery on 05/09/2022. 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 She has been admitted with altered mental status weakness and change in performance status related to sepsis from urinary tract infection, hypercalcemia, likely delirium, anemia Hospital course also complicated by MRSA and Streptococcus mitis sepsis from urinary source on vancomycin. She is also status post hypercalcemia status post Zometa and calcitonin with resolution-likely from dehydration plus bone mets.   Palliative care following for CODE STATUS and goals  of care discussions.  Recommendations/Plan: DNR DNI Add throat spray Agree with wound care RN, discussed with bedside RN, appreciate her assessment.  D/C PO medications no longer contributing to comfort.  Patient under consideration for home with hospice versus re eval for residential hospice.    Goals of Care and Additional Recommendations: Limitations on Scope of Treatment: Full Comfort Care  Code Status:    Code Status Orders  (From admission, onward)           Start     Ordered   06/01/22 1231  Do not attempt resuscitation (DNR)  Continuous       Question Answer Comment  If patient has no pulse and is not breathing Do Not Attempt Resuscitation   If patient has a pulse and/or is breathing: Medical Treatment Goals COMFORT MEASURES: Keep clean/warm/dry, use medication by any route; positioning, wound care and other measures to relieve pain/suffering; use oxygen, suction/manual treatment of airway obstruction for comfort; do not transfer unless for comfort needs.   Consent: Discussion documented in EHR or advanced directives reviewed      06/01/22 1231           Code Status History     Date Active Date Inactive Code Status Order ID Comments User Context   05/23/2022 1923 06/01/2022 1231 Full Code [id number]  Lenore Cordia, MD ED   05/09/2022 2049 05/17/2022 2139 Full Code [id number]  Lattie Corns Inpatient   10/09/2021 1617 10/10/2021 1622 Full Code [id number]  Lucas Mallow, MD Inpatient       Prognosis:  < 2 weeks likely. Decreased PO intake, could have escalating symptom burden in coming days.   Discharge Planning: Hospice facility Versus home with hospice.  Care plan was discussed with  IDT  Daughter out of town this weekend.   Thank you for allowing the Palliative Medicine Team to assist in the care of this patient.  mod MDM     Greater than 50%  of this time was spent counseling and coordinating care related to the above assessment and  plan.  Loistine Chance, MD  Please contact Palliative  Medicine Team phone at [phone] for questions and concerns.

## 2022-06-03 NOTE — TOC Progression Note (Signed)
Transition of Care [facility]) - Progression Note    Patient Details  Name: Brandy Wheeler MRN: [id number] Date of Birth: Jan 21, 1932  [facility]) CM/SW Contact  Henrietta Dine, RN Phone Number: 06/03/2022, 11:52 AM  Clinical Narrative:    Zannie Cove, RN at [facility]; she say she spoke w/ pt's dtr Gae Dry; she says Ms Claudia Desanctis is currently in [place] for her son's wedding and she will return to [facility] on Monday night; Tharon Aquas says she and dtr planned for DME to be delivered Tuesday afternoon and pt will d/c home on Wednesday w/ agency assuming care; Dr Tawanna Solo notfied; [facility] will follow.    Expected Discharge Plan: [place] Barriers to Discharge: Continued Medical Work up  Expected Discharge Plan and Services   Discharge Planning Services: CM Consult   Living arrangements for the past 2 months: [place]                                       Social Determinants of Health (SDOH) Interventions SDOH Screenings   Food Insecurity: No Food Insecurity (05/28/2022)  Housing: Low Risk  (05/28/2022)  Transportation Needs: No Transportation Needs (05/28/2022)  Utilities: Not At Risk (05/28/2022)  Tobacco Use: Low Risk  (06/02/2022)    Readmission Risk Interventions     No data to display

## 2022-06-03 NOTE — Progress Notes (Signed)
Physical Therapy Discharge Patient Details Name: Brandy Wheeler MRN: [id number] DOB: June 04, 1931 Today's Date: 06/03/2022 Time:  -     Patient discharged from PT services secondary to  comfort care .  Please see latest therapy progress note for current level of functioning and progress toward goals.    Progress and discharge plan discussed with patient and/or caregiver:    Current plan home with hospice versus re eval for residential hospice.  Prognosis < 2 weeks per palliative care notes.       [name] 06/03/2022, 3:33 PM  Jannette Spanner PT, DPT Physical Therapist Acute Rehabilitation Services Preferred contact method: Secure Chat Weekend Pager Only: [phone] Office: [phone]

## 2022-06-04 DIAGNOSIS — N390 Urinary tract infection, site not specified: Secondary | ICD-10-CM | POA: Diagnosis not present

## 2022-06-04 DIAGNOSIS — A419 Sepsis, unspecified organism: Secondary | ICD-10-CM | POA: Diagnosis not present

## 2022-06-04 NOTE — Plan of Care (Signed)

## 2022-06-04 NOTE — Consult Note (Signed)
[place] Nurse ostomy consult note Stoma type/location: RLQ, ileal conduit Stomal assessment/size: 1" round, budded, two stents in place (1 red/1 blue).large amount of mucous Peristomal assessment: denuded skin circumferentially that extends 4 cms , however the skin irritation right lateral abdomen is dry at this time. Continues to be reddened but not warm to the touch. Similar to her surgical admission and at the time of the creation of her stoma she has mild dip in the abdominal topography at 9 o'clock  Instructed patient's bedside nurse on application of ostomy belt when patient is able to sit on the side of the bed, this will facilitate easier application of the belt.  Treatment options for stomal/peristomal skin: crusted denuded skin with ostomy powder and no sting ostomy barrier wipes; 2" skin barrier ring to protect skin and address creasing/dip  Output yellow/blood tinged urine Ostomy pouching: 1pc. Convex with 2" skin barrier ring and ostomy belt Education provided: patient to DC home with hospice care, daughter has learned ostomy care during previous admission.  Requested bedside nurse  Enrolled patient in [place] program: Yes, during previous admission  [place] Nurse will follow along with you for continued support with ostomy teaching and care [place] MSN, [place], [place], [place], [place]

## 2022-06-04 NOTE — Progress Notes (Signed)
PMT no charge note.   Chart reviewed, goals of care outlined, hospice consulted, plan currently is for home with hospice upon daughter's return from out of town. No further PMT specific recommendations, we will sign off, please call us at [phone] if we can be of further assistance.  No charge Loistine Chance MD  palliative.

## 2022-06-04 NOTE — Progress Notes (Signed)
PROGRESS NOTE  Necie Gridley  [id number] DOB: Mar 16, 1932 DOA: 05/23/2022 PCP: Pcp, No   Brief Narrative: Patient is 89 year old female with history of bladder cancer status post robotic cystectomy, ileal conduit diversion, GERD, anemia who presented for the evaluation of altered mental status.  She was found to have sepsis secondary to UTI.  Started on antibiotics.  Hospital course remarkable for persistent altered mental status, severe hyperglycemia, right lower extremity pain with suspicion of malignancy versus osteomyelitis.   Underwent CT-guided bone biopsy of right anterior bone lesion by IR on 2/6 which showed evidence of metastasis.  Goals of care discussed, now currently she is on comfort care.she did not qualify for residential hospice, plan for discharge home with hospice .  TOC, hospice team following.  Plan for discharge home with hospice on Wednesday  Assessment & Plan:  Principal Problem:   Sepsis due to urinary tract infection ([place]) Active Problems:   Bladder cancer ([place])   Hypercalcemia   Hyperkalemia   Normocytic anemia   Acute metabolic encephalopathy   Protein-calorie malnutrition, severe   Hypokalemia   Acute cystitis with hematuria   Acute metabolic encephalopathy: Unclear etiology.  Initially thought to be from hypercalcemia  which has resolved.  CT head did not show any acute intracranial abnormalities.  MRI did not show any acute findings, showed chronic microvascular changes, chronic infarcts.  Minimize narcotics, sedatives.  Severe sepsis/UTI: Present on admission.  Procalcitonin negative.  Urine culture showed MRSA and Streptococcus mitis/oralis.  Blood cultures have not shown any growth.  ID was following.  Abx changed to doxy now. On comfort care.  Right lower extremity lesion/right ankle/leg pain: CT tibia/fibula suggested possible osteomyelitis.  Case was discussed with on-call for orthopedics, Dr. Grandville Silos who recommended MRI versus CT-guided biopsy.  MRI  tibia/fibula showed widespread osseous abnormalities throughout both lower legs with suspicion of metastatic disease / multiple myeloma.  TTE did not show any vegetation.  Underwent IR guided bone biopsy/culture.  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  Vitamin D level normal.  PT is low.  Given Zometa, calcitonin.  Normocytic anemia: Hemoglobin dropped to the range of 7.  Low iron as per iron studies. S/p a unit of blood transfusion.  Last  Hemoglobin in the range of 9.  Now on comfort care  Left tibia/fibula fracture: Follows with orthopedic surgery, has casted left leg, non weightbearing on left side.  On comfort care  Oligometastatic bladder cancer: Follows with oncology and urology.  Recent history of robotic cystectomy with node dissection and conduit diversion.  No on comfort care  Severe protein calorie malnutrition: Nutrition was  following.  Continues to have poor oral intake.  Goals of care: Elderly patient with multiple comorbidities, history of bladder cancer now with possible another malignancy.  Overall prognosis is poor.  Goals of care discussed with patient and  daughter.  Care transitioned to comfort.  Plan for discharge to home with hospice whenever possible    Nutrition Problem: Severe Malnutrition Etiology: chronic illness, cancer and cancer related treatments    DVT prophylaxis:     Code Status: DNR  Family Communication: Discussed with niece at bedside on 2/12  Patient status: Inpatient  Patient is from : Home  Anticipated discharge to: Home with hospice  Estimated DC date: Waiting for arrangement at home.  Medically stable for discharge whenever possible.Likely will happen on Monday   Consultants:  ID,oncology,palliative care  Procedures: Bone biopsy  Antimicrobials:  Anti-infectives (From admission, onward)    Start      Dose/Rate Route Frequency Ordered Stop   06/02/22 1000  doxycycline (VIBRA-TABS) tablet 100 mg        100 mg Oral Every 12 hours 06/02/22 0748 06/05/22 0959   05/27/22 1000  vancomycin (VANCOREADY) IVPB 750 mg/150 mL  Status:  Discontinued        750 mg 150 mL/hr over 60 Minutes Intravenous Every 24 hours 05/26/22 1147 06/02/22 0748   05/26/22 1115  vancomycin (VANCOCIN) IVPB 1000 mg/200 mL premix        1,000 mg 200 mL/hr over 60 Minutes Intravenous  Once 05/26/22 1022 05/26/22 1241   05/23/22 2000  ceFEPIme (MAXIPIME) 2 g in sodium chloride 0.9 % 100 mL IVPB  Status:  Discontinued        2 g 200 mL/hr over 30 Minutes Intravenous Every 12 hours 05/23/22 1935 05/26/22 1002   05/23/22 1930  ceFEPIme (MAXIPIME) 2 g in sodium chloride 0.9 % 100 mL IVPB  Status:  Discontinued        2 g 200 mL/hr over 30 Minutes Intravenous  Once 05/23/22 1923 05/23/22 1935   05/23/22 1615  cefTRIAXone (ROCEPHIN) 2 g in sodium chloride 0.9 % 100 mL IVPB        2 g 200 mL/hr over 30 Minutes Intravenous  Once 05/23/22 1614 05/23/22 1740       Subjective: Patient seen and examined at bedside today.  Appears comfortable, lying in bed.  There was concern of leakage on the RLQ ileal conduit for which wound care nurse was consulted.  Niece at the bedside.  Does not look in any current distress, comfortable.  Denies any abdominal, now, nausea or vomiting.  On room air  Objective: Vitals:   06/02/22 1325 06/03/22 0554 06/03/22 1700 06/04/22 0513  BP: (!) 128/54 (!) 125/57  (!) 143/51  Pulse: 96 96  (!) 107  Resp: (!) 22 16  16  $ Temp: 98 F (36.7 C) (!) 97.5 F (36.4 C)  98 F (36.7 C)  TempSrc: Oral Oral  Oral  SpO2: 97% 99% 100% 97%  Weight:  56.6 kg  57.9 kg  Height:        Intake/Output Summary (Last 24 hours) at 06/04/2022 1303 Last data filed at 06/04/2022 0544 Gross per 24 hour  Intake 420 ml  Output 975 ml  Net -555 ml   Filed Weights   06/02/22 0606 06/03/22 0554 06/04/22 0513  Weight: 56.7  kg 56.6 kg 57.9 kg    Examination:   General exam: Comfortable, lying in bed without any distress, no edema, on room air, right lower quadrant  ileal conduit  Data Reviewed: I have personally reviewed following labs and imaging studies  CBC: Recent Labs  Lab 05/29/22 0540 05/30/22 0517 05/30/22 2215 05/31/22 0602 06/01/22 0942  WBC 11.5* 11.0*  --  10.7* 11.8*  HGB 7.4* 7.1* 8.4* 8.4* 9.0*  HCT 24.6* 23.5* 26.6* 26.6* 28.9*  MCV 89.1 88.3  --  84.4 84.0  PLT 243 225  --  215 [id number]   Basic Metabolic Panel: Recent Labs  Lab 05/29/22 0540 05/30/22 0517 06/01/22 0942 06/02/22 0623  NA 139 136 132* 134*  K 3.8 3.5 3.0* 3.0*  CL 116* 111 109 106  CO2 19* 18* 19* 19*  GLUCOSE 148* 145* 130* 106*  BUN 22 22 13 14  $ CREATININE 0.68 0.59 0.53 0.60  CALCIUM 7.9*  7.5* 6.7* 6.7*     Recent Results (from the past 240 hour(s))  Aerobic/Anaerobic Culture w Gram Stain (surgical/deep wound)     Status: None   Collection Time: 05/29/22  3:10 PM   Specimen: Bone; Tissue  Result Value Ref Range Status   Specimen Description   Final    BONE BIOPSY TIBIA RIGHT Performed at [facility], [place] [address] [place], [place] [address]    Special Requests   Final    NONE Performed at [facility], [place] [address], [place], [place] [address]    Gram Stain NO WBC SEEN NO ORGANISMS SEEN   Final   Culture   Final    No growth aerobically or anaerobically. Performed at [facility], [place] [address] [place], [place] [address]    Report Status 06/03/2022 FINAL  Final     Radiology Studies: No results found.  Scheduled Meds:  (feeding supplement) PROSource Plus  30 mL Oral TID BM   Chlorhexidine Gluconate Cloth  6 each Topical Daily   doxycycline  100 mg Oral Q12H   metoprolol tartrate  12.5 mg Oral BID   pantoprazole  40 mg Oral Daily   sodium chloride flush  3 mL Intravenous Q12H   Continuous Infusions:     LOS: 12 days   Shelly Coss,  MD [facility] P2/03/2023, 1:03 PM

## 2022-06-05 DIAGNOSIS — A419 Sepsis, unspecified organism: Secondary | ICD-10-CM | POA: Diagnosis not present

## 2022-06-05 DIAGNOSIS — N390 Urinary tract infection, site not specified: Secondary | ICD-10-CM | POA: Diagnosis not present

## 2022-06-05 NOTE — TOC Progression Note (Signed)
Transition of Care [facility]) - Progression Note    Patient Details  Name: Brandy Wheeler MRN: [id number] Date of Birth: Jan 18, 1932  Transition of Care [facility]) CM/SW Contact  Angelita Ingles, RN Phone [phone]  06/05/2022, 12:53 PM  Clinical Narrative:    CM received message from MD to determine if we are still on track for discharge home with Hospice on Wednesday. Daughter states that she is currently having furniture removed from bedroom and is expecting hospital bed to be delivered today. MD has been updated.    Expected Discharge Plan: [place] Barriers to Discharge: Continued Medical Work up  Expected Discharge Plan and Services   Discharge Planning Services: CM Consult   Living arrangements for the past 2 months: [place]                                       Social Determinants of Health (SDOH) Interventions SDOH Screenings   Food Insecurity: No Food Insecurity (05/28/2022)  Housing: Low Risk  (05/28/2022)  Transportation Needs: No Transportation Needs (05/28/2022)  Utilities: Not At Risk (05/28/2022)  Tobacco Use: Low Risk  (06/03/2022)    Readmission Risk Interventions     No data to display

## 2022-06-05 NOTE — Progress Notes (Signed)
Nutrition Brief Note  Chart reviewed. Pt now transitioned to comfort care.  No further nutrition interventions planned at this time.   Clayton Bibles, MS, RD, LDN Inpatient Clinical Dietitian Contact information available via Amion

## 2022-06-05 NOTE — Plan of Care (Signed)
Patient AOX3, disoriented to time, delayed responses and forgetful.  VSS throughout shift.  Pt denied pain, all meds given on time as ordered.  Stents intact, output WNLs.  Left leg cast in place, circulation WDLs in left leg.  POC maintained, will continue to monitor.  Problem: Fluid Volume: Goal: Hemodynamic stability will improve Outcome: Progressing   Problem: Clinical Measurements: Goal: Diagnostic test results will improve Outcome: Progressing Goal: Signs and symptoms of infection will decrease Outcome: Progressing   Problem: Respiratory: Goal: Ability to maintain adequate ventilation will improve Outcome: Progressing   Problem: Education: Goal: Knowledge of General Education information will improve Description: Including pain rating scale, medication(s)/side effects and non-pharmacologic comfort measures Outcome: Progressing   Problem: Health Behavior/Discharge Planning: Goal: Ability to manage health-related needs will improve Outcome: Progressing   Problem: Clinical Measurements: Goal: Ability to maintain clinical measurements within normal limits will improve Outcome: Progressing Goal: Will remain free from infection Outcome: Progressing Goal: Diagnostic test results will improve Outcome: Progressing Goal: Respiratory complications will improve Outcome: Progressing Goal: Cardiovascular complication will be avoided Outcome: Progressing   Problem: Activity: Goal: Risk for activity intolerance will decrease Outcome: Progressing   Problem: Nutrition: Goal: Adequate nutrition will be maintained Outcome: Progressing   Problem: Coping: Goal: Level of anxiety will decrease Outcome: Progressing   Problem: Elimination: Goal: Will not experience complications related to bowel motility Outcome: Progressing Goal: Will not experience complications related to urinary retention Outcome: Progressing   Problem: Pain Managment: Goal: General experience of comfort will  improve Outcome: Progressing   Problem: Safety: Goal: Ability to remain free from injury will improve Outcome: Progressing   Problem: Skin Integrity: Goal: Risk for impaired skin integrity will decrease Outcome: Progressing

## 2022-06-05 NOTE — Progress Notes (Signed)
PROGRESS NOTE  Brandy Wheeler  [id number] DOB: 30-Jun-1931 DOA: 05/23/2022 PCP: Pcp, No   Brief Narrative: Patient is 89 year old female with history of bladder cancer status post robotic cystectomy, ileal conduit diversion, GERD, anemia who presented for the evaluation of altered mental status.  She was found to have sepsis secondary to UTI.  Started on antibiotics.  Hospital course remarkable for persistent altered mental status, severe hyperglycemia, right lower extremity pain with suspicion of malignancy versus osteomyelitis.   Underwent CT-guided bone biopsy of right anterior bone lesion by IR on 2/6 which showed evidence of metastasis.  Goals of care discussed, now currently she is on comfort care.she did not qualify for residential hospice, plan for discharge home with hospice .  TOC, hospice team following.  Plan for discharge home with hospice likely tomorrow  Assessment & Plan:  Principal Problem:   Sepsis due to urinary tract infection ([place]) Active Problems:   Bladder cancer ([place])   Hypercalcemia   Hyperkalemia   Normocytic anemia   Acute metabolic encephalopathy   Protein-calorie malnutrition, severe   Hypokalemia   Acute cystitis with hematuria   Acute metabolic encephalopathy: Unclear etiology.  Initially thought to be from hypercalcemia  which has resolved.  CT head did not show any acute intracranial abnormalities.  MRI did not show any acute findings, showed chronic microvascular changes, chronic infarcts.  Minimize narcotics, sedatives.  Severe sepsis/UTI: Present on admission.  Procalcitonin negative.  Urine culture showed MRSA and Streptococcus mitis/oralis.  Blood cultures have not shown any growth.  ID was following. Completed abx course On comfort care.  Right lower extremity lesion/right ankle/leg pain: CT tibia/fibula suggested possible osteomyelitis.  Case was discussed with on-call for orthopedics, Dr. Grandville Silos who recommended MRI versus CT-guided biopsy.  MRI  tibia/fibula showed widespread osseous abnormalities throughout both lower legs with suspicion of metastatic disease / multiple myeloma.  TTE did not show any vegetation.  Underwent IR guided bone biopsy/culture.  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  Vitamin D level normal.  PT is low.  Given Zometa, calcitonin.  Normocytic anemia: Hemoglobin dropped to the range of 7.  Low iron as per iron studies. S/p a unit of blood transfusion.  Last  Hemoglobin in the range of 9.  Now on comfort care  Left tibia/fibula fracture: Follows with orthopedic surgery, has casted left leg, non weightbearing on left side.  On comfort care  Oligometastatic bladder cancer: Follows with oncology and urology.  Recent history of robotic cystectomy with node dissection and conduit diversion.  No on comfort care  Severe protein calorie malnutrition: Nutrition was  following.  Continues to have poor oral intake.  Goals of care: Elderly patient with multiple comorbidities, history of bladder cancer now with possible another malignancy.  Overall prognosis is poor.  Goals of care discussed with patient and  daughter.  Care transitioned to comfort.  Plan for discharge to home with hospice whenever possible    Nutrition Problem: Severe Malnutrition Etiology: chronic illness, cancer and cancer related treatments    DVT prophylaxis:     Code Status: DNR  Family Communication: Discussed with niece at bedside on 2/12  Patient status: Inpatient  Patient is from : Home  Anticipated discharge to: Home with hospice  Estimated DC date: Waiting for arrangement at home.  Likely to home with hospice tomorrow.  Exact timing not sure.  TOC following  Consultants:  ID,oncology,palliative care  Procedures: Bone biopsy  Antimicrobials:  Anti-infectives (From admission, onward)    Start      Dose/Rate Route Frequency Ordered Stop   06/02/22 1000  doxycycline (VIBRA-TABS) tablet 100 mg        100 mg Oral Every 12 hours 06/02/22 0748 06/05/22 0959   05/27/22 1000  vancomycin (VANCOREADY) IVPB 750 mg/150 mL  Status:  Discontinued        750 mg 150 mL/hr over 60 Minutes Intravenous Every 24 hours 05/26/22 1147 06/02/22 0748   05/26/22 1115  vancomycin (VANCOCIN) IVPB 1000 mg/200 mL premix        1,000 mg 200 mL/hr over 60 Minutes Intravenous  Once 05/26/22 1022 05/26/22 1241   05/23/22 2000  ceFEPIme (MAXIPIME) 2 g in sodium chloride 0.9 % 100 mL IVPB  Status:  Discontinued        2 g 200 mL/hr over 30 Minutes Intravenous Every 12 hours 05/23/22 1935 05/26/22 1002   05/23/22 1930  ceFEPIme (MAXIPIME) 2 g in sodium chloride 0.9 % 100 mL IVPB  Status:  Discontinued        2 g 200 mL/hr over 30 Minutes Intravenous  Once 05/23/22 1923 05/23/22 1935   05/23/22 1615  cefTRIAXone (ROCEPHIN) 2 g in sodium chloride 0.9 % 100 mL IVPB        2 g 200 mL/hr over 30 Minutes Intravenous  Once 05/23/22 1614 05/23/22 1740       Subjective: Seen and examined at bedside today.  Hemodynamically stable.  Comfortably lying in bed.  Appears weak, deconditioned, continues to have poor oral intake.  Not in distress.  On comfort care  Objective: Vitals:   06/03/22 1700 06/04/22 0513 06/04/22 2021 06/05/22 0535  BP:  (!) 143/51 (!) 120/51 (!) 130/52  Pulse:  (!) 107 (!) 110 (!) 107  Resp:  16 15 16  $ Temp:  98 F (36.7 C) 99 F (37.2 C) 97.8 F (36.6 C)  TempSrc:  Oral Oral Oral  SpO2: 100% 97% 97% 96%  Weight:  57.9 kg  56.4 kg  Height:        Intake/Output Summary (Last 24 hours) at 06/05/2022 1305 Last data filed at 06/05/2022 0956 Gross per 24 hour  Intake 237 ml  Output 150 ml  Net 87 ml   Filed Weights   06/03/22 0554 06/04/22 0513 06/05/22 0535  Weight: 56.6 kg 57.9 kg 56.4 kg    Examination:   General exam: Overall comfortable, weak, chronically ill looking, right lower  quadrant  ileal conduit.  Data Reviewed: I have personally reviewed following labs and imaging studies  CBC: Recent Labs  Lab 05/30/22 0517 05/30/22 2215 05/31/22 0602 06/01/22 0942  WBC 11.0*  --  10.7* 11.8*  HGB 7.1* 8.4* 8.4* 9.0*  HCT 23.5* 26.6* 26.6* 28.9*  MCV 88.3  --  84.4 84.0  PLT 225  --  215 [id number]   Basic Metabolic Panel: Recent Labs  Lab 05/30/22 0517 06/01/22 0942 06/02/22 0623  NA 136 132* 134*  K 3.5 3.0* 3.0*  CL 111 109 106  CO2 18* 19* 19*  GLUCOSE 145* 130* 106*  BUN 22 13 14  $ CREATININE 0.59 0.53 0.60  CALCIUM 7.5* 6.7* 6.7*     Recent Results (from the past 240 hour(s))  Aerobic/Anaerobic Culture w Gram Stain (surgical/deep wound)     Status: None   Collection Time: 05/29/22  3:10 PM   Specimen: Bone; Tissue  Result Value Ref Range Status  Specimen Description   Final    BONE BIOPSY TIBIA RIGHT Performed at [place] [address] [place], [place] [address]    Special Requests   Final    NONE Performed at [facility], [place] [address] [place], [place] [address]    Gram Stain NO WBC SEEN NO ORGANISMS SEEN   Final   Culture   Final    No growth aerobically or anaerobically. Performed at [facility], [place] [address] [place],  [address]    Report Status 06/03/2022 FINAL  Final     Radiology Studies: No results found.  Scheduled Meds:  (feeding supplement) PROSource Plus  30 mL Oral TID BM   Chlorhexidine Gluconate Cloth  6 each Topical Daily   metoprolol tartrate  12.5 mg Oral BID   pantoprazole  40 mg Oral Daily   sodium chloride flush  3 mL Intravenous Q12H   Continuous Infusions:     LOS: 13 days   Shelly Coss, MD [facility] P2/13/2024, 1:05 PM

## 2022-06-05 NOTE — Care Management Important Message (Signed)
Important Message  Patient Details Hospice Comfort Care. Name: Brandy Wheeler MRN: [id number] Date of Birth: 08-01-1931   Medicare Important Message Given:  No     Kerin Salen 06/05/2022, 10:31 AM

## 2022-06-06 ENCOUNTER — Other Ambulatory Visit (HOSPITAL_COMMUNITY): Payer: Self-pay

## 2022-06-06 DIAGNOSIS — R10817 Generalized abdominal tenderness: Secondary | ICD-10-CM | POA: Diagnosis not present

## 2022-06-06 DIAGNOSIS — A419 Sepsis, unspecified organism: Secondary | ICD-10-CM | POA: Diagnosis not present

## 2022-06-06 DIAGNOSIS — R1084 Generalized abdominal pain: Secondary | ICD-10-CM | POA: Diagnosis not present

## 2022-06-06 DIAGNOSIS — Z7401 Bed confinement status: Secondary | ICD-10-CM | POA: Diagnosis not present

## 2022-06-06 DIAGNOSIS — N390 Urinary tract infection, site not specified: Secondary | ICD-10-CM | POA: Diagnosis not present

## 2022-06-06 MED ORDER — HEPARIN SOD (PORK) LOCK FLUSH 100 UNIT/ML IV SOLN
500.0000 [IU] | INTRAVENOUS | Status: DC | PRN
  Filled 2022-06-06: qty 5

## 2022-06-06 MED ORDER — SENNA 8.6 MG PO TABS
1.0000 | ORAL_TABLET | Freq: Two times a day (BID) | ORAL | 0 refills | Status: DC
  Filled 2022-06-06: qty 120, 60d supply, fill #0

## 2022-06-06 MED ORDER — OXYCODONE-ACETAMINOPHEN 5-325 MG PO TABS
1.0000 | ORAL_TABLET | Freq: Four times a day (QID) | ORAL | 0 refills | Status: DC | PRN
  Filled 2022-06-06: qty 15, 4d supply, fill #0

## 2022-06-06 MED ORDER — POLYETHYLENE GLYCOL 3350 17 GM/SCOOP PO POWD
17.0000 g | Freq: Every day | ORAL | 0 refills | Status: DC
  Filled 2022-06-06: qty 238, 14d supply, fill #0

## 2022-06-06 MED ORDER — METOPROLOL TARTRATE 25 MG PO TABS
12.5000 mg | ORAL_TABLET | Freq: Two times a day (BID) | ORAL | 0 refills | Status: DC
  Filled 2022-06-06: qty 30, 30d supply, fill #0

## 2022-06-06 MED ORDER — PANTOPRAZOLE SODIUM 40 MG PO TBEC
40.0000 mg | DELAYED_RELEASE_TABLET | Freq: Every day | ORAL | 0 refills | Status: DC
  Filled 2022-06-06: qty 30, 30d supply, fill #0

## 2022-06-06 NOTE — Discharge Summary (Signed)
Physician Discharge Summary  Brandy Wheeler DOB: March 15, 1932 DOA: 05/23/2022  PCP: Pcp, No  Admit date: 05/23/2022 Discharge date: 06/06/2022  Admitted From: Home Disposition:  Home  Discharge Condition:Stable CODE STATUS:Comfort Care Diet recommendation: Regular   Brief/Interim Summary:  Patient is 89 year old female with history of bladder cancer status post robotic cystectomy, ileal conduit diversion, GERD, anemia who presented for the evaluation of altered mental status.  She was found to have sepsis secondary to UTI.  Started on antibiotics.  Hospital course remarkable for persistent altered mental status, severe hyperglycemia, right lower extremity pain with suspicion of malignancy versus osteomyelitis.   Underwent CT-guided bone biopsy of right anterior bone lesion by IR on 2/6 which showed evidence of metastasis.  Goals of care discussed, now currently she is on comfort care.she did not qualify for residential hospice, plan for discharge home with hospice .  TOC, hospice team following.  Plan for discharge home with hospice today  Following problems were addressed during the hospitalization:  Acute metabolic encephalopathy: Unclear etiology.  Initially thought to be from hypercalcemia  which has resolved.  CT head did not show any acute intracranial abnormalities.  MRI did not show any acute findings, showed chronic microvascular changes, chronic infarcts.  Minimize narcotics, sedatives.   Severe sepsis/UTI: Present on admission.  Procalcitonin negative.  Urine culture showed MRSA and Streptococcus mitis/oralis.  Blood cultures have not shown any growth.  ID was following. Completed abx course On comfort care.   Right lower extremity lesion/right ankle/leg pain: CT tibia/fibula suggested possible osteomyelitis.  Case was discussed with on-call for orthopedics, Dr. Grandville Silos who recommended MRI versus CT-guided biopsy.  MRI tibia/fibula showed widespread osseous abnormalities  throughout both lower legs with suspicion of metastatic disease / multiple myeloma.  TTE did not show any vegetation.  Underwent IR guided bone biopsy/culture.  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  Vitamin D level normal.  PT is low.  Given Zometa, calcitonin.   Normocytic anemia: Hemoglobin dropped to the range of 7.  Low iron as per iron studies. S/p a unit of blood transfusion.  Last  Hemoglobin in the range of 9.  Now on comfort care   Left tibia/fibula fracture: Follows with orthopedic surgery, has casted left leg, non weightbearing on left side.  On comfort care   Oligometastatic bladder cancer: Follows with oncology and urology.  Recent history of robotic cystectomy with node dissection and conduit diversion.  No on comfort care   Severe protein calorie malnutrition: Nutrition was  following.  Continues to have poor oral intake.   Goals of care: Elderly patient with multiple comorbidities, history of bladder cancer now with possible another malignancy.  Overall prognosis is poor.  Goals of care discussed with patient and  daughter.  Care transitioned to comfort.  Plan for discharge to home with hospice      Discharge Diagnoses:  Principal Problem:   Sepsis due to urinary tract infection ([place]) Active Problems:   Bladder cancer ([facility])   Hypercalcemia   Hyperkalemia   Normocytic anemia   Acute metabolic encephalopathy   Protein-calorie malnutrition, severe   Hypokalemia   Acute cystitis with hematuria    Discharge Instructions  Discharge Instructions     Diet general   Complete by: As directed    Discharge instructions   Complete by: As directed    1)Please take prescribed medications as instructed  2)Follow up with hospice at home   Increase activity slowly   Complete by: As directed       Allergies as of 06/06/2022   No Known  Allergies      Medication List     STOP taking these medications    Klor-Con 10 10 MEQ tablet Generic drug: potassium chloride   omeprazole 20 MG capsule Commonly known as: PRILOSEC   potassium chloride 10 MEQ tablet Commonly known as: KLOR-CON M   vitamin B-12 500 MCG tablet Commonly known as: CYANOCOBALAMIN   VITAMIN D3 PO       TAKE these medications    BONE DENSITY BUILDER PO Take 1 tablet by mouth See admin instructions. [employer] Bone Builder Forte tablets- Take 1 tablet by mouth in the morning with breakfast   metoprolol tartrate 25 MG tablet Commonly known as: LOPRESSOR Take 0.5 tablets (12.5 mg total) by mouth 2 (two) times daily.   oxyCODONE-acetaminophen 5-325 MG tablet Commonly known as: Percocet Take 1 tablet by mouth every 6 (six) hours as needed for severe pain or moderate pain (post-operatively).   pantoprazole 40 MG tablet Commonly known as: PROTONIX Take 1 tablet (40 mg total) by mouth daily. Start taking on: June 07, 2022   polyethylene glycol 17 g packet Commonly known as: MiraLax Take 17 g by mouth daily.   senna 8.6 MG Tabs tablet Commonly known as: SENOKOT Take 1 tablet (8.6 mg total) by mouth 2 (two) times daily.        No Known Allergies  Consultations: Palliative care, oncology, IR   Procedures/Studies: CT BONE TROCAR/NEEDLE BIOPSY SUPERFICIAL  Result Date: 05/29/2022 INDICATION: 89 year-old with bone lesions including a permeative lesion involving the anterior right tibia. EXAM: CT-GUIDED CORE BIOPSY OF RIGHT TIBIAL LESION MEDICATIONS: Fentanyl 50 mcg ANESTHESIA/SEDATION: The patient's level of consciousness and vital signs were monitored continuously by radiology nursing throughout the procedure under my direct supervision. FLUOROSCOPY TIME:  None COMPLICATIONS: None immediate. PROCEDURE: Informed consent was obtained for CT-guided biopsy. A timeout was performed prior to the initiation of the procedure. Patient was  placed supine on the CT scanner. Images through the right lower leg were obtained. The permeative lesion involving the anterior right tibia was identified and targeted. Overlying skin was prepped with chlorhexidine and sterile field was created. Skin was anesthetized with 1% lidocaine. Small incision was made. Initially, 17 gauge coaxial needle was directed into the lesion and attempted to obtain a core biopsy with an 18 gauge BioPince needle. This needle was difficult to remove from the bone and only a small amount of soft tissue was removed. Therefore, an 11 gauge bone needle was directed towards the lesion and 14 gauge bone biopsy needle was advanced through the 11 gauge needle into the bone. Two core biopsies were obtained of the bone and placed in saline. Coaxial needle was removed. Follow-up imaging was obtained. Bandage placed over the puncture site. FINDINGS: Again noted is a permeative lesion with destruction of the anterior cortex in the mid tibia. There is surrounding abnormal soft tissue. Small amount of soft tissue and 2 small bone core biopsies were obtained. IMPRESSION: CT-guided core biopsy of a lesion in the anterior right tibia. Electronically Signed   By: Markus Daft M.D.   On: 05/29/2022 16:10   MR BRAIN WO CONTRAST  Result Date: 05/28/2022 CLINICAL DATA:  Mental status change, cause. EXAM: MRI HEAD WITHOUT CONTRAST TECHNIQUE: Multiplanar, multiecho pulse sequences of the brain and surrounding structures were obtained without intravenous  contrast. COMPARISON:  None Available. FINDINGS: Brain: No acute infarction, hemorrhage, hydrocephalus, extra-axial collection or mass lesion. 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 Scattered foci of T2 hyperintensity are seen within the white matter of the cerebral hemispheres, nonspecific, most likely related to chronic small vessel ischemia. Vascular: Normal flow voids. Skull and upper cervical spine: Normal  marrow signal. Sinuses/Orbits: Decrease volume and opacification of the right maxillary sinus with mild lowering of the right orbital floor, suggesting silent sinus syndrome. Mild right mastoid effusion. Other: None. IMPRESSION: 1. No acute intracranial abnormality. 2. Mild chronic microvascular ischemic changes of the white matter. 3. 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 Electronically Signed   By: Pedro Earls M.D.   On: 05/28/2022 15:28   MR TIBIA FIBULA RIGHT W WO CONTRAST  Result Date: 05/28/2022 CLINICAL DATA:  Bone mass or bone pain, leg, aggressive features on x-ray. Primary tip tibial lesion on CT. History of bladder cancer. EXAM: MRI OF LOWER RIGHT EXTREMITY WITHOUT AND WITH CONTRAST TECHNIQUE: Multiplanar, multisequence MR imaging of the right lower leg was performed both before and after administration of intravenous contrast. CONTRAST:  24m GADAVIST GADOBUTROL 1 MMOL/ML IV SOLN COMPARISON:  Radiographs 05/24/2022.  CT 05/25/2022. FINDINGS: Bones/Joint/Cartilage Both lower legs are included on the coronal images. There are extensive widespread osseous abnormalities and both lower legs. The lesion of concern in the mid right tibial diaphysis measures approximately 4.9 cm in length and is associated with permeative destruction of the cortex anteriorly, periosteal reaction and an enhancing soft tissue mass measuring approximately 2.8 x 2.5 cm transverse on image 39/20. Similar appearing permeative lesion involving the distal right fibular diaphysis extends up to 4.1 cm in length. There are numerous additional smaller lesions throughout the right tibia as well as the visualized right tarsal bones. In the left lower leg, the marrow abnormalities are more diffuse, with near complete involvement of the tibia and a large distal fibular lesion. No large joint effusions or definite pathologic fractures are identified. Ligaments No significant  ligamentous abnormalities on large field-of-view imaging. Muscles and Tendons Multifocal periosteal reaction associated with the osseous lesions described above. No intramuscular masses are identified in the right lower leg. Incidental imaging of the left lower leg demonstrates extensive edema throughout the musculature. Soft tissues The soft tissue gas around the right knee seen on the recent CT is not clearly visualized. No focal soft tissue masses or fluid collections are identified aside from those associated with the permeative bone lesions. As above, there is periosteal edema and enhancement as well a small amount of subcutaneous edema in the mid right lower leg. In the visualized left lower leg, there is more diffuse soft tissue edema as well as apparent soft tissue emphysema medially in the proximal lower leg. IMPRESSION: 1. Widespread osseous abnormalities throughout both lower legs. This pattern can be seen with metastatic disease and multiple myeloma. As suggested on CT, given previous soft tissue findings and admission for sepsis, findings could reflect multifocal osteomyelitis. Correlate clinically and consider further systemic assessment with whole-body bone scan or PET-CT. 2. No focal soft tissue masses or fluid collections are identified in the right lower leg aside from those associated with the permeative bone lesions. 3. More extensive soft tissue abnormalities throughout the visualized left lower leg with soft tissue emphysema and edema. Electronically Signed   By: WRichardean SaleM.D.   On: 05/28/2022  08:22   DG Abd Portable 1V  Result Date: 05/27/2022 CLINICAL DATA:  Abdominal tenderness, history of ileal conduit urinary diversion EXAM: PORTABLE ABDOMEN - 1 VIEW COMPARISON:  None Available. FINDINGS: Nonobstructive pattern of bowel gas. Ureteral stent catheters in expected position for ileal conduit urinary diversion. No free air in the abdomen on supine radiographs. IMPRESSION:  Nonobstructive pattern of bowel gas. Ureteral stent catheters in expected position for ileal conduit urinary diversion. Electronically Signed   By: Delanna Ahmadi M.D.   On: 05/27/2022 16:18   ECHOCARDIOGRAM COMPLETE  Result Date: 05/27/2022    ECHOCARDIOGRAM REPORT   Patient Name:   Davon Salman Date of Exam: 05/27/2022 Medical Rec #:  [id number]   Height:       63.0 in Accession #:    [id number]  Weight:       117.7 lb Date of Birth:  [birth year]   BSA:          1.544 m Patient Age:    8 years    BP:           178/57 mmHg Patient Gender: F           HR:           93 bpm. Exam Location:  Inpatient Procedure: 2D Echo Indications:    bacteremia  History:        Patient has no prior history of Echocardiogram examinations.  Sonographer:    Harvie Junior Referring Phys: [id number] [place]  1. Left ventricular ejection fraction, by estimation, is 60 to 65%. The left ventricle has normal function. The left ventricle has no regional wall motion abnormalities. Indeterminate diastolic filling due to E-A fusion.  2. Right ventricular systolic function is normal. The right ventricular size is normal.  3. No evidence of mitral valve regurgitation.  4. There is mild calcification of the aortic valve. Aortic valve regurgitation is not visualized.  5. The inferior vena cava is normal in size with greater than 50% respiratory variability, suggesting right atrial pressure of 3 mmHg. Comparison(s): No prior Echocardiogram. Conclusion(s)/Recommendation(s): Normal biventricular function without evidence of hemodynamically significant valvular heart disease. Windows challenging. No frank signs of valvular vegetations. If high concern, recommend a TEE. FINDINGS  Left Ventricle: Left ventricular ejection fraction, by estimation, is 60 to 65%. The left ventricle has normal function. The left ventricle has no regional wall motion abnormalities. The left ventricular internal cavity size was normal in size. There is  no left  ventricular hypertrophy. Indeterminate diastolic filling due to E-A fusion. Right Ventricle: The right ventricular size is normal. Right ventricular systolic function is normal. Left Atrium: Left atrial size was normal in size. Right Atrium: Right atrial size was normal in size. Pericardium: There is no evidence of pericardial effusion. Mitral Valve: No evidence of mitral valve regurgitation. Tricuspid Valve: Tricuspid valve regurgitation is not demonstrated. Aortic Valve: There is mild calcification of the aortic valve. Aortic valve regurgitation is not visualized. Aortic valve mean gradient measures 2.5 mmHg. Aortic valve peak gradient measures 4.7 mmHg. Aortic valve area, by VTI measures 3.22 cm. Pulmonic Valve: Pulmonic valve regurgitation is not visualized. Aorta: The aortic root and ascending aorta are structurally normal, with no evidence of dilitation. Venous: The inferior vena cava is normal in size with greater than 50% respiratory variability, suggesting right atrial pressure of 3 mmHg. IAS/Shunts: No atrial level shunt detected by color flow Doppler.  LEFT VENTRICLE PLAX 2D LVIDd:  3.60 cm     Diastology LVIDs:         2.30 cm     LV e' medial:    6.96 cm/s LV PW:         0.90 cm     LV E/e' medial:  12.6 LV IVS:        0.90 cm     LV e' lateral:   12.10 cm/s LVOT diam:     1.90 cm     LV E/e' lateral: 7.2 LV SV:         54 LV SV Index:   35 LVOT Area:     2.84 cm  LV Volumes (MOD) LV vol d, MOD A2C: 59.4 ml LV vol d, MOD A4C: 77.8 ml LV vol s, MOD A2C: 19.3 ml LV vol s, MOD A4C: 31.1 ml LV SV MOD A2C:     40.1 ml LV SV MOD A4C:     77.8 ml LV SV MOD BP:      43.3 ml RIGHT VENTRICLE RV Basal diam:  2.40 cm RV Mid diam:    1.90 cm RV S prime:     17.40 cm/s TAPSE (M-mode): 1.6 cm LEFT ATRIUM             Index        RIGHT ATRIUM          Index LA diam:        2.90 cm 1.88 cm/m   RA Area:     7.82 cm LA Vol (A2C):   32.2 ml 20.86 ml/m  RA Volume:   14.40 ml 9.33 ml/m LA Vol (A4C):   21.3 ml  13.80 ml/m LA Biplane Vol: 26.6 ml 17.23 ml/m  AORTIC VALVE                    PULMONIC VALVE AV Area (Vmax):    2.71 cm     PV Vmax:       1.17 m/s AV Area (Vmean):   2.39 cm     PV Peak grad:  5.5 mmHg AV Area (VTI):     3.22 cm AV Vmax:           108.65 cm/s AV Vmean:          74.500 cm/s AV VTI:            0.168 m AV Peak Grad:      4.7 mmHg AV Mean Grad:      2.5 mmHg LVOT Vmax:         104.00 cm/s LVOT Vmean:        62.700 cm/s LVOT VTI:          0.191 m LVOT/AV VTI ratio: 1.14  AORTA Ao Root diam: 3.10 cm Ao Asc diam:  3.60 cm MITRAL VALVE MV Area (PHT): 5.13 cm     SHUNTS MV Decel Time: 148 msec     Systemic VTI:  0.19 m MV E velocity: 87.50 cm/s   Systemic Diam: 1.90 cm MV A velocity: 123.00 cm/s MV E/A ratio:  0.71 Mary Scientist, physiological signed by Phineas Inches Signature Date/Time: 05/27/2022/12:29:31 PM    Final    CT TIBIA FIBULA RIGHT WO CONTRAST  Result Date: 05/26/2022 CLINICAL DATA:  Permeative lesion in the mid tibia. History of bladder cancer. EXAM: CT OF THE LOWER RIGHT EXTREMITY WITHOUT CONTRAST TECHNIQUE: Multidetector CT imaging of the right lower extremity was performed according to the standard protocol. RADIATION DOSE  REDUCTION: This exam was performed according to the departmental dose-optimization program which includes automated exposure control, adjustment of the mA and/or kV according to patient size and/or use of iterative reconstruction technique. COMPARISON:  Radiographs 05/24/2022 FINDINGS: Bones/Joint/Cartilage CT confirms a poorly defined permeative lesion in the mid to distal diaphysis of the tibia anteriorly with a somewhat moth-eaten appearance of the cortex, substantial irregular periosteal reaction, and endosteal indistinctness in the region of concern. This extends over a an 11.9 cm excursion and primarily involves the anterior, medial, and lateral cortex of the tibial shaft. Hypodensity in the immediately adjacent soft tissues likely from edema, but without a large  soft tissue mass component. In addition, there is circumferential periosteal reaction in the distal fibular metadiaphysis. The patient's original fracture in this vicinity was from 2015 and this degree of periosteal reaction and mildly moth-eaten cortex would not be a typical postoperative finding from 2015. This is probably a similar process to that involving the tibia and is thought to be acute. Bony demineralization underlying the lateral talar process as on image 62 series 7 and image 283 series 6. Ligaments Suboptimally assessed by CT. Muscles and Tendons Gas tracks along fascia planes adjacent to some of the muscle groups particularly in the knee region. Soft tissues There is substantial gas tracking along fascia planes in the right knee (and in the left knee but only seen in a very limited manner on the left). On prior CT abdomen the patient has subcutaneous emphysema in the chest, anterior and lateral abdominal wall, along the labia, and in the anterior thighs, and presumably the gas visible on today's exam represents that process tracking down. IMPRESSION: 1. Permeative lesion in the mid to distal diaphysis of the right tibia with substantial periosteal reaction and endosteal indistinctness. Similar periosteal reaction and milder permeative moth-eaten lesion in the distal fibular metadiaphysis. Localized edema/fluid along the regions of maximum involvement but no obvious soft tissue mass. 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 2. Bony demineralization underlying the lateral talar process. This could also be an indicator of early osteomyelitis. 3. Gas tracking along fascia planes and subcutaneous tissues in the knee regions bilaterally, likely representing the inferior extent of the subcutaneous emphysema of the chest, abdomen, and thighs shown on CT abdomen imaging.  Electronically Signed   By: Van Clines M.D.   On: 05/26/2022 13:01   DG CHEST PORT 1 VIEW  Result Date: 05/24/2022 CLINICAL DATA:  Tachypnea, history of bladder carcinoma EXAM: PORTABLE CHEST 1 VIEW COMPARISON:  05/23/2022 FINDINGS: Cardiac shadow is stable. Right chest wall port is noted in satisfactory position. Aortic calcifications are seen. Lungs are well aerated bilaterally. No focal infiltrate or sizable effusion is noted. Persistent subcutaneous emphysema is noted related to prior laparoscopy IMPRESSION: No acute abnormality noted. Electronically Signed   By: Inez Catalina M.D.   On: 05/24/2022 17:13   DG Tibia/Fibula Right  Result Date: 05/24/2022 CLINICAL DATA:  Fall.  Right leg pain.  Right ankle pain. EXAM: RIGHT TIBIA AND FIBULA - 2 VIEW; RIGHT ANKLE - COMPLETE 3+ VIEW COMPARISON:  Right ankle radiographs 11/23/2013, 10/27/2013, and 10/10/2013; CT abdomen and pelvis 05/23/2022 FINDINGS: Right ankle: Interval healing of the previously seen oblique distal fibular metadiaphyseal fracture. There is increased periosteal thickening of the anterior aspect of the distal fibular diaphysis, presumably from healing of that fracture. Minimal persistent fracture line lucency is seen in  the region of the prior 2015 acute fracture, presumably now the patient's chronic baseline. The ankle mortise is symmetric and intact. Small plantar calcaneal heel spur. No acute fracture is seen. Moderate talonavicular joint space narrowing. Right tibia and fibula: No acute fracture is seen within the more proximal right tibia or fibula. There is mild cortical lucency/thinning of the anterior tibial diaphyseal cortex at mid height. Mild-to-moderate medial compartment of the knee joint space narrowing. There is moderate scattered subcutaneous air about the posteromedial greater than the lateral aspect of the partially visualized distal right thigh. Note is made of extensive subcutaneous anterior abdominopelvic wall  emphysema seen extending along the anterior pelvis and bilateral proximal thighs on 05/23/2022 CT, presumably extending down the right thigh to the right knee on the current radiographs. IMPRESSION: Compared to 11/23/2013 1. Interval healing of previously seen distal fibular metadiaphyseal fracture. Minimal persistent fracture line lucency is seen in the region of the prior 2015 acute fracture, presumably now the patient's chronic baseline. 2. There is lucency with possible mild "hair on end" periosteal reaction within the anterolateral aspect of the right tibial diaphysis that is nonspecific. This may be secondary to chronic stress related changes or infectious/inflammatory process. This may be subacute to chronic posttraumatic change. It is difficult to exclude an infectious or malignant etiology. CT or MRI of the right tibia may be helpful for further evaluation. Electronically Signed   By: Yvonne Kendall M.D.   On: 05/24/2022 13:47   DG Ankle Complete Right  Result Date: 05/24/2022 CLINICAL DATA:  Fall.  Right leg pain.  Right ankle pain. EXAM: RIGHT TIBIA AND FIBULA - 2 VIEW; RIGHT ANKLE - COMPLETE 3+ VIEW COMPARISON:  Right ankle radiographs 11/23/2013, 10/27/2013, and 10/10/2013; CT abdomen and pelvis 05/23/2022 FINDINGS: Right ankle: Interval healing of the previously seen oblique distal fibular metadiaphyseal fracture. There is increased periosteal thickening of the anterior aspect of the distal fibular diaphysis, presumably from healing of that fracture. Minimal persistent fracture line lucency is seen in the region of the prior 2015 acute fracture, presumably now the patient's chronic baseline. The ankle mortise is symmetric and intact. Small plantar calcaneal heel spur. No acute fracture is seen. Moderate talonavicular joint space narrowing. Right tibia and fibula: No acute fracture is seen within the more proximal right tibia or fibula. There is mild cortical lucency/thinning of the anterior tibial  diaphyseal cortex at mid height. Mild-to-moderate medial compartment of the knee joint space narrowing. There is moderate scattered subcutaneous air about the posteromedial greater than the lateral aspect of the partially visualized distal right thigh. Note is made of extensive subcutaneous anterior abdominopelvic wall emphysema seen extending along the anterior pelvis and bilateral proximal thighs on 05/23/2022 CT, presumably extending down the right thigh to the right knee on the current radiographs. IMPRESSION: Compared to 11/23/2013 1. Interval healing of previously seen distal fibular metadiaphyseal fracture. Minimal persistent fracture line lucency is seen in the region of the prior 2015 acute fracture, presumably now the patient's chronic baseline. 2. There is lucency with possible mild "hair on end" periosteal reaction within the anterolateral aspect of the right tibial diaphysis that is nonspecific. This may be secondary to chronic stress related changes or infectious/inflammatory process. This may be subacute to chronic posttraumatic change. It is difficult to exclude an infectious or malignant etiology. CT or MRI of the right tibia may be helpful for further evaluation. Electronically Signed   By: Yvonne Kendall M.D.   On: 05/24/2022 13:47   CT ABDOMEN  PELVIS W CONTRAST  Result Date: 05/23/2022 CLINICAL DATA:  Postoperative abdominal pain. Radical cystectomy and ileal conduit performed on 05/09/2022. Urothelial carcinoma of the bladder. EXAM: CT ABDOMEN AND PELVIS WITH CONTRAST TECHNIQUE: Multidetector CT imaging of the abdomen and pelvis was performed using the standard protocol following bolus administration of intravenous contrast. RADIATION DOSE REDUCTION: This exam was performed according to the departmental dose-optimization program which includes automated exposure control, adjustment of the mA and/or kV according to patient size and/or use of iterative reconstruction technique. CONTRAST:  3m  OMNIPAQUE IOHEXOL 300 MG/ML  SOLN COMPARISON:  CT abdomen and pelvis 04/29/2022. CT chest abdomen and pelvis 03/01/2022. FINDINGS: Lower chest: Chest wall emphysema present compatible with recent surgery. There is a new 5 mm left lower lobe nodule. Hepatobiliary: No focal liver abnormality is seen. No gallstones, gallbladder wall thickening, or biliary dilatation. Pancreas: Unremarkable. No pancreatic ductal dilatation or surrounding inflammatory changes. Spleen: Normal in size without focal abnormality. Adrenals/Urinary Tract: Patient is status post cystectomy with new right-sided abdominal ileal conduit/urinary diversion. There is mild bilateral hydronephrosis without obstructing calculus. Bilateral ureteral stents are present. There is no perinephric fluid collection or stranding. There is normal renal enhancement bilaterally. The adrenal glands are within normal limits. Stomach/Bowel: Appendix is not seen. No evidence of bowel wall thickening, distention, or inflammatory changes. There is a moderate-sized hiatal hernia. Stomach is nondilated. There is sigmoid colon diverticulosis. Vascular/Lymphatic: Aortic atherosclerosis. No enlarged abdominal or pelvic lymph nodes. Reproductive: Status post hysterectomy. No adnexal masses. Other: There is extensive subcutaneous abdominal and pelvic wall emphysema compatible with recent surgery. There is no abdominal wall hernia. No focal fluid collections are identified. Musculoskeletal: Degenerative changes affect the spine. IMPRESSION: 1. Patient is status post cystectomy with right-sided ileal conduit/urinary diversion. There is mild bilateral hydronephrosis without obstructing calculus. Bilateral ureteral stents are in place. 2. Extensive subcutaneous emphysema compatible with recent surgery. 3. Moderate-sized hiatal hernia. 4. Aortic atherosclerosis. 5. There is a new 5 mm left lower lobe nodule. Metastatic disease not excluded. Aortic Atherosclerosis (ICD10-I70.0).  Electronically Signed   By: ARonney AstersM.D.   On: 05/23/2022 21:20   DG Chest 2 View  Result Date: 05/23/2022 CLINICAL DATA:  Suspected sepsis. Weakness. Patient had robotic assisted laparoscopic cystectomy with ileal conduit and pelvic lymph node dissection on 05/09/2022. EXAM: CHEST - 2 VIEW COMPARISON:  03/01/2022 FINDINGS: Patient has RIGHT-sided PowerPort, tip overlying the superior vena cava. Heart size is normal. The lungs are free of focal consolidations and pleural effusions. There is no pulmonary edema. There is subcutaneous emphysema in the soft tissues of the UPPER abdomen, likely related to recent laparoscopic procedure. IMPRESSION: 1. No evidence for acute cardiopulmonary abnormality. 2. Subcutaneous emphysema in the UPPER abdomen, likely related to recent laparoscopic procedure. Consider CT of the abdomen and pelvis as needed. Electronically Signed   By: ENolon NationsM.D.   On: 05/23/2022 16:13      Subjective: Patient seen and examined at bedside today.  Hemodynamically stable.  Complains of some constipation.  Abdomen soft and nondistended.  Overall looks comfortable.  Hemodynamically stable for discharge.  I called and discussed with daughter about discharge planning  Discharge Exam: Vitals:   06/05/22 0535 06/05/22 2206  BP: (!) 130/52 (!) 130/48  Pulse: (!) 107 (!) 105  Resp: 16   Temp: 97.8 F (36.6 C) 98.6 F (37 C)  SpO2: 96% 95%   Vitals:   06/04/22 0513 06/04/22 2021 06/05/22 0535 06/05/22 2206  BP: (!) 143/51 (!) 120/51 (Marland Kitchen  130/52 (!) 130/48  Pulse: (!) 107 (!) 110 (!) 107 (!) 105  Resp: 16 15 16   $ Temp: 98 F (36.7 C) 99 F (37.2 C) 97.8 F (36.6 C) 98.6 F (37 C)  TempSrc: Oral Oral Oral Oral  SpO2: 97% 97% 96% 95%  Weight: 57.9 kg  56.4 kg   Height:        General: Pt is alert, awake, not in acute distress, deconditioned, weak Cardiovascular: RRR, S1/S2 +, no rubs, no gallops Respiratory: CTA bilaterally, no wheezing, no rhonchi Abdominal:  Soft, NT, ND, bowel sounds +.ileal conduit Extremities: no edema, no cyanosis    The results of significant diagnostics from this hospitalization (including imaging, microbiology, ancillary and laboratory) are listed below for reference.     Microbiology: Recent Results (from the past 240 hour(s))  Aerobic/Anaerobic Culture w Gram Stain (surgical/deep wound)     Status: None   Collection Time: 05/29/22  3:10 PM   Specimen: Bone; Tissue  Result Value Ref Range Status   Specimen Description   Final    BONE BIOPSY TIBIA RIGHT Performed at [facility], [address] [place], [place] [address]    Special Requests   Final    NONE Performed at [facility], [place] [address] [place], [place] [address]    Gram Stain NO WBC SEEN NO ORGANISMS SEEN   Final   Culture   Final    No growth aerobically or anaerobically. Performed at [facility], [place] [address] [place], [place] [address]    Report Status 06/03/2022 FINAL  Final     Labs: BNP (last 3 results) No results for input(s): "BNP" in the last 8760 hours. Basic Metabolic Panel: Recent Labs  Lab 06/01/22 0942 06/02/22 0623  NA 132* 134*  K 3.0* 3.0*  CL 109 106  CO2 19* 19*  GLUCOSE 130* 106*  BUN 13 14  CREATININE 0.53 0.60  CALCIUM 6.7* 6.7*   Liver Function Tests: No results for input(s): "AST", "ALT", "ALKPHOS", "BILITOT", "PROT", "ALBUMIN" in the last 168 hours. No results for input(s): "LIPASE", "AMYLASE" in the last 168 hours. No results for input(s): "AMMONIA" in the last 168 hours. CBC: Recent Labs  Lab 05/30/22 2215 05/31/22 0602 06/01/22 0942  WBC  --  10.7* 11.8*  HGB 8.4* 8.4* 9.0*  HCT 26.6* 26.6* 28.9*  MCV  --  84.4 84.0  PLT  --  215 231   Cardiac Enzymes: No results for input(s): "CKTOTAL", "CKMB", "CKMBINDEX", "TROPONINI" in the last 168 hours. BNP: Invalid input(s): "POCBNP" CBG: Recent Labs  Lab 06/01/22 0737 06/01/22 1124 06/03/22 0745  06/03/22 1158 06/03/22 1619  GLUCAP 137* 128* 120* 122* 114*   D-Dimer No results for input(s): "DDIMER" in the last 72 hours. Hgb A1c No results for input(s): "HGBA1C" in the last 72 hours. Lipid Profile No results for input(s): "CHOL", "HDL", "LDLCALC", "TRIG", "CHOLHDL", "LDLDIRECT" in the last 72 hours. Thyroid function studies No results for input(s): "TSH", "T4TOTAL", "T3FREE", "THYROIDAB" in the last 72 hours.  Invalid input(s): "FREET3" Anemia work up No results for input(s): "VITAMINB12", "FOLATE", "FERRITIN", "TIBC", "IRON", "RETICCTPCT" in the last 72 hours. Urinalysis    Component Value Date/Time   COLORURINE YELLOW 05/23/2022 1550   APPEARANCEUR CLOUDY (A) 05/23/2022 1550   LABSPEC 1.013 05/23/2022 1550   PHURINE 7.0 05/23/2022 1550   GLUCOSEU NEGATIVE 05/23/2022 1550   HGBUR MODERATE (A) 05/23/2022 1550   BILIRUBINUR NEGATIVE 05/23/2022 [place] 05/23/2022  1550   PROTEINUR 100 (A) 05/23/2022 1550   NITRITE POSITIVE (A) 05/23/2022 1550   LEUKOCYTESUR LARGE (A) 05/23/2022 1550   Sepsis Labs Recent Labs  Lab 05/31/22 0602 06/01/22 0942  WBC 10.7* 11.8*   Microbiology Recent Results (from the past 240 hour(s))  Aerobic/Anaerobic Culture w Gram Stain (surgical/deep wound)     Status: None   Collection Time: 05/29/22  3:10 PM   Specimen: Bone; Tissue  Result Value Ref Range Status   Specimen Description   Final    BONE BIOPSY TIBIA RIGHT Performed at [facility], [place] [address] [place], [place] [address]    Special Requests   Final    NONE Performed at [facility], [place] [address] [place], [place] [address]    Gram Stain NO WBC SEEN NO ORGANISMS SEEN   Final   Culture   Final    No growth aerobically or anaerobically. Performed at [facility], [place] [address] [place],  [address]    Report Status 06/03/2022 FINAL  Final    Please note: You were cared for by a hospitalist during your  hospital stay. Once you are discharged, your primary care physician will handle any further medical issues. Please note that NO REFILLS for any discharge medications will be authorized once you are discharged, as it is imperative that you return to your primary care physician (or establish a relationship with a primary care physician if you do not have one) for your post hospital discharge needs so that they can reassess your need for medications and monitor your lab values.    Time coordinating discharge: 40 minutes  SIGNED:   Shelly Coss, MD  [facility] 06/06/2022, 10:00 AM Pager [phone]  If 7PM-7AM, please contact night-coverage [email or web address] Physician Discharge Summary  Brandy Wheeler Wheeler DOB: 1931-11-28 DOA: 05/23/2022  PCP: Merryl Hacker, No  Admit date: 05/23/2022 Discharge date: 06/06/2022  Admitted From: Home Disposition:  Home  Discharge Condition:Stable CODE STATUS:FULL, DNR, Comfort Care Diet recommendation: Heart Healthy / Carb Modified / Regular / Dysphagia   Brief/Interim Summary:   Following problems were addressed during the hospitalization:   Discharge Diagnoses:  Principal Problem:   Sepsis due to urinary tract infection ([place]) Active Problems:   Bladder cancer ([place])   Hypercalcemia   Hyperkalemia   Normocytic anemia   Acute metabolic encephalopathy   Protein-calorie malnutrition, severe   Hypokalemia   Acute cystitis with hematuria    Discharge Instructions  Discharge Instructions     Diet general   Complete by: As directed    Discharge instructions   Complete by: As directed    1)Please take prescribed medications as instructed 2)Follow up with hospice at home   Increase activity slowly   Complete by: As directed       Allergies as of 06/06/2022   No Known Allergies      Medication List     STOP taking these medications    Klor-Con 10 10 MEQ tablet Generic drug: potassium chloride   omeprazole 20  MG capsule Commonly known as: PRILOSEC   potassium chloride 10 MEQ tablet Commonly known as: KLOR-CON M   vitamin B-12 500 MCG tablet Commonly known as: CYANOCOBALAMIN   VITAMIN D3 PO       TAKE these medications    BONE DENSITY BUILDER PO Take 1 tablet by mouth See admin instructions. [employer] Bone Builder Forte tablets- Take 1 tablet by mouth in the morning with breakfast  metoprolol tartrate 25 MG tablet Commonly known as: LOPRESSOR Take 0.5 tablets (12.5 mg total) by mouth 2 (two) times daily.   oxyCODONE-acetaminophen 5-325 MG tablet Commonly known as: Percocet Take 1 tablet by mouth every 6 (six) hours as needed for severe pain or moderate pain (post-operatively).   pantoprazole 40 MG tablet Commonly known as: PROTONIX Take 1 tablet (40 mg total) by mouth daily. Start taking on: June 07, 2022   polyethylene glycol 17 g packet Commonly known as: MiraLax Take 17 g by mouth daily.   senna 8.6 MG Tabs tablet Commonly known as: SENOKOT Take 1 tablet (8.6 mg total) by mouth 2 (two) times daily.        No Known Allergies  Consultations:    Procedures/Studies: CT BONE TROCAR/NEEDLE BIOPSY SUPERFICIAL  Result Date: 05/29/2022 INDICATION: 89 year-old with bone lesions including a permeative lesion involving the anterior right tibia. EXAM: CT-GUIDED CORE BIOPSY OF RIGHT TIBIAL LESION MEDICATIONS: Fentanyl 50 mcg ANESTHESIA/SEDATION: The patient's level of consciousness and vital signs were monitored continuously by radiology nursing throughout the procedure under my direct supervision. FLUOROSCOPY TIME:  None COMPLICATIONS: None immediate. PROCEDURE: Informed consent was obtained for CT-guided biopsy. A timeout was performed prior to the initiation of the procedure. Patient was placed supine on the CT scanner. Images through the right lower leg were obtained. The permeative lesion involving the anterior right tibia was identified and targeted. Overlying skin was  prepped with chlorhexidine and sterile field was created. Skin was anesthetized with 1% lidocaine. Small incision was made. Initially, 17 gauge coaxial needle was directed into the lesion and attempted to obtain a core biopsy with an 18 gauge BioPince needle. This needle was difficult to remove from the bone and only a small amount of soft tissue was removed. Therefore, an 11 gauge bone needle was directed towards the lesion and 14 gauge bone biopsy needle was advanced through the 11 gauge needle into the bone. Two core biopsies were obtained of the bone and placed in saline. Coaxial needle was removed. Follow-up imaging was obtained. Bandage placed over the puncture site. FINDINGS: Again noted is a permeative lesion with destruction of the anterior cortex in the mid tibia. There is surrounding abnormal soft tissue. Small amount of soft tissue and 2 small bone core biopsies were obtained. IMPRESSION: CT-guided core biopsy of a lesion in the anterior right tibia. Electronically Signed   By: Markus Daft M.D.   On: 05/29/2022 16:10   MR BRAIN WO CONTRAST  Result Date: 05/28/2022 CLINICAL DATA:  Mental status change, cause. EXAM: MRI HEAD WITHOUT CONTRAST TECHNIQUE: Multiplanar, multiecho pulse sequences of the brain and surrounding structures were obtained without intravenous contrast. COMPARISON:  None Available. FINDINGS: Brain: No acute infarction, hemorrhage, hydrocephalus, extra-axial collection or mass lesion. 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 Scattered foci of T2 hyperintensity are seen within the white matter of the cerebral hemispheres, nonspecific, most likely related to chronic small vessel ischemia. Vascular: Normal flow voids. Skull and upper cervical spine: Normal marrow signal. Sinuses/Orbits: Decrease volume and opacification of the right maxillary sinus with mild lowering of the right orbital floor, suggesting silent sinus syndrome. Mild right  mastoid effusion. Other: None. IMPRESSION: 1. No acute intracranial abnormality. 2. Mild chronic microvascular ischemic changes of the white matter. 3. 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 Electronically Signed   By: Sandre Kitty.D.  On: 05/28/2022 15:28   MR TIBIA FIBULA RIGHT W WO CONTRAST  Result Date: 05/28/2022 CLINICAL DATA:  Bone mass or bone pain, leg, aggressive features on x-ray. Primary tip tibial lesion on CT. History of bladder cancer. EXAM: MRI OF LOWER RIGHT EXTREMITY WITHOUT AND WITH CONTRAST TECHNIQUE: Multiplanar, multisequence MR imaging of the right lower leg was performed both before and after administration of intravenous contrast. CONTRAST:  64m GADAVIST GADOBUTROL 1 MMOL/ML IV SOLN COMPARISON:  Radiographs 05/24/2022.  CT 05/25/2022. FINDINGS: Bones/Joint/Cartilage Both lower legs are included on the coronal images. There are extensive widespread osseous abnormalities and both lower legs. The lesion of concern in the mid right tibial diaphysis measures approximately 4.9 cm in length and is associated with permeative destruction of the cortex anteriorly, periosteal reaction and an enhancing soft tissue mass measuring approximately 2.8 x 2.5 cm transverse on image 39/20. Similar appearing permeative lesion involving the distal right fibular diaphysis extends up to 4.1 cm in length. There are numerous additional smaller lesions throughout the right tibia as well as the visualized right tarsal bones. In the left lower leg, the marrow abnormalities are more diffuse, with near complete involvement of the tibia and a large distal fibular lesion. No large joint effusions or definite pathologic fractures are identified. Ligaments No significant ligamentous abnormalities on large field-of-view imaging. Muscles and Tendons Multifocal periosteal reaction associated with the osseous lesions described above. No intramuscular masses  are identified in the right lower leg. Incidental imaging of the left lower leg demonstrates extensive edema throughout the musculature. Soft tissues The soft tissue gas around the right knee seen on the recent CT is not clearly visualized. No focal soft tissue masses or fluid collections are identified aside from those associated with the permeative bone lesions. As above, there is periosteal edema and enhancement as well a small amount of subcutaneous edema in the mid right lower leg. In the visualized left lower leg, there is more diffuse soft tissue edema as well as apparent soft tissue emphysema medially in the proximal lower leg. IMPRESSION: 1. Widespread osseous abnormalities throughout both lower legs. This pattern can be seen with metastatic disease and multiple myeloma. As suggested on CT, given previous soft tissue findings and admission for sepsis, findings could reflect multifocal osteomyelitis. Correlate clinically and consider further systemic assessment with whole-body bone scan or PET-CT. 2. No focal soft tissue masses or fluid collections are identified in the right lower leg aside from those associated with the permeative bone lesions. 3. More extensive soft tissue abnormalities throughout the visualized left lower leg with soft tissue emphysema and edema. Electronically Signed   By: WRichardean SaleM.D.   On: 05/28/2022 08:22   DG Abd Portable 1V  Result Date: 05/27/2022 CLINICAL DATA:  Abdominal tenderness, history of ileal conduit urinary diversion EXAM: PORTABLE ABDOMEN - 1 VIEW COMPARISON:  None Available. FINDINGS: Nonobstructive pattern of bowel gas. Ureteral stent catheters in expected position for ileal conduit urinary diversion. No free air in the abdomen on supine radiographs. IMPRESSION: Nonobstructive pattern of bowel gas. Ureteral stent catheters in expected position for ileal conduit urinary diversion. Electronically Signed   By: ADelanna AhmadiM.D.   On: 05/27/2022 16:18    ECHOCARDIOGRAM COMPLETE  Result Date: 05/27/2022    ECHOCARDIOGRAM REPORT   Patient Name:   Yanette Veldman Date of Exam: 05/27/2022 Medical Rec #:  [id number]  Height:       63.0 in Accession #:    [id number] Weight:  117.7 lb Date of Birth:  [birth year]   BSA:          1.544 m Patient Age:    55 years    BP:           178/57 mmHg Patient Gender: F           HR:           93 bpm. Exam Location:  Inpatient Procedure: 2D Echo Indications:    bacteremia  History:        Patient has no prior history of Echocardiogram examinations.  Sonographer:    Harvie Junior Referring Phys: [id number] [place]  1. Left ventricular ejection fraction, by estimation, is 60 to 65%. The left ventricle has normal function. The left ventricle has no regional wall motion abnormalities. Indeterminate diastolic filling due to E-A fusion.  2. Right ventricular systolic function is normal. The right ventricular size is normal.  3. No evidence of mitral valve regurgitation.  4. There is mild calcification of the aortic valve. Aortic valve regurgitation is not visualized.  5. The inferior vena cava is normal in size with greater than 50% respiratory variability, suggesting right atrial pressure of 3 mmHg. Comparison(s): No prior Echocardiogram. Conclusion(s)/Recommendation(s): Normal biventricular function without evidence of hemodynamically significant valvular heart disease. Windows challenging. No frank signs of valvular vegetations. If high concern, recommend a TEE. FINDINGS  Left Ventricle: Left ventricular ejection fraction, by estimation, is 60 to 65%. The left ventricle has normal function. The left ventricle has no regional wall motion abnormalities. The left ventricular internal cavity size was normal in size. There is  no left ventricular hypertrophy. Indeterminate diastolic filling due to E-A fusion. Right Ventricle: The right ventricular size is normal. Right ventricular systolic function is normal. Left Atrium:  Left atrial size was normal in size. Right Atrium: Right atrial size was normal in size. Pericardium: There is no evidence of pericardial effusion. Mitral Valve: No evidence of mitral valve regurgitation. Tricuspid Valve: Tricuspid valve regurgitation is not demonstrated. Aortic Valve: There is mild calcification of the aortic valve. Aortic valve regurgitation is not visualized. Aortic valve mean gradient measures 2.5 mmHg. Aortic valve peak gradient measures 4.7 mmHg. Aortic valve area, by VTI measures 3.22 cm. Pulmonic Valve: Pulmonic valve regurgitation is not visualized. Aorta: The aortic root and ascending aorta are structurally normal, with no evidence of dilitation. Venous: The inferior vena cava is normal in size with greater than 50% respiratory variability, suggesting right atrial pressure of 3 mmHg. IAS/Shunts: No atrial level shunt detected by color flow Doppler.  LEFT VENTRICLE PLAX 2D LVIDd:         3.60 cm     Diastology LVIDs:         2.30 cm     LV e' medial:    6.96 cm/s LV PW:         0.90 cm     LV E/e' medial:  12.6 LV IVS:        0.90 cm     LV e' lateral:   12.10 cm/s LVOT diam:     1.90 cm     LV E/e' lateral: 7.2 LV SV:         54 LV SV Index:   35 LVOT Area:     2.84 cm  LV Volumes (MOD) LV vol d, MOD A2C: 59.4 ml LV vol d, MOD A4C: 77.8 ml LV vol s, MOD A2C: 19.3 ml LV vol s, MOD A4C: 31.1  ml LV SV MOD A2C:     40.1 ml LV SV MOD A4C:     77.8 ml LV SV MOD BP:      43.3 ml RIGHT VENTRICLE RV Basal diam:  2.40 cm RV Mid diam:    1.90 cm RV S prime:     17.40 cm/s TAPSE (M-mode): 1.6 cm LEFT ATRIUM             Index        RIGHT ATRIUM          Index LA diam:        2.90 cm 1.88 cm/m   RA Area:     7.82 cm LA Vol (A2C):   32.2 ml 20.86 ml/m  RA Volume:   14.40 ml 9.33 ml/m LA Vol (A4C):   21.3 ml 13.80 ml/m LA Biplane Vol: 26.6 ml 17.23 ml/m  AORTIC VALVE                    PULMONIC VALVE AV Area (Vmax):    2.71 cm     PV Vmax:       1.17 m/s AV Area (Vmean):   2.39 cm     PV Peak  grad:  5.5 mmHg AV Area (VTI):     3.22 cm AV Vmax:           108.65 cm/s AV Vmean:          74.500 cm/s AV VTI:            0.168 m AV Peak Grad:      4.7 mmHg AV Mean Grad:      2.5 mmHg LVOT Vmax:         104.00 cm/s LVOT Vmean:        62.700 cm/s LVOT VTI:          0.191 m LVOT/AV VTI ratio: 1.14  AORTA Ao Root diam: 3.10 cm Ao Asc diam:  3.60 cm MITRAL VALVE MV Area (PHT): 5.13 cm     SHUNTS MV Decel Time: 148 msec     Systemic VTI:  0.19 m MV E velocity: 87.50 cm/s   Systemic Diam: 1.90 cm MV A velocity: 123.00 cm/s MV E/A ratio:  0.71 Mary Scientist, physiological signed by Phineas Inches Signature Date/Time: 05/27/2022/12:29:31 PM    Final    CT TIBIA FIBULA RIGHT WO CONTRAST  Result Date: 05/26/2022 CLINICAL DATA:  Permeative lesion in the mid tibia. History of bladder cancer. EXAM: CT OF THE LOWER RIGHT EXTREMITY WITHOUT CONTRAST TECHNIQUE: Multidetector CT imaging of the right lower extremity was performed according to the standard protocol. RADIATION DOSE REDUCTION: This exam was performed according to the departmental dose-optimization program which includes automated exposure control, adjustment of the mA and/or kV according to patient size and/or use of iterative reconstruction technique. COMPARISON:  Radiographs 05/24/2022 FINDINGS: Bones/Joint/Cartilage CT confirms a poorly defined permeative lesion in the mid to distal diaphysis of the tibia anteriorly with a somewhat moth-eaten appearance of the cortex, substantial irregular periosteal reaction, and endosteal indistinctness in the region of concern. This extends over a an 11.9 cm excursion and primarily involves the anterior, medial, and lateral cortex of the tibial shaft. Hypodensity in the immediately adjacent soft tissues likely from edema, but without a large soft tissue mass component. In addition, there is circumferential periosteal reaction in the distal fibular metadiaphysis. The patient's original fracture in this vicinity was from 2015 and  this degree of periosteal reaction and mildly  moth-eaten cortex would not be a typical postoperative finding from 2015. This is probably a similar process to that involving the tibia and is thought to be acute. Bony demineralization underlying the lateral talar process as on image 62 series 7 and image 283 series 6. Ligaments Suboptimally assessed by CT. Muscles and Tendons Gas tracks along fascia planes adjacent to some of the muscle groups particularly in the knee region. Soft tissues There is substantial gas tracking along fascia planes in the right knee (and in the left knee but only seen in a very limited manner on the left). On prior CT abdomen the patient has subcutaneous emphysema in the chest, anterior and lateral abdominal wall, along the labia, and in the anterior thighs, and presumably the gas visible on today's exam represents that process tracking down. IMPRESSION: 1. Permeative lesion in the mid to distal diaphysis of the right tibia with substantial periosteal reaction and endosteal indistinctness. Similar periosteal reaction and milder permeative moth-eaten lesion in the distal fibular metadiaphysis. Localized edema/fluid along the regions of maximum involvement but no obvious soft tissue mass. 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 2. Bony demineralization underlying the lateral talar process. This could also be an indicator of early osteomyelitis. 3. Gas tracking along fascia planes and subcutaneous tissues in the knee regions bilaterally, likely representing the inferior extent of the subcutaneous emphysema of the chest, abdomen, and thighs shown on CT abdomen imaging. Electronically Signed   By: Van Clines M.D.   On: 05/26/2022 13:01   DG CHEST PORT 1 VIEW  Result Date: 05/24/2022 CLINICAL DATA:  Tachypnea, history of bladder carcinoma EXAM:  PORTABLE CHEST 1 VIEW COMPARISON:  05/23/2022 FINDINGS: Cardiac shadow is stable. Right chest wall port is noted in satisfactory position. Aortic calcifications are seen. Lungs are well aerated bilaterally. No focal infiltrate or sizable effusion is noted. Persistent subcutaneous emphysema is noted related to prior laparoscopy IMPRESSION: No acute abnormality noted. Electronically Signed   By: Inez Catalina M.D.   On: 05/24/2022 17:13   DG Tibia/Fibula Right  Result Date: 05/24/2022 CLINICAL DATA:  Fall.  Right leg pain.  Right ankle pain. EXAM: RIGHT TIBIA AND FIBULA - 2 VIEW; RIGHT ANKLE - COMPLETE 3+ VIEW COMPARISON:  Right ankle radiographs 11/23/2013, 10/27/2013, and 10/10/2013; CT abdomen and pelvis 05/23/2022 FINDINGS: Right ankle: Interval healing of the previously seen oblique distal fibular metadiaphyseal fracture. There is increased periosteal thickening of the anterior aspect of the distal fibular diaphysis, presumably from healing of that fracture. Minimal persistent fracture line lucency is seen in the region of the prior 2015 acute fracture, presumably now the patient's chronic baseline. The ankle mortise is symmetric and intact. Small plantar calcaneal heel spur. No acute fracture is seen. Moderate talonavicular joint space narrowing. Right tibia and fibula: No acute fracture is seen within the more proximal right tibia or fibula. There is mild cortical lucency/thinning of the anterior tibial diaphyseal cortex at mid height. Mild-to-moderate medial compartment of the knee joint space narrowing. There is moderate scattered subcutaneous air about the posteromedial greater than the lateral aspect of the partially visualized distal right thigh. Note is made of extensive subcutaneous anterior abdominopelvic wall emphysema seen extending along the anterior pelvis and bilateral proximal thighs on 05/23/2022 CT, presumably extending down the right thigh to the right knee on the current radiographs.  IMPRESSION: Compared to 11/23/2013 1. Interval healing of previously seen  distal fibular metadiaphyseal fracture. Minimal persistent fracture line lucency is seen in the region of the prior 2015 acute fracture, presumably now the patient's chronic baseline. 2. There is lucency with possible mild "hair on end" periosteal reaction within the anterolateral aspect of the right tibial diaphysis that is nonspecific. This may be secondary to chronic stress related changes or infectious/inflammatory process. This may be subacute to chronic posttraumatic change. It is difficult to exclude an infectious or malignant etiology. CT or MRI of the right tibia may be helpful for further evaluation. Electronically Signed   By: Yvonne Kendall M.D.   On: 05/24/2022 13:47   DG Ankle Complete Right  Result Date: 05/24/2022 CLINICAL DATA:  Fall.  Right leg pain.  Right ankle pain. EXAM: RIGHT TIBIA AND FIBULA - 2 VIEW; RIGHT ANKLE - COMPLETE 3+ VIEW COMPARISON:  Right ankle radiographs 11/23/2013, 10/27/2013, and 10/10/2013; CT abdomen and pelvis 05/23/2022 FINDINGS: Right ankle: Interval healing of the previously seen oblique distal fibular metadiaphyseal fracture. There is increased periosteal thickening of the anterior aspect of the distal fibular diaphysis, presumably from healing of that fracture. Minimal persistent fracture line lucency is seen in the region of the prior 2015 acute fracture, presumably now the patient's chronic baseline. The ankle mortise is symmetric and intact. Small plantar calcaneal heel spur. No acute fracture is seen. Moderate talonavicular joint space narrowing. Right tibia and fibula: No acute fracture is seen within the more proximal right tibia or fibula. There is mild cortical lucency/thinning of the anterior tibial diaphyseal cortex at mid height. Mild-to-moderate medial compartment of the knee joint space narrowing. There is moderate scattered subcutaneous air about the posteromedial greater than the  lateral aspect of the partially visualized distal right thigh. Note is made of extensive subcutaneous anterior abdominopelvic wall emphysema seen extending along the anterior pelvis and bilateral proximal thighs on 05/23/2022 CT, presumably extending down the right thigh to the right knee on the current radiographs. IMPRESSION: Compared to 11/23/2013 1. Interval healing of previously seen distal fibular metadiaphyseal fracture. Minimal persistent fracture line lucency is seen in the region of the prior 2015 acute fracture, presumably now the patient's chronic baseline. 2. There is lucency with possible mild "hair on end" periosteal reaction within the anterolateral aspect of the right tibial diaphysis that is nonspecific. This may be secondary to chronic stress related changes or infectious/inflammatory process. This may be subacute to chronic posttraumatic change. It is difficult to exclude an infectious or malignant etiology. CT or MRI of the right tibia may be helpful for further evaluation. Electronically Signed   By: Yvonne Kendall M.D.   On: 05/24/2022 13:47   CT ABDOMEN PELVIS W CONTRAST  Result Date: 05/23/2022 CLINICAL DATA:  Postoperative abdominal pain. Radical cystectomy and ileal conduit performed on 05/09/2022. Urothelial carcinoma of the bladder. EXAM: CT ABDOMEN AND PELVIS WITH CONTRAST TECHNIQUE: Multidetector CT imaging of the abdomen and pelvis was performed using the standard protocol following bolus administration of intravenous contrast. RADIATION DOSE REDUCTION: This exam was performed according to the departmental dose-optimization program which includes automated exposure control, adjustment of the mA and/or kV according to patient size and/or use of iterative reconstruction technique. CONTRAST:  81m OMNIPAQUE IOHEXOL 300 MG/ML  SOLN COMPARISON:  CT abdomen and pelvis 04/29/2022. CT chest abdomen and pelvis 03/01/2022. FINDINGS: Lower chest: Chest wall emphysema present compatible with  recent surgery. There is a new 5 mm left lower lobe nodule. Hepatobiliary: No focal liver abnormality is seen. No gallstones, gallbladder wall thickening, or  biliary dilatation. Pancreas: Unremarkable. No pancreatic ductal dilatation or surrounding inflammatory changes. Spleen: Normal in size without focal abnormality. Adrenals/Urinary Tract: Patient is status post cystectomy with new right-sided abdominal ileal conduit/urinary diversion. There is mild bilateral hydronephrosis without obstructing calculus. Bilateral ureteral stents are present. There is no perinephric fluid collection or stranding. There is normal renal enhancement bilaterally. The adrenal glands are within normal limits. Stomach/Bowel: Appendix is not seen. No evidence of bowel wall thickening, distention, or inflammatory changes. There is a moderate-sized hiatal hernia. Stomach is nondilated. There is sigmoid colon diverticulosis. Vascular/Lymphatic: Aortic atherosclerosis. No enlarged abdominal or pelvic lymph nodes. Reproductive: Status post hysterectomy. No adnexal masses. Other: There is extensive subcutaneous abdominal and pelvic wall emphysema compatible with recent surgery. There is no abdominal wall hernia. No focal fluid collections are identified. Musculoskeletal: Degenerative changes affect the spine. IMPRESSION: 1. Patient is status post cystectomy with right-sided ileal conduit/urinary diversion. There is mild bilateral hydronephrosis without obstructing calculus. Bilateral ureteral stents are in place. 2. Extensive subcutaneous emphysema compatible with recent surgery. 3. Moderate-sized hiatal hernia. 4. Aortic atherosclerosis. 5. There is a new 5 mm left lower lobe nodule. Metastatic disease not excluded. Aortic Atherosclerosis (ICD10-I70.0). Electronically Signed   By: Ronney Asters M.D.   On: 05/23/2022 21:20   DG Chest 2 View  Result Date: 05/23/2022 CLINICAL DATA:  Suspected sepsis. Weakness. Patient had robotic assisted  laparoscopic cystectomy with ileal conduit and pelvic lymph node dissection on 05/09/2022. EXAM: CHEST - 2 VIEW COMPARISON:  03/01/2022 FINDINGS: Patient has RIGHT-sided PowerPort, tip overlying the superior vena cava. Heart size is normal. The lungs are free of focal consolidations and pleural effusions. There is no pulmonary edema. There is subcutaneous emphysema in the soft tissues of the UPPER abdomen, likely related to recent laparoscopic procedure. IMPRESSION: 1. No evidence for acute cardiopulmonary abnormality. 2. Subcutaneous emphysema in the UPPER abdomen, likely related to recent laparoscopic procedure. Consider CT of the abdomen and pelvis as needed. Electronically Signed   By: Nolon Nations M.D.   On: 05/23/2022 16:13      Subjective:   Discharge Exam: Vitals:   06/05/22 0535 06/05/22 2206  BP: (!) 130/52 (!) 130/48  Pulse: (!) 107 (!) 105  Resp: 16   Temp: 97.8 F (36.6 C) 98.6 F (37 C)  SpO2: 96% 95%   Vitals:   06/04/22 0513 06/04/22 2021 06/05/22 0535 06/05/22 2206  BP: (!) 143/51 (!) 120/51 (!) 130/52 (!) 130/48  Pulse: (!) 107 (!) 110 (!) 107 (!) 105  Resp: 16 15 16   $ Temp: 98 F (36.7 C) 99 F (37.2 C) 97.8 F (36.6 C) 98.6 F (37 C)  TempSrc: Oral Oral Oral Oral  SpO2: 97% 97% 96% 95%  Weight: 57.9 kg  56.4 kg   Height:        General: Pt is alert, awake, not in acute distress Cardiovascular: RRR, S1/S2 +, no rubs, no gallops Respiratory: CTA bilaterally, no wheezing, no rhonchi Abdominal: Soft, NT, ND, bowel sounds + Extremities: no edema, no cyanosis    The results of significant diagnostics from this hospitalization (including imaging, microbiology, ancillary and laboratory) are listed below for reference.     Microbiology: Recent Results (from the past 240 hour(s))  Aerobic/Anaerobic Culture w Gram Stain (surgical/deep wound)     Status: None   Collection Time: 05/29/22  3:10 PM   Specimen: Bone; Tissue  Result Value Ref Range Status    Specimen Description   Final    BONE BIOPSY  TIBIA RIGHT Performed at [facility], [place] [address] [place], [place] [address]    Special Requests   Final    NONE Performed at [facility], [place] [address] [place], [place] [address]    Gram Stain NO WBC SEEN NO ORGANISMS SEEN   Final   Culture   Final    No growth aerobically or anaerobically. Performed at [facility], [place] [address], [place], [place] [address]    Report Status 06/03/2022 FINAL  Final     Labs: BNP (last 3 results) No results for input(s): "BNP" in the last 8760 hours. Basic Metabolic Panel: Recent Labs  Lab 06/01/22 0942 06/02/22 0623  NA 132* 134*  K 3.0* 3.0*  CL 109 106  CO2 19* 19*  GLUCOSE 130* 106*  BUN 13 14  CREATININE 0.53 0.60  CALCIUM 6.7* 6.7*   Liver Function Tests: No results for input(s): "AST", "ALT", "ALKPHOS", "BILITOT", "PROT", "ALBUMIN" in the last 168 hours. No results for input(s): "LIPASE", "AMYLASE" in the last 168 hours. No results for input(s): "AMMONIA" in the last 168 hours. CBC: Recent Labs  Lab 05/30/22 2215 05/31/22 0602 06/01/22 0942  WBC  --  10.7* 11.8*  HGB 8.4* 8.4* 9.0*  HCT 26.6* 26.6* 28.9*  MCV  --  84.4 84.0  PLT  --  215 231   Cardiac Enzymes: No results for input(s): "CKTOTAL", "CKMB", "CKMBINDEX", "TROPONINI" in the last 168 hours. BNP: Invalid input(s): "POCBNP" CBG: Recent Labs  Lab 06/01/22 0737 06/01/22 1124 06/03/22 0745 06/03/22 1158 06/03/22 1619  GLUCAP 137* 128* 120* 122* 114*   D-Dimer No results for input(s): "DDIMER" in the last 72 hours. Hgb A1c No results for input(s): "HGBA1C" in the last 72 hours. Lipid Profile No results for input(s): "CHOL", "HDL", "LDLCALC", "TRIG", "CHOLHDL", "LDLDIRECT" in the last 72 hours. Thyroid function studies No results for input(s): "TSH", "T4TOTAL", "T3FREE", "THYROIDAB" in the last 72 hours.  Invalid input(s): "FREET3" Anemia work up No results for  input(s): "VITAMINB12", "FOLATE", "FERRITIN", "TIBC", "IRON", "RETICCTPCT" in the last 72 hours. Urinalysis    Component Value Date/Time   COLORURINE YELLOW 05/23/2022 1550   APPEARANCEUR CLOUDY (A) 05/23/2022 1550   LABSPEC 1.013 05/23/2022 1550   PHURINE 7.0 05/23/2022 1550   GLUCOSEU NEGATIVE 05/23/2022 1550   HGBUR MODERATE (A) 05/23/2022 1550   BILIRUBINUR NEGATIVE 05/23/2022 1550   KETONESUR NEGATIVE 05/23/2022 1550   PROTEINUR 100 (A) 05/23/2022 1550   NITRITE POSITIVE (A) 05/23/2022 1550   LEUKOCYTESUR LARGE (A) 05/23/2022 1550   Sepsis Labs Recent Labs  Lab 05/31/22 0602 06/01/22 0942  WBC 10.7* 11.8*   Microbiology Recent Results (from the past 240 hour(s))  Aerobic/Anaerobic Culture w Gram Stain (surgical/deep wound)     Status: None   Collection Time: 05/29/22  3:10 PM   Specimen: Bone; Tissue  Result Value Ref Range Status   Specimen Description   Final    BONE BIOPSY TIBIA RIGHT Performed at [facility], [place] [address], [place], [place] [address]    Special Requests   Final    NONE Performed at [facility], [place] [address], [place], [place] [address]    Gram Stain NO WBC SEEN NO ORGANISMS SEEN   Final   Culture   Final    No growth aerobically or anaerobically. Performed at [facility], [place] [address], [place], [place] [address]    Report Status 06/03/2022 FINAL  Final  Please note: You were cared for by a hospitalist during your hospital stay. Once you are discharged, your primary care physician will handle any further medical issues. Please note that NO REFILLS for any discharge medications will be authorized once you are discharged, as it is imperative that you return to your primary care physician (or establish a relationship with a primary care physician if you do not have one) for your post hospital discharge needs so that they can reassess your need for medications and monitor your lab values.    Time  coordinating discharge: 40 minutes  SIGNED:   Shelly Coss, MD  [facility] 06/06/2022, 10:00 AM Pager [phone]  If 7PM-7AM, please contact night-coverage [email or web address] Password [email or web address]

## 2022-06-06 NOTE — Consult Note (Addendum)
[place] Nurse ostomy follow up Pt plans to discharge home to day with hospice assistance.  No family in the room at this time.  Current urostomy pouch is intact with good seal, mod amt cloudy urine.  Stoma is red and viable when visualized through the pouch.  4 sets of each supply left in the room for use after discharge: use supplies: barrier ring, Kellie Simmering # [phone] and one piece convex pouch Kellie Simmering # [phone] Enrolled patient in [place] Discharge program: Yes, previously Requested bedside nurse send a Secure chat message to me if daughter arrives to determine if she would like to perform another pouch change and teaching session prior to discharge.  Post note 12:00: Daughter at the bedside.  Performed pouch change and she was able to shape the barrier ring and apply to the back of the pouch and apply pouch with minimal assistance.  She is able to open and close the spout to empty and attach and remove the bedside drainage bag without assistance.  Reviewed pouching routines and ordering supplies. Tape applied to edges to hold in place where they lift up at times, since currently daughter does not want to apply an ostomy belt.  Demonstrated use of belt if use if desired and sent home with patient, along with skin prep wipes and ostomy powder.  Stoma is red and viable, 1 inch, slightly above skin level, 2 stints in place.  Red moist maceration to 2 cm surrounding the stoma and also to left outer abd where previous pouches have leaked and it is slowly resolving.  Demonstrated use of skin prep wipes and powder to perform crusting technique to protect the skin and promote healing.  Daughter denies further questions.  Thank-you,  Julien Girt MSN, [place], [place], [place], [place]

## 2022-06-07 ENCOUNTER — Other Ambulatory Visit (HOSPITAL_COMMUNITY): Payer: Self-pay

## 2022-06-07 ENCOUNTER — Encounter: Payer: Self-pay | Admitting: Hematology

## 2022-06-13 ENCOUNTER — Other Ambulatory Visit: Payer: Medicare HMO

## 2022-06-13 ENCOUNTER — Ambulatory Visit: Payer: Medicare HMO | Admitting: Hematology

## 2022-06-13 ENCOUNTER — Ambulatory Visit: Payer: Medicare HMO | Admitting: Oncology

## 2022-06-22 ENCOUNTER — Inpatient Hospital Stay: Payer: Medicare HMO | Admitting: Hematology

## 2022-06-22 ENCOUNTER — Inpatient Hospital Stay: Payer: Medicare HMO

## 2022-07-23 DEATH — deceased

## 2023-08-08 IMAGING — CT CT RENAL STONE PROTOCOL
2 of 4 series · 15 of 46 positions shown, 17 images · non-contrast
Comparison: CT of the abdomen and pelvis 04/27/2020.

CLINICAL DATA: 89-year-old female with history of left lower
quadrant abdominal pain and flank pain. Vaginal bleeding. Urinary
urgency.



[Series 2: axial st · axial · 0.96mm/px · z∈[+786,+1191]mm · 12 of 97 slices shown, 14 images]
[im 8/97  soft-tissue]
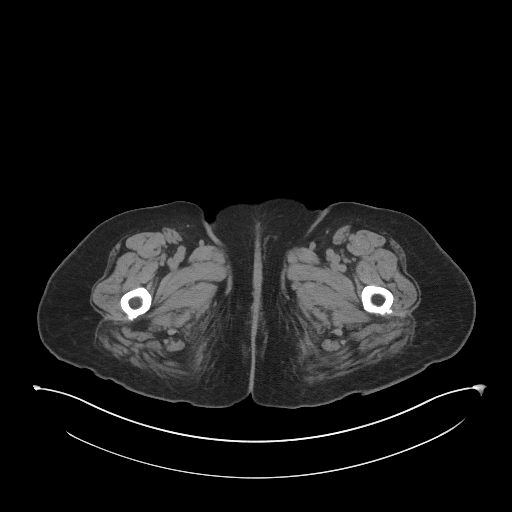
[im 8/97  bone]
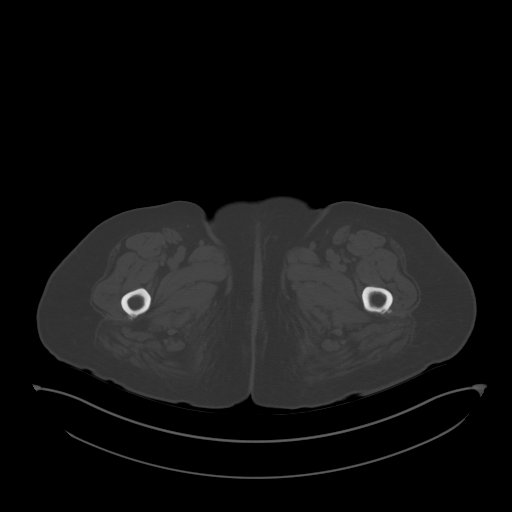
[im 15/97  soft-tissue]
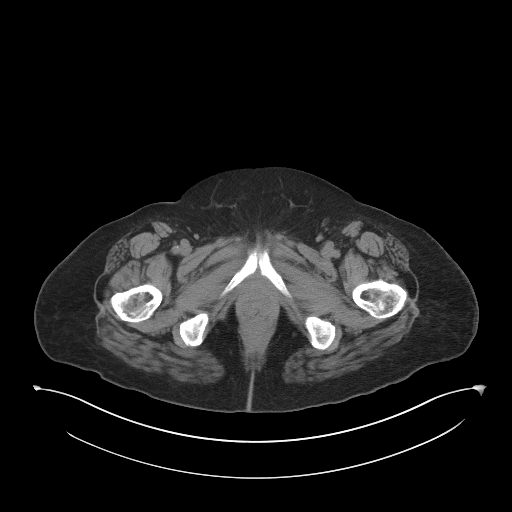
[im 23/97  soft-tissue]
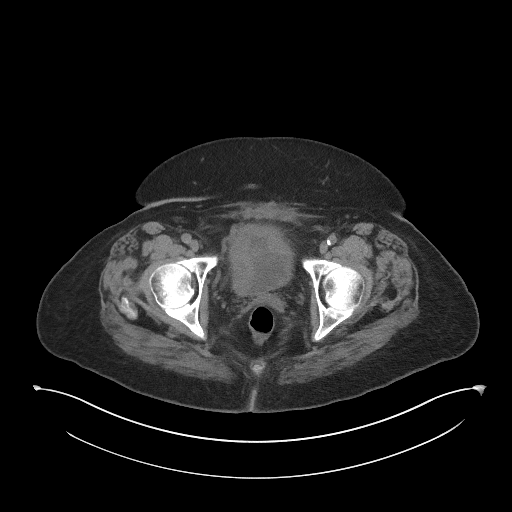
[im 30/97  soft-tissue]
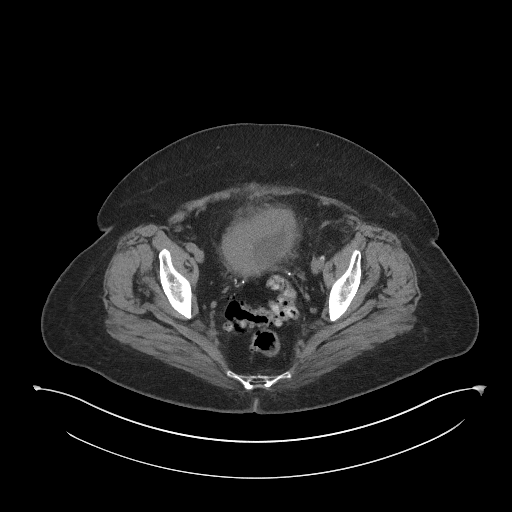
[im 37/97  soft-tissue]
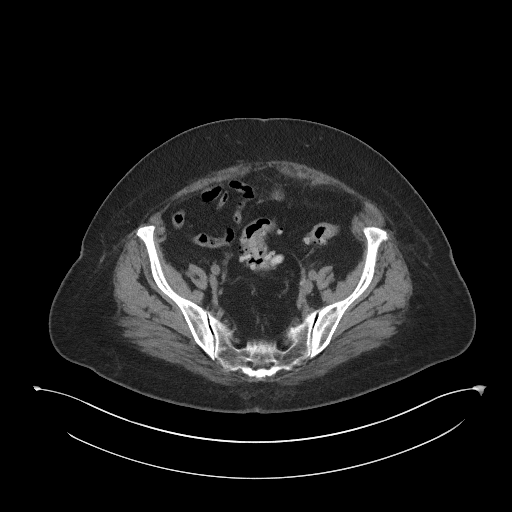
[im 45/97  soft-tissue]
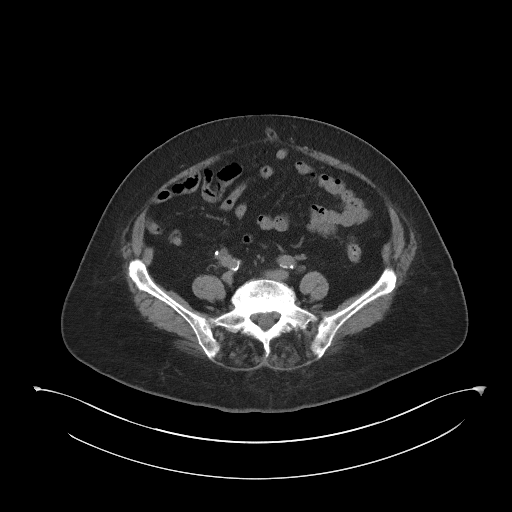
[im 52/97  soft-tissue]
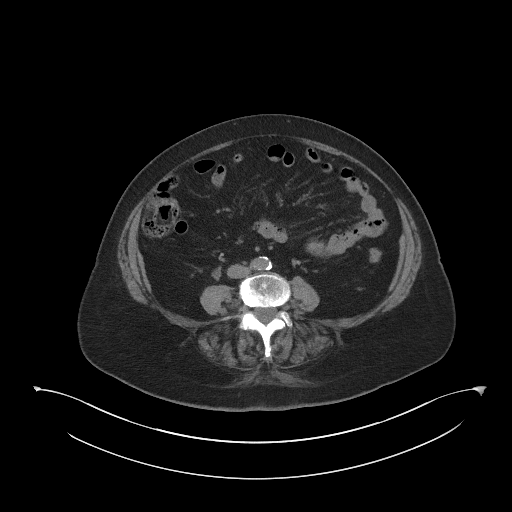
[im 60/97  soft-tissue]
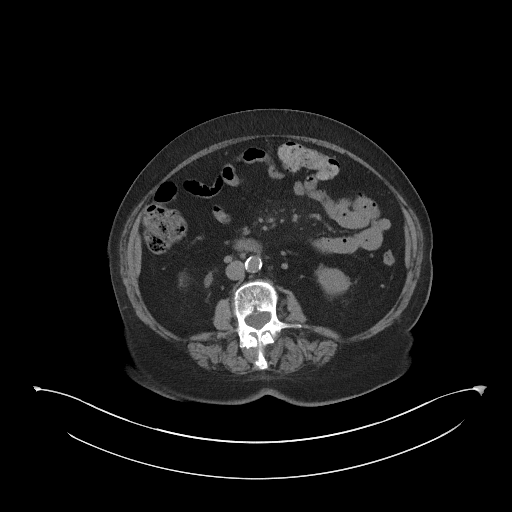
[im 67/97  soft-tissue]
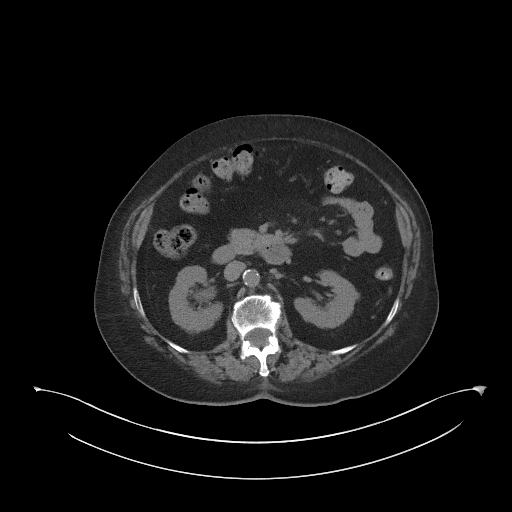
[im 67/97  bone]
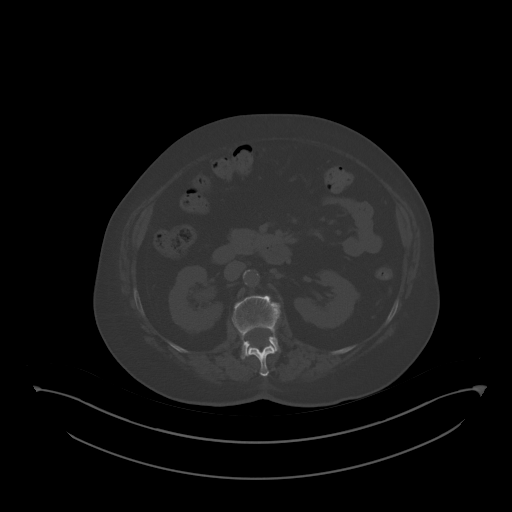
[im 74/97  soft-tissue]
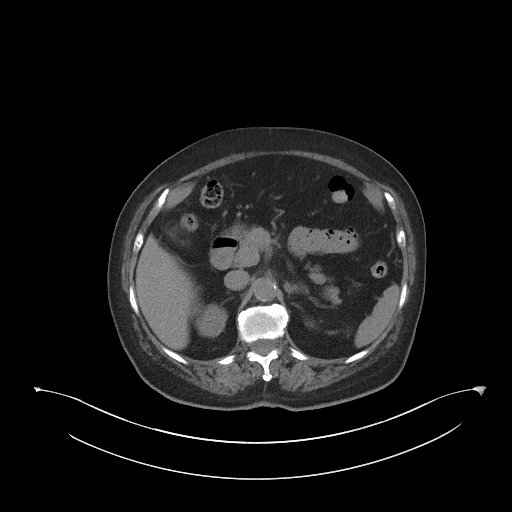
[im 82/97  soft-tissue]
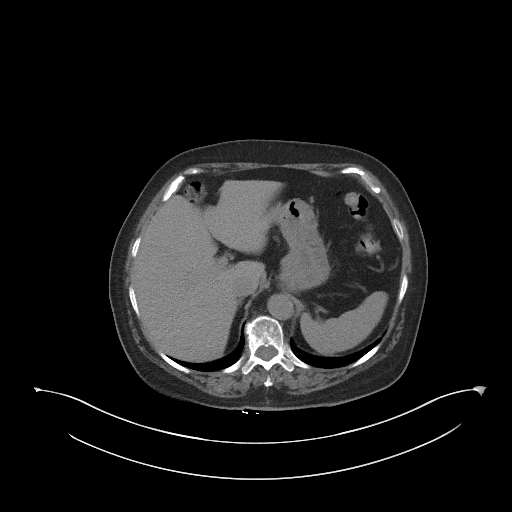
[im 89/97  soft-tissue]
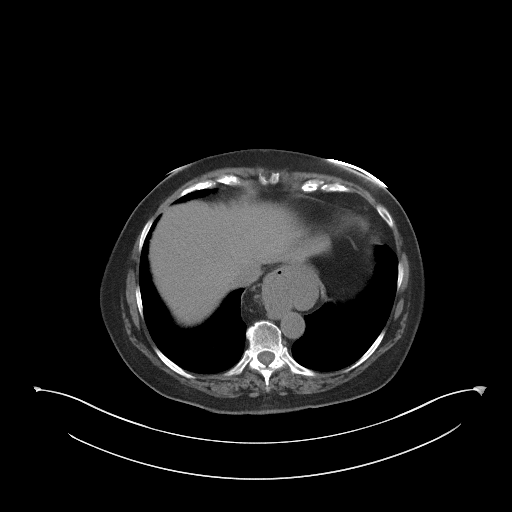

[Series 5: coronal st · coronal · 0.74mm/px · 3 of 92 slices shown]
[im 31/92  soft-tissue]
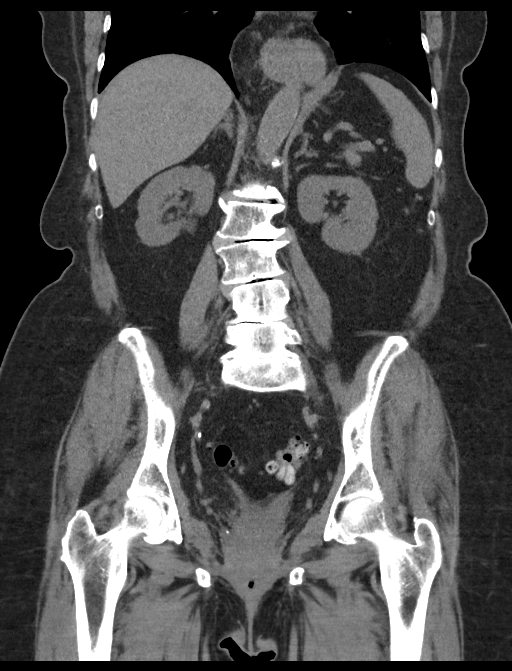
[im 41/92  soft-tissue]
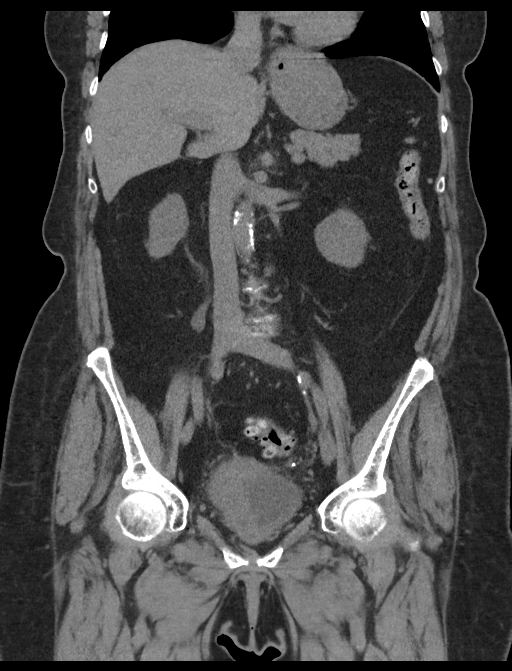
[im 51/92  soft-tissue]
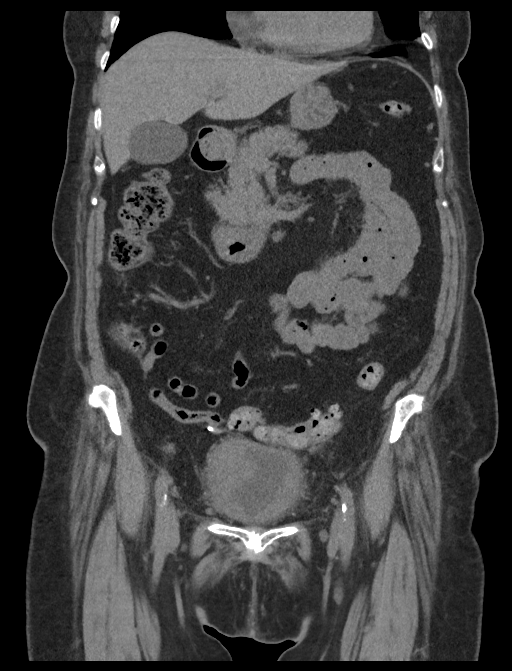

[15 of 46 positions shown; findings below may reference images not displayed]

FINDINGS: Lower chest: Moderate-sized hiatal hernia.  Aortic atherosclerosis.

Hepatobiliary: No suspicious cystic or solid hepatic lesions are
confidently identified on today's noncontrast CT examination. Tiny
calcified gallstones are noted lying dependently in the gallbladder.
Gallbladder is moderately distended. No pericholecystic fluid or
inflammatory changes.

Pancreas: No definite pancreatic mass or peripancreatic fluid
collections or inflammatory changes are noted on today's noncontrast
CT examination.

Spleen: Unremarkable.

Adrenals/Urinary Tract: Mild right hydroureteronephrosis. Unenhanced
appearance of the kidneys and bilateral adrenal glands is otherwise
normal. Profound mass-like asymmetric bladder wall thickening, most
severe anteriorly and along the right-side of the urinary bladder,
particularly adjacent to the right ureterovesicular junction,
presumably causing obstruction. Haziness in the perivesical fat
suggesting local infiltration of malignancy.

Stomach/Bowel: Unenhanced appearance of the intra-abdominal portion
of the stomach is unremarkable. No pathologic dilatation of small
bowel or colon. Numerous colonic diverticulae are noted,
particularly in the sigmoid colon, without surrounding inflammatory
changes to suggest an acute diverticulitis at this time. The
appendix is not confidently identified and may be surgically absent.
Regardless, there are no inflammatory changes noted adjacent to the
cecum to suggest the presence of an acute appendicitis at this time.

Vascular/Lymphatic: Atherosclerotic calcifications are noted in the
abdominal aorta and pelvic vasculature. No definite lymphadenopathy
noted in the abdomen or pelvis.

Reproductive: Status post hysterectomy. Ovaries are not confidently
identified may be surgically absent or atrophic.

Other: No significant volume of ascites.  No pneumoperitoneum.

Musculoskeletal: There are no aggressive appearing lytic or blastic
lesions noted in the visualized portions of the skeleton.
IMPRESSION: 1. Profound mass-like thickening of the urinary bladder wall, as
above, highly concerning for primary urothelial neoplasm. This
appears to be causing obstruction at the right ureterovesicular
junction with mild right hydroureteronephrosis. Urologic
consultation is recommended at this time. No definite
lymphadenopathy confidently identified in the abdomen or pelvis,
although there is extensive haziness in the perivesical fat
suggesting local infiltration of disease.
2. Cholelithiasis without evidence of acute cholecystitis.
3. Colonic diverticulosis without evidence of acute diverticulitis
at this time.
4. Aortic atherosclerosis.
5. Moderate-sized hiatal hernia.
6. Additional incidental findings, as above.
